# Patient Record
Sex: Male | Born: 1960 | Race: White | Hispanic: No | Marital: Married | Smoking: Current every day smoker
Health system: Southern US, Community
[De-identification: ages and names within clinical notes are randomized; demographics above are authoritative.]

## PROBLEM LIST (undated history)

## (undated) DIAGNOSIS — L570 Actinic keratosis: Secondary | ICD-10-CM

## (undated) DIAGNOSIS — K449 Diaphragmatic hernia without obstruction or gangrene: Secondary | ICD-10-CM

## (undated) DIAGNOSIS — K219 Gastro-esophageal reflux disease without esophagitis: Secondary | ICD-10-CM

## (undated) DIAGNOSIS — K648 Other hemorrhoids: Secondary | ICD-10-CM

## (undated) DIAGNOSIS — E785 Hyperlipidemia, unspecified: Secondary | ICD-10-CM

## (undated) DIAGNOSIS — K2970 Gastritis, unspecified, without bleeding: Secondary | ICD-10-CM

## (undated) DIAGNOSIS — T7840XA Allergy, unspecified, initial encounter: Secondary | ICD-10-CM

## (undated) DIAGNOSIS — F329 Major depressive disorder, single episode, unspecified: Secondary | ICD-10-CM

## (undated) DIAGNOSIS — N200 Calculus of kidney: Secondary | ICD-10-CM

## (undated) DIAGNOSIS — C44310 Basal cell carcinoma of skin of unspecified parts of face: Secondary | ICD-10-CM

## (undated) DIAGNOSIS — J449 Chronic obstructive pulmonary disease, unspecified: Secondary | ICD-10-CM

## (undated) DIAGNOSIS — F17200 Nicotine dependence, unspecified, uncomplicated: Secondary | ICD-10-CM

## (undated) DIAGNOSIS — M5020 Other cervical disc displacement, unspecified cervical region: Secondary | ICD-10-CM

## (undated) DIAGNOSIS — D126 Benign neoplasm of colon, unspecified: Secondary | ICD-10-CM

## (undated) DIAGNOSIS — J439 Emphysema, unspecified: Secondary | ICD-10-CM

## (undated) HISTORY — DX: Actinic keratosis: L57.0

## (undated) HISTORY — DX: Chronic obstructive pulmonary disease, unspecified: J44.9

## (undated) HISTORY — DX: Major depressive disorder, single episode, unspecified: F32.9

## (undated) HISTORY — DX: Basal cell carcinoma of skin of unspecified parts of face: C44.310

## (undated) HISTORY — PX: POLYPECTOMY: SHX149

## (undated) HISTORY — DX: Emphysema, unspecified: J43.9

## (undated) HISTORY — DX: Hyperlipidemia, unspecified: E78.5

## (undated) HISTORY — PX: MOHS SURGERY: SUR867

## (undated) HISTORY — DX: Nicotine dependence, unspecified, uncomplicated: F17.200

## (undated) HISTORY — DX: Calculus of kidney: N20.0

## (undated) HISTORY — DX: Gastritis, unspecified, without bleeding: K29.70

## (undated) HISTORY — DX: Benign neoplasm of colon, unspecified: D12.6

## (undated) HISTORY — DX: Gastro-esophageal reflux disease without esophagitis: K21.9

## (undated) HISTORY — PX: TONSILLECTOMY: SUR1361

## (undated) HISTORY — DX: Other cervical disc displacement, unspecified cervical region: M50.20

## (undated) HISTORY — DX: Other hemorrhoids: K64.8

## (undated) HISTORY — DX: Allergy, unspecified, initial encounter: T78.40XA

## (undated) HISTORY — DX: Diaphragmatic hernia without obstruction or gangrene: K44.9

---

## 2001-02-13 DIAGNOSIS — K2970 Gastritis, unspecified, without bleeding: Secondary | ICD-10-CM

## 2001-02-13 HISTORY — DX: Gastritis, unspecified, without bleeding: K29.70

## 2003-02-14 ENCOUNTER — Encounter (INDEPENDENT_AMBULATORY_CARE_PROVIDER_SITE_OTHER): Payer: Self-pay | Admitting: Gastroenterology

## 2003-02-14 HISTORY — PX: COLONOSCOPY: SHX174

## 2003-02-14 HISTORY — PX: ESOPHAGOGASTRODUODENOSCOPY: SHX1529

## 2005-05-26 ENCOUNTER — Ambulatory Visit: Payer: Self-pay | Admitting: Gastroenterology

## 2006-06-15 ENCOUNTER — Ambulatory Visit: Payer: Self-pay | Admitting: Gastroenterology

## 2006-08-30 ENCOUNTER — Ambulatory Visit: Payer: Self-pay | Admitting: Family Medicine

## 2007-05-01 ENCOUNTER — Ambulatory Visit: Payer: Self-pay | Admitting: Family Medicine

## 2007-05-01 DIAGNOSIS — F17200 Nicotine dependence, unspecified, uncomplicated: Secondary | ICD-10-CM

## 2007-05-01 DIAGNOSIS — H6120 Impacted cerumen, unspecified ear: Secondary | ICD-10-CM

## 2007-08-10 HISTORY — PX: COLONOSCOPY: SHX174

## 2007-09-29 ENCOUNTER — Ambulatory Visit: Payer: Self-pay | Admitting: Gastroenterology

## 2008-01-11 ENCOUNTER — Telehealth: Payer: Self-pay | Admitting: Gastroenterology

## 2008-02-07 DIAGNOSIS — D126 Benign neoplasm of colon, unspecified: Secondary | ICD-10-CM

## 2008-02-07 HISTORY — DX: Benign neoplasm of colon, unspecified: D12.6

## 2008-02-16 ENCOUNTER — Ambulatory Visit: Payer: Self-pay | Admitting: Gastroenterology

## 2008-02-29 ENCOUNTER — Telehealth: Payer: Self-pay | Admitting: Gastroenterology

## 2008-03-01 ENCOUNTER — Encounter: Payer: Self-pay | Admitting: Gastroenterology

## 2008-03-01 ENCOUNTER — Ambulatory Visit: Payer: Self-pay | Admitting: Gastroenterology

## 2008-03-01 LAB — HM COLONOSCOPY

## 2008-03-05 ENCOUNTER — Encounter: Payer: Self-pay | Admitting: Gastroenterology

## 2008-08-06 ENCOUNTER — Ambulatory Visit: Payer: Self-pay | Admitting: Family Medicine

## 2008-08-06 DIAGNOSIS — J069 Acute upper respiratory infection, unspecified: Secondary | ICD-10-CM | POA: Insufficient documentation

## 2008-08-11 ENCOUNTER — Telehealth: Payer: Self-pay | Admitting: Internal Medicine

## 2008-08-12 ENCOUNTER — Telehealth (INDEPENDENT_AMBULATORY_CARE_PROVIDER_SITE_OTHER): Payer: Self-pay | Admitting: Internal Medicine

## 2009-02-04 ENCOUNTER — Ambulatory Visit: Payer: Self-pay | Admitting: Family Medicine

## 2009-02-04 DIAGNOSIS — G4700 Insomnia, unspecified: Secondary | ICD-10-CM

## 2009-04-08 ENCOUNTER — Telehealth: Payer: Self-pay | Admitting: Gastroenterology

## 2009-05-01 ENCOUNTER — Telehealth: Payer: Self-pay | Admitting: Gastroenterology

## 2009-05-23 ENCOUNTER — Ambulatory Visit: Payer: Self-pay | Admitting: Gastroenterology

## 2009-05-23 DIAGNOSIS — Z860100 Personal history of colon polyps, unspecified: Secondary | ICD-10-CM | POA: Insufficient documentation

## 2009-05-23 DIAGNOSIS — K219 Gastro-esophageal reflux disease without esophagitis: Secondary | ICD-10-CM

## 2009-06-13 ENCOUNTER — Ambulatory Visit: Payer: Self-pay | Admitting: Family Medicine

## 2009-06-21 ENCOUNTER — Telehealth: Payer: Self-pay | Admitting: Family Medicine

## 2009-09-10 ENCOUNTER — Telehealth: Payer: Self-pay | Admitting: Gastroenterology

## 2009-10-08 ENCOUNTER — Ambulatory Visit: Payer: Self-pay | Admitting: Family Medicine

## 2009-10-08 DIAGNOSIS — F4321 Adjustment disorder with depressed mood: Secondary | ICD-10-CM | POA: Insufficient documentation

## 2009-10-08 LAB — CONVERTED CEMR LAB: Rapid Strep: NEGATIVE

## 2009-11-12 ENCOUNTER — Ambulatory Visit: Payer: Self-pay | Admitting: Family Medicine

## 2010-04-21 ENCOUNTER — Ambulatory Visit: Payer: Self-pay | Admitting: Family Medicine

## 2010-04-21 LAB — CONVERTED CEMR LAB
ALT: 19 units/L (ref 0–53)
AST: 20 units/L (ref 0–37)
Albumin: 3.8 g/dL (ref 3.5–5.2)
Basophils Relative: 0.6 % (ref 0.0–3.0)
CO2: 25 meq/L (ref 19–32)
Calcium: 9.3 mg/dL (ref 8.4–10.5)
Cholesterol: 176 mg/dL (ref 0–200)
Creatinine, Ser: 0.9 mg/dL (ref 0.4–1.5)
Eosinophils Relative: 2.7 % (ref 0.0–5.0)
GFR calc non Af Amer: 97 mL/min (ref >60)
Lymphocytes Relative: 27.4 % (ref 12.0–46.0)
Lymphs Abs: 2.4 10*3/uL (ref 0.7–4.0)
MCHC: 34.3 g/dL (ref 30.0–36.0)
Monocytes Absolute: 0.5 10*3/uL (ref 0.1–1.0)
Monocytes Relative: 6.2 % (ref 3.0–12.0)
Neutro Abs: 5.4 10*3/uL (ref 1.4–7.7)
Neutrophils Relative %: 63.1 % (ref 43.0–77.0)
PSA: 0.25 ng/mL (ref 0.10–4.00)
RBC: 4.6 M/uL (ref 4.22–5.81)
RDW: 13.4 % (ref 11.5–14.6)
TSH: 0.98 microintl units/mL (ref 0.35–5.50)
Total Bilirubin: 0.7 mg/dL (ref 0.3–1.2)
Total Protein: 6.5 g/dL (ref 6.0–8.3)

## 2010-04-28 ENCOUNTER — Ambulatory Visit: Payer: Self-pay | Admitting: Family Medicine

## 2010-06-25 ENCOUNTER — Telehealth: Payer: Self-pay | Admitting: Family Medicine

## 2010-07-06 ENCOUNTER — Telehealth: Payer: Self-pay | Admitting: Gastroenterology

## 2010-08-31 ENCOUNTER — Ambulatory Visit
Admission: RE | Admit: 2010-08-31 | Discharge: 2010-08-31 | Payer: Self-pay | Source: Home / Self Care | Attending: Gastroenterology | Admitting: Gastroenterology

## 2010-09-03 ENCOUNTER — Encounter: Payer: Self-pay | Admitting: Gastroenterology

## 2010-09-08 NOTE — Progress Notes (Signed)
Summary: Aciphex discount card   Phone Note Call from Patient Call back at Home Phone [phone]   Call For: Dr Russella Dar Summary of Call: Would like an aciphex discount card please.  Initial call taken by: Leanor Kail [facility],  September 10, 2009 1:49 PM  Follow-up for Phone Call        Pt would like Aciphex discount card. Card left up front for pt to pick up. Follow-up by: Christie Nottingham CMA [facility]),  September 10, 2009 1:59 PM
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
## 2010-09-08 NOTE — Progress Notes (Signed)
Summary: wants claritin called in  Phone Note Refill Request Message from:  wife  Refills Requested: Medication #1:  CLARITIN 10 MG TABS as needed Pt is asking that a script for this be sent to [facility].  He takes it every day and has a flex spending account that he would like to put it on.  Initial call taken by: Lowella Petties CMA, AAMA,  June 25, 2010 9:02 AM  Follow-up for Phone Call        no problem px written on EMR for call in  Follow-up by: Judith Part MD,  June 25, 2010 12:05 PM  Additional Follow-up for Phone Call Additional follow up Details #1::        Medication phoned to [facility] pharmacy as instructed. Patient notified as instructed by telephone v/m. Lewanda Rife LPN  June 25, 2010 12:43 PM     New/Updated Medications: CLARITIN 10 MG TABS (LORATADINE) 1 by mouth once daily as needed allergy symptoms Prescriptions: CLARITIN 10 MG TABS (LORATADINE) 1 by mouth once daily as needed allergy symptoms  #30 x 11   Entered and Authorized by:   Judith Part MD   Signed by:   Lewanda Rife LPN on 78/46/9629   Method used:   Telephoned to ...       [facility]  [address] (retail[facility]       [address]       [place], [place]  [address]       Ph: [phone] or [phone]       Fax: [phone]   RxID:   [id number]

## 2010-09-08 NOTE — Progress Notes (Signed)
Summary: Medication  Medications Added ACIPHEX 20 MG  TBEC (RABEPRAZOLE SODIUM) Take 1 tablet by mouth once a day       Phone Note Call from Patient Call back at [phone]   Caller: Patient Reason for Call: Talk to Nurse Summary of Call: Pts wife called about refill on a Aciphex, looks like in the chart it was denied becuase he needs an appt so she wants to discuss getting a amall refill untill he can come in for appt Initial call taken by: Raechel Chute,  July 06, 2010 1:54 PM  Follow-up for Phone Call        Rx was sent to pts pharmacy and pt scheduled a appt for 08/31/10 at 2:45pm.  Follow-up by: Christie Nottingham CMA [facility]),  July 06, 2010 2:24 PM    New/Updated Medications: ACIPHEX 20 MG  TBEC (RABEPRAZOLE SODIUM) Take 1 tablet by mouth once a day Prescriptions: ACIPHEX 20 MG  TBEC (RABEPRAZOLE SODIUM) Take 1 tablet by mouth once a day  #30 x 1   Entered by:   Christie Nottingham CMA (AAMA)   Authorized by:   Meryl Dare MD [facility]   Signed by:   Christie Nottingham CMA (AAMA) on 07/06/2010   Method used:   [facility]        [facility]  [address] (ret[facility]       [address]       [place], [place]  [address]       Ph: [phone] or [phone]       Fax: [phone]   RxID:   [id number]
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
## 2010-09-08 NOTE — Assessment & Plan Note (Signed)
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 Bili:  0.7 (04/21/2010 8:53:45 AM)Alk Phos:  96 (04/21/2010 8:53:45 AM)  does take fish oil -- two times a day    psa nl at spent 25 minutes face to face time with pt , over 50% of which was spent on counseling and coordination of care   Td 04  flu shot - will get at work   some wax in ears -- may aff his hearing   Allergies: 1)  Aspirin 2)  Codeine Sulfate (Codeine Sulfate)  Past History:  Past Surgical History: Last updated: 04/19/2007 EGD- esophagitis, gastritis, duodenitis without hemorrhage 02/14/03 Colonoscopy, polyps, ext. hemorrhoids 02/14/03  Family History: Last updated: 04/19/2007 Father: Alive, elevated BP, gout, GERD, colon polyps Mother: Alive- breast cancer Siblings: 2 Brothers, L & W, one Colon polyps, earyl glaucoma CV + PGF - MI deceased (14's), MGF - Mini  CVA's DM - none Prostate cancer - none Breast cancer + Mom Colon cancer - none Depression - none ETOH/Drug Abuse - none  Social History: Last updated: 10/08/2009 Marital Status: Married Children: 0 Occupation: Best boy Drug Use - no Patient currently smokes. about a pack per day  Alcohol Use - no  Risk Factors: Smoking Status: current (05/23/2009) Packs/Day: 1/2 (08/06/2008)  Past Medical History: Adenomatous colon polyp 02/2008 esophagitis, gastritis--02/13/01 Hiatal hernia GERD Hemorrhoids depression- situational  basal cell lesions face - Mohs proceedure  AKs    derm  Review of Systems General:  Denies fatigue, loss of appetite, and malaise. Eyes:  Denies blurring and eye irritation. CV:  Denies chest pain or discomfort and lightheadness. Resp:  Denies cough, shortness of breath, and wheezing. GI:  Denies abdominal pain, change in bowel habits, indigestion, and nausea. GU:  Denies dysuria, nocturia, urinary frequency, and urinary hesitancy. MS:  Denies joint pain, joint redness, and joint swelling. Derm:  Denies itching, lesion(s), poor wound healing, and rash. Neuro:  Denies numbness and tingling. Psych:  Denies anxiety and depression. Endo:  Denies excessive thirst and excessive urination. Heme:  Denies abnormal bruising and bleeding.  Physical Exam  General:  Well-developed,well-nourished,in no acute distress; alert,appropriate and cooperative throughout examination Head:  normocephalic, atraumatic, and no abnormalities observed.   Eyes:  vision grossly intact, pupils equal, pupils round, and pupils reactive to light.   Ears:  bilat dry cerumen impaction- is deep in ears  some decreased hearing  Nose:  no nasal discharge.   Mouth:  pharynx pink and moist.   Neck:  supple with full rom and no masses or thyromegally, no JVD or carotid bruit  Chest Wall:  No deformities, masses, tenderness or gynecomastia noted. Lungs:  Normal  respiratory effort, chest expands symmetrically. Lungs are clear to auscultation, no crackles or wheezes. Heart:  normal rate, regular rhythm, and no murmur.   Abdomen:  Bowel sounds positive,abdomen soft and non-tender without masses, organomegaly or hernias noted. no renal bruits  Rectal:  No external abnormalities noted. Normal sphincter tone. No rectal masses or tenderness. Prostate:  Prostate gland firm and smooth, no enlargement, nodularity, tenderness, mass, asymmetry or induration. Msk:  No deformity or scoliosis noted of thoracic or lumbar spine.   no acute joint changes  Pulses:  R and L carotid,radial,femoral,dorsalis pedis and posterior tibial pulses are full and equal bilaterally Extremities:  No clubbing, cyanosis, edema, or deformity noted with normal full range of motion of all joints.   Neurologic:  sensation intact to light touch, gait normal, and DTRs symmetrical and normal.   Skin:  Intact without suspicious lesions or rashes Cervical Nodes:  No lymphadenopathy noted Inguinal Nodes:  No significant adenopathy Psych:  normal affect, talkative and pleasant    Impression & Recommendations:  Problem # 1:  HEALTH MAINTENANCE EXAM (ICD-V70.0) Assessment Comment Only reviewed health habits including diet, exercise and skin cancer prevention reviewed health maintenance list and family history disc need for smoking cessation  disc labs in detail - continue fish oil and exercise for low HDL  will get flu shot at work  declines pneumovax but will think about it   Problem # 2:  SPECIAL SCREENING MALIGNANT NEOPLASM OF PROSTATE (ICD-V76.44) Assessment: Comment Only DRE today nl psa no symptoms  Problem # 3:  PERSONAL HX COLONIC POLYPS (ICD-V12.72) Assessment: Unchanged is up to date colonoscopy  Problem # 4:  DEPRESSION, SITUATIONAL (ICD-309.0) Assessment: Improved much improved with less stress   Problem # 5:  TOBACCO ABUSE (ICD-305.1) Assessment: Unchanged discussed  in detail risks of smoking, and possible outcomes including COPD, vascular dz, cancer and also respiratory infections/sinus problems  pt voiced understanding hopes to quit with nicotine replacement- has not set date yet  Problem # 6:  CERUMEN IMPACTION, BILATERAL (ICD-380.4) Assessment: New dry and mod amt  suspect this is affecting his hearing  will use debrox at home as directed twice weekly for 2 weeks and then f/u for ear flush  Complete Medication List: 1)  Aciphex 20 Mg Tbec (Rabeprazole sodium) .... Take 1 tablet by mouth once a day 2)  Claritin 10 Mg Tabs (Loratadine) .... As needed 3)  Fish Oil Oil (Fish oil) .... One daily 4)  Ester-c Tabs (Bioflavonoid products) .... One daily 5)  Acidophilus Caps (Lactobacillus) .... One daily 6)  Multivitamins Tabs (Multiple vitamin) .... One daily 7)  Citracal Plus Tabs (Multiple minerals-vitamins) .... Otc as directed. 8)  Prozac 10 Mg Caps (Fluoxetine hcl) .... 2 by mouth once daily  Patient Instructions: 1)  you can raise your HDL (good cholesterol) by increasing exercise and eating omega 3 fatty acid supplement like fish oil or flax seed oil over the counter 2)  you can lower LDL (bad cholesterol) by limiting saturated  fats in diet like red meat, fried foods, egg yolks, fatty breakfast meats, high fat dairy products and shellfish  3)  work on quitting smoking  4)  please consider pneumonia vaccine -- let us know if you want one (smoking puts you at high risk for pneumonia)  5)  make sure to get your flu shot at work  6)  get a product called debrox over the counter -- and use it as directed in ears twice a week -- then follow up here in 2 weeks to get ears flushed   Current Allergies (reviewed today): ASPIRIN CODEINE SULFATE (CODEINE SULFATE)

## 2010-09-10 NOTE — Assessment & Plan Note (Signed)
Summary: GERD yearly f/u/all   History of Present Illness Visit Type: Follow-up Visit Primary GI MD: Elie Goody MD [facility] Primary Provider: Roxy Manns, MD  Requesting Provider: n/a Chief Complaint: GERD: med refills Aciphex; patient having no problems while on medication History of Present Illness:   [name] returns for followup of GERD. He states his reflux symptoms remain under very good control as long as he avoids tomato-based products. He had a tomato-based soup last week and had nighttime reflux.   GI Review of Systems      Denies abdominal pain, acid reflux, belching, bloating, chest pain, dysphagia with liquids, dysphagia with solids, heartburn, loss of appetite, nausea, vomiting, vomiting blood, weight loss, and  weight gain.        Denies anal fissure, black tarry stools, change in bowel habit, constipation, diarrhea, diverticulosis, fecal incontinence, heme positive stool, hemorrhoids, irritable bowel syndrome, jaundice, light color stool, liver problems, rectal bleeding, and  rectal pain.   Current Medications (verified): 1)  Aciphex 20 Mg  Tbec (Rabeprazole Sodium) .... Take 1 Tablet By Mouth Once A Day 2)  Claritin 10 Mg Tabs (Loratadine) .Marland Kitchen.. 1 By Mouth Once Daily As Needed Allergy Symptoms 3)  Fish Oil   Oil (Fish Oil) .... One Daily 4)  Ester-C  Tabs (Bioflavonoid Products) .... One Daily 5)  Acidophilus  Caps (Lactobacillus) .... One Daily 6)  Multivitamins   Tabs (Multiple Vitamin) .... One Daily 7)  Citracal Plus  Tabs (Multiple Minerals-Vitamins) .... Otc As Directed. 8)  Prozac 10 Mg Caps (Fluoxetine Hcl) .... 2 By Mouth Once Daily  Allergies (verified): 1)  Aspirin 2)  Codeine Sulfate (Codeine Sulfate)  Past History:  Past Medical History: Adenomatous colon polyp 02/2008 esophagitis, gastritis 02/13/01 Hiatal hernia GERD Hemorrhoids depression- situational  basal cell lesions face - Mohs proceedure  AKs  derm  Past Surgical  History: Reviewed history from 04/19/2007 and no changes required. EGD- esophagitis, gastritis, duodenitis without hemorrhage 02/14/03 Colonoscopy, polyps, ext. hemorrhoids 02/14/03  Family History: Reviewed history from 04/19/2007 and no changes required. Father: Alive, elevated BP, gout, GERD, colon polyps Mother: Alive- breast cancer Siblings: 2 Brothers, L & W, one Colon polyps, earyl glaucoma CV + PGF - MI deceased (41's), MGF - Mini CVA's DM - none Prostate cancer - none Breast cancer + Mom Colon cancer - none Depression - none ETOH/Drug Abuse - none  Social History: Reviewed history from 10/08/2009 and no changes required. Marital Status: Married Children: 0 Occupation: Best boy Drug Use - no Patient currently smokes. about a pack per day  Alcohol Use - no  Review of Systems       The pertinent positives and negatives are noted as above and in the HPI. All other ROS were reviewed and were negative.   Vital Signs:  Patient profile:   50 year old male Height:      68 inches Weight:      151.38 pounds BMI:     23.10 Pulse rate:   64 / minute Pulse rhythm:   regular BP sitting:   104 / 68  (left arm) Cuff size:   regular  Vitals Entered By: June McMurray CMA Duncan Dull) (August 31, 2010 2:22 PM)  Physical Exam  General:  Well developed, well nourished, no acute distress. Head:  Normocephalic and atraumatic. Eyes:  PERRLA, no icterus. Mouth:  No deformity or lesions, dentition normal. Lungs:  Clear throughout to auscultation. Heart:  Regular rate and rhythm; no murmurs, rubs,  or bruits. Abdomen:  Soft, nontender and nondistended. No masses, hepatosplenomegaly or hernias noted. Normal bowel sounds. Psych:  Alert and cooperative. Normal mood and affect.  Impression & Recommendations:  Problem # 1:  GERD (ICD-530.81) Continue all standard antireflux measures and AcipHex 20mg  po qam. Consider changing to a generic proton pump inhibitor if  It is less expensive for him. He will check with his with his pharmacy benefit, and notify us.  Problem # 2:  PERSONAL HX COLONIC POLYPS (ICD-V12.72) Prior history of adenomatous polyps diagnosed in July 2009. Surveillance colonoscopy recommended July 2014.  Patient Instructions: 1)  Pick up your prescription from your pharmacy.  2)  Please schedule a follow-up appointment in 1 year. 3)  The medication list was reviewed and reconciled.  All changed / newly prescribed medications were explained.  A complete medication list was provided to the patient / caregiver.  Prescriptions: ACIPHEX 20 MG  TBEC (RABEPRAZOLE SODIUM) Take 1 tablet by mouth once a day  #34 x 11   Entered by:   Christie Nottingham CMA (AAMA)   Authorized by:   Meryl Dare MD [facility]   Signed by:   Christie Nottingham CMA (AAMA) on 08/31/2010   Method used:   [facility]        [facility] [facility]       [address]       [place], [place]  [address]       Ph: [phone] or [phone]       Fax: [phone]   RxID:   [id number]

## 2010-09-16 NOTE — Medication Information (Signed)
Summary: Approved/[employer]  Approved/[employer]   Imported By: Lester Gage 09/10/2010 10:49:18  _____________________________________________________________________  External Attachment:    Type:   Image     Comment:   External Document

## 2010-09-28 ENCOUNTER — Ambulatory Visit (INDEPENDENT_AMBULATORY_CARE_PROVIDER_SITE_OTHER): Payer: 59 | Admitting: Family Medicine

## 2010-09-28 ENCOUNTER — Encounter: Payer: Self-pay | Admitting: Family Medicine

## 2010-09-28 DIAGNOSIS — J019 Acute sinusitis, unspecified: Secondary | ICD-10-CM

## 2010-10-06 NOTE — Letter (Signed)
Summary: Out of Work  [facility]  [address] [place], [place] [address]   Phone: [phone]  Fax: [phone]    September 28, 2010   Employee:  NIKOLAY DEMETRIOU    To Whom It May Concern:   For Medical reasons, please excuse the above named employee from work for the following dates:  Start:   today  End:   09/30/10 (back to work on 10/01/10)  If you need additional information, please feel free to contact our office.         Sincerely,    Crawford Givens MD

## 2010-10-06 NOTE — Assessment & Plan Note (Signed)
Summary: COLD/CLE    [employer]   Vital Signs:  Patient profile:   50 year old male Height:      68 inches Weight:      150.50 pounds BMI:     22.97 O2 Sat:      98 % on Room air Temp:     97.9 degrees F oral Pulse rate:   84 / minute Pulse rhythm:   regular BP sitting:   104 / 72  (left arm) Cuff size:   regular  Vitals Entered By: Delilah Shan CMA (AAMA) (September 28, 2010 4:07 PM)  O2 Flow:  Room air CC: Cold   History of Present Illness: Sinus pressure and congestion.  Pain across forehead.  Getting worse. Smoking 1/2ppd, less the last few days.  Has felt hot.  Sneezing.  Occ sputum, esp in AM.  Yellow sputum, changed from baseline.  Sick contacts.   Allergies: 1)  Aspirin 2)  Codeine Sulfate (Codeine Sulfate)  Social History: Marital Status: Married Children: 0 Occupation: Best boy Drug Use - no Patient currently smokes. about a pack per day  Alcohol Use - no  Review of Systems       See HPI.  Otherwise negative.    Physical Exam  General:  GEN: nad, alert and oriented HEENT: mucous membranes moist, TM w/o erythema, nasal epithelium injected, OP with cobblestoning NECK: supple w/o LA CV: rrr. PULM: ctab, no inc wob ABD: soft, +bs EXT: no edema  max sinus tender to palpation bilaterally   Impression & Recommendations:  Problem # 1:  SINUSITIS - ACUTE-NOS (ICD-461.9) max sinusitis, bilateral.  Start amoxil.  I encouraged patient to stop smoking.  He'll consider. Supportive tx o/w.  follow up as needed.  His updated medication list for this problem includes:    Amoxicillin 875 Mg Tabs (Amoxicillin) .Marland Kitchen... 1 by mouth two times a day  Orders: Prescription Created Electronically [phone])  Complete Medication List: 1)  Aciphex 20 Mg Tbec (Rabeprazole sodium) .... Take 1 tablet by mouth once a day 2)  Claritin 10 Mg Tabs (Loratadine) .Marland Kitchen.. 1 by mouth once daily as needed allergy symptoms 3)  Fish Oil Oil (Fish oil) .... One daily 4)   Ester-c Tabs (Bioflavonoid products) .... One daily 5)  Acidophilus Caps (Lactobacillus) .... One daily 6)  Multivitamins Tabs (Multiple vitamin) .... One daily 7)  Citracal Plus Tabs (Multiple minerals-vitamins) .... Otc as directed. 8)  Prozac 10 Mg Caps (Fluoxetine hcl) .... 2 by mouth once daily 9)  Amoxicillin 875 Mg Tabs (Amoxicillin) .Marland Kitchen.. 1 by mouth two times a day  Patient Instructions: 1)  Get plenty of rest, drink lots of clear liquids, and use Tylenol for fever and comfort.  Start the antibiotics today.  Take care.  Prescriptions: AMOXICILLIN 875 MG TABS (AMOXICILLIN) 1 by mouth two times a day  #20 x 0   Entered and Authorized by:   Crawford Givens MD   Signed by:   Crawford Givens MD on 09/28/2010   Method used:   [facility]        [facility] [id number]       [address]       [place], [place]  [address]       Ph: [phone] or [phone]       Fax: [phone]   RxID:   [id number]    Orders Added: 1)  Est. Patient Level III [03474] 2)  Prescription Created Electronically [phone]  Current Allergies (reviewed today): ASPIRIN CODEINE SULFATE (CODEINE SULFATE)

## 2010-11-16 ENCOUNTER — Other Ambulatory Visit: Payer: Self-pay | Admitting: *Deleted

## 2010-11-16 ENCOUNTER — Other Ambulatory Visit: Payer: Self-pay | Admitting: Family Medicine

## 2010-11-16 DIAGNOSIS — F341 Dysthymic disorder: Secondary | ICD-10-CM

## 2010-11-17 MED ORDER — FLUOXETINE HCL 10 MG PO CAPS: 10.0000 mg | ORAL_CAPSULE | Freq: Every day | ORAL | Status: DC

## 2010-11-17 NOTE — Telephone Encounter (Signed)
Spoke with Michael [name] at [facility] and pharmacy requested Prozac 10mg  taking 2 capsules by mouth every day. Med was sent in #60 with 11 refills  11/16/10. This was the correct instructions for the pt.

## 2010-12-17 ENCOUNTER — Encounter: Payer: Self-pay | Admitting: Family Medicine

## 2010-12-18 ENCOUNTER — Encounter: Payer: Self-pay | Admitting: Family Medicine

## 2010-12-18 ENCOUNTER — Ambulatory Visit (INDEPENDENT_AMBULATORY_CARE_PROVIDER_SITE_OTHER): Payer: BC Managed Care – PPO | Admitting: Family Medicine

## 2010-12-18 ENCOUNTER — Encounter: Payer: Self-pay | Admitting: *Deleted

## 2010-12-18 VITALS — BP 122/74 | HR 80 | Temp 97.6°F | Ht 71.0 in | Wt 154.0 lb

## 2010-12-18 DIAGNOSIS — J019 Acute sinusitis, unspecified: Secondary | ICD-10-CM

## 2010-12-18 MED ORDER — AMOXICILLIN 875 MG PO TABS: 875.0000 mg | ORAL_TABLET | Freq: Two times a day (BID) | ORAL | Status: AC

## 2010-12-18 MED ORDER — GUAIFENESIN-CODEINE 100-10 MG/5ML PO SYRP: 5.0000 mL | ORAL_SOLUTION | Freq: Every evening | ORAL | Status: DC | PRN

## 2010-12-18 NOTE — Progress Notes (Signed)
  Subjective:    Patient ID: Michael [name], male    DOB: 05/31/1961, 50 y.o.   MRN: [id number]  HPI CC: sinus congestion  2d h/o sinus congestion, RN, temperature at home, fevers/chills.  Clear nasal sputum.  + cough, dry.  Subjective fever last night.  + pressure forehead.  + more weak.  Tried alka seltzer plus and tylenol cold.  No abd pain, n/v, ear pain, tooth pain.  No rashes, myalgias or body aches.  No sick contacts at home.  Smoking 1/2 ppd.  No h/o asthma or COPD.  Review of Systems Per HPI    Objective:   Physical Exam  Nursing note and vitals reviewed. Constitutional: He appears well-developed and well-nourished. No distress.       Acutely congested  HENT:  Head: Normocephalic and atraumatic.  Right Ear: Tympanic membrane, external ear and ear canal normal.  Left Ear: Tympanic membrane, external ear and ear canal normal.  Nose: Rhinorrhea present. No mucosal edema. Right sinus exhibits frontal sinus tenderness. Right sinus exhibits no maxillary sinus tenderness. Left sinus exhibits frontal sinus tenderness. Left sinus exhibits no maxillary sinus tenderness.  Mouth/Throat: Posterior oropharyngeal edema and posterior oropharyngeal erythema present. No oropharyngeal exudate or tonsillar abscesses.       Cerumen in canals bilaterally  Eyes: Conjunctivae and EOM are normal. Pupils are equal, round, and reactive to light. No scleral icterus.  Neck: Normal range of motion. Neck supple. No thyromegaly present.  Cardiovascular: Normal rate, regular rhythm, normal heart sounds and intact distal pulses.   No murmur heard. Pulmonary/Chest: Effort normal and breath sounds normal. No respiratory distress. He has no wheezes. He has no rales.  Lymphadenopathy:    He has no cervical adenopathy.  Skin: Skin is warm and dry. No rash noted.          Assessment & Plan:

## 2010-12-18 NOTE — Assessment & Plan Note (Addendum)
Acute sinusitis.  Smoker Possible bacterial component. Treat supportively as per instructions. Amoxicillin to cover sinusitis. cheratussin for cough. Encouraged smoking cessation.

## 2010-12-18 NOTE — Patient Instructions (Signed)
You have a sinus infection. Take medicine as prescribed: amoxicillin twice daily for 10 days.  cheratussin for cough at night. Push fluids and plenty of rest. Nasal saline irrigation or neti pot to help drain sinuses. May use simple mucinex with plenty of fluid to help mobilize mucous. Return if fever >101.5, trouble opening/closing mouth, difficulty swallowing, or worsening.

## 2010-12-22 NOTE — Assessment & Plan Note (Signed)
[facility]                         GASTROENTEROLOGY OFFICE NOTE   NAME:Michael [name], Michael [name]                      MRN:          [id number]  DATE:09/29/2007                            DOB:          19-Mar-1961    Mr. [name] is a nice 50 year old white male, former patient of Dr. Victorino Dike, with a history of GERD and colon polyps.  He has a family  history of colon polyps and underwent colonoscopy by Dr. Victorino Dike on  February 14, 2003.  Multiple diminutive polyps were removed, but not  submitted to pathology.  External hemorrhoids were also noted.  He  underwent upper endoscopy on the same day, showing esophagitis,  gastritis, duodenitis and a hiatal hernia.  His reflux symptoms are  under excellent control on Aciphex.  He has no dysphagia, odynophagia,  abdominal pain, weight-loss, change in bowel habits, melena or  hematochezia.  He ran out of Aciphex about two weeks ago and his reflux  symptoms have been very active since then.   CURRENT MEDICATIONS:  Tylenol p.r.n.   MEDICATION ALLERGIES:  CODEINE, leading to swelling.   PHYSICAL EXAM:  No acute distress.  Weight 157.4 pounds, blood pressure is 100/64, pulse 80 and regular.  HEENT EXAM:  Anicteric sclerae.  Oropharynx clear.  CHEST:  Clear to auscultation bilaterally.  CARDIAC:  Regular rate and rhythm without murmurs appreciated.  Abdomen  is soft and nontender with normoactive bowel sounds, no palpable  organomegaly, masses or hernias.   ASSESSMENT AND PLAN:  1. GERD.  Written literature on all standard antireflux measures given      to the patient.  Renew Aciphex 20 mg p.o. q.a.m. for one year.      Return office visit one year.  2. Personal history of colon polyps - type unknown.  Family history of      colon polyps.  Plan for surveillance colonoscopy in July 2009.     Venita Lick. Russella Dar, MD, [facility]  Electronically Signed    MTS/MedQ  DD: 09/29/2007  DT: 09/29/2007  Job #: [id number]
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
## 2010-12-25 NOTE — Assessment & Plan Note (Signed)
[facility]                           GASTROENTEROLOGY OFFICE NOTE   NAME:[name], Michael DECESARE                      MRN:          [id number]  DATE:06/15/2006                            DOB:          07-31-1961    The patient comes in the office on November 7. Denies any GI symptoms. Just  needs some refills on his Aciphex. Says he has been doing great. He has  known hiatal hernia and some reflux symptoms that are Helicobacter pylori  positive. Says he has been doing well. He works at the [employer] and seems happy.   His physical examination was unremarkable. His weight was 157, blood  pressure 114/60, pulse 80 and regular __________ all unremarkable.   IMPRESSION:  Gastroesophageal reflux disease. Controlled with protein-pump  inhibitor, Aciphex.   RECOMMENDATIONS:  Continue on Aciphex. I gave him samples of this as well.     Ulyess Mort, MD  Electronically Signed    SML/MedQ  DD: 06/15/2006  DT: 06/16/2006  Job #: [id number]

## 2011-09-03 ENCOUNTER — Other Ambulatory Visit: Payer: Self-pay | Admitting: Gastroenterology

## 2011-09-10 ENCOUNTER — Other Ambulatory Visit: Payer: Self-pay | Admitting: Gastroenterology

## 2011-09-10 MED ORDER — RABEPRAZOLE SODIUM 20 MG PO TBEC: 20.0000 mg | DELAYED_RELEASE_TABLET | Freq: Every day | ORAL | Status: DC

## 2011-09-10 NOTE — Telephone Encounter (Signed)
Patient states Aciphex is too expensive and needs a alternate medication but Aciphex does really work well to control his symptoms. Told patient that we do have a Aciphex discount card if they would like to pick it up. Pt agreed and will pick this up on Monday.

## 2011-09-29 ENCOUNTER — Ambulatory Visit (INDEPENDENT_AMBULATORY_CARE_PROVIDER_SITE_OTHER): Payer: BC Managed Care – PPO | Admitting: Gastroenterology

## 2011-09-29 ENCOUNTER — Encounter: Payer: Self-pay | Admitting: Gastroenterology

## 2011-09-29 VITALS — BP 92/60 | HR 68 | Ht 71.0 in | Wt 157.6 lb

## 2011-09-29 DIAGNOSIS — K219 Gastro-esophageal reflux disease without esophagitis: Secondary | ICD-10-CM

## 2011-09-29 DIAGNOSIS — R197 Diarrhea, unspecified: Secondary | ICD-10-CM

## 2011-09-29 DIAGNOSIS — R198 Other specified symptoms and signs involving the digestive system and abdomen: Secondary | ICD-10-CM

## 2011-09-29 MED ORDER — RABEPRAZOLE SODIUM 20 MG PO TBEC: 20.0000 mg | DELAYED_RELEASE_TABLET | Freq: Every day | ORAL | Status: DC

## 2011-09-29 NOTE — Progress Notes (Signed)
History of Present Illness: This is a 51 year old male with a history of GERD is well controlled on AcipHex. He relates a change in bowel habits with intermittent looser stools over the past 2 months. He relates no medication changes and no diet changes. Colonoscopy in July 2009 showed small adenomatous colon polyps and internal hemorrhoids. Denies weight loss, abdominal pain, constipation, change in stool caliber, melena, hematochezia, nausea, vomiting, dysphagia, reflux symptoms, chest pain.  Current Medications, Allergies, Past Medical History, Past Surgical History, Family History and Social History were reviewed in [employer] record.  Physical Exam: General: Well developed , well nourished, no acute distress Head: Normocephalic and atraumatic Eyes:  sclerae anicteric, EOMI Ears: Normal auditory acuity Mouth: No deformity or lesions Lungs: Clear throughout to auscultation Heart: Regular rate and rhythm; no murmurs, rubs or bruits Abdomen: Soft, non tender and non distended. No masses, hepatosplenomegaly or hernias noted. Normal Bowel sounds Musculoskeletal: Symmetrical with no gross deformities  Pulses:  Normal pulses noted Extremities: No clubbing, cyanosis, edema or deformities noted Neurological: Alert oriented x 4, grossly nonfocal Psychological:  Alert and cooperative. Normal mood and affect  Assessment and Recommendations:  1. GERD. Well controlled on AcipHex. Renew AcipHex 20 mg daily and continues to reflux measures.  2. Change in bowel habits with looser stool. He is advised to try avoiding milk products, high fat foods, fried and greasy foods, sodas and sweets. If he cannot clearly determine dietary cause is advised to return for further evaluation. Colonoscopy performed in 2009.  3. Personal history of adenomatous colon polyps. Surveillance colonoscopy recommended July 2014.

## 2011-09-29 NOTE — Patient Instructions (Addendum)
.  You have been given a separate informational sheet regarding your tobacco use, the importance of quitting and local resources to help you quit.  Your prescription for Aciphex has been sent to your pharmacy.  Please avoid Milk or milk products, high fat foods, sodas, and teas. If this does not help your diarrhea symptoms then call back to schedule a follow visit in 1-2 months. Otherwise we will see you in a year. cc: Roxy Manns, MD

## 2011-10-19 ENCOUNTER — Other Ambulatory Visit: Payer: Self-pay | Admitting: Gastroenterology

## 2011-11-04 ENCOUNTER — Other Ambulatory Visit: Payer: Self-pay | Admitting: Family Medicine

## 2011-11-04 NOTE — Telephone Encounter (Signed)
He needs f/u with me , please schedule  Will refill electronically

## 2011-11-04 NOTE — Telephone Encounter (Signed)
Refill error appeared in my in basket. I called [facility] and pharmacist said they did not receive rx for fluoxetine 10 mg. I called Fluoxetine 10 mg # 60 x 1 with instructions to take 2 capsules by mouth every day. I did not call pt to schedule appt.

## 2011-11-08 NOTE — Telephone Encounter (Signed)
Left message on cell phone voicemail for patient to call and schedule f/u appt.

## 2011-12-20 ENCOUNTER — Other Ambulatory Visit: Payer: Self-pay | Admitting: Family Medicine

## 2011-12-20 NOTE — Telephone Encounter (Signed)
Please schedule f/u with me , I think it has been a while  Will refill electronically

## 2011-12-20 NOTE — Telephone Encounter (Signed)
Left message on cell phone voicemail advising patient to call and scheduled f/u appt with Dr. Milinda Antis.  Advised that Rx was sent to the pharmacy.

## 2012-01-06 ENCOUNTER — Telehealth: Payer: Self-pay | Admitting: Family Medicine

## 2012-01-06 NOTE — Telephone Encounter (Signed)
That is fine with me but you will have to check with Dr Reece Agar

## 2012-01-06 NOTE — Telephone Encounter (Signed)
Patient needs Med Refills, but he prefers to have a male doctor and he liked the last doctor he saw here, which was Dr. Sharen Hones.  So he would like to change his PCP to Dr. Sharen Hones.  Please advise as to your wishes on this.

## 2012-01-06 NOTE — Telephone Encounter (Signed)
Fine by me. May place him with me.  Looks like needs office visit for med refill and/or physical

## 2012-01-07 NOTE — Telephone Encounter (Signed)
Message left advising patient to schedule appt for med refill/CPE. Changed PCP in system.

## 2012-01-10 ENCOUNTER — Other Ambulatory Visit: Payer: Self-pay | Admitting: Family Medicine

## 2012-01-10 NOTE — Telephone Encounter (Signed)
Electronic refill request

## 2012-01-12 ENCOUNTER — Encounter: Payer: BC Managed Care – PPO | Admitting: Family Medicine

## 2012-01-28 ENCOUNTER — Ambulatory Visit (INDEPENDENT_AMBULATORY_CARE_PROVIDER_SITE_OTHER): Payer: BC Managed Care – PPO | Admitting: Family Medicine

## 2012-01-28 ENCOUNTER — Encounter: Payer: Self-pay | Admitting: Family Medicine

## 2012-01-28 VITALS — BP 108/87 | HR 69 | Temp 97.7°F | Ht 70.0 in | Wt 153.5 lb

## 2012-01-28 DIAGNOSIS — S139XXA Sprain of joints and ligaments of unspecified parts of neck, initial encounter: Secondary | ICD-10-CM

## 2012-01-28 DIAGNOSIS — F17200 Nicotine dependence, unspecified, uncomplicated: Secondary | ICD-10-CM

## 2012-01-28 DIAGNOSIS — F4321 Adjustment disorder with depressed mood: Secondary | ICD-10-CM

## 2012-01-28 DIAGNOSIS — Z0000 Encounter for general adult medical examination without abnormal findings: Secondary | ICD-10-CM | POA: Insufficient documentation

## 2012-01-28 DIAGNOSIS — S161XXA Strain of muscle, fascia and tendon at neck level, initial encounter: Secondary | ICD-10-CM

## 2012-01-28 LAB — BASIC METABOLIC PANEL
CO2: 26 mEq/L (ref 19–32)
Calcium: 9.5 mg/dL (ref 8.4–10.5)
Glucose, Bld: 90 mg/dL (ref 70–99)
Sodium: 139 mEq/L (ref 135–145)

## 2012-01-28 LAB — LIPID PANEL: HDL: 41.4 mg/dL (ref >39.00)

## 2012-01-28 MED ORDER — FLUOXETINE HCL 20 MG PO CAPS: 20.0000 mg | ORAL_CAPSULE | Freq: Every day | ORAL | Status: DC

## 2012-01-28 MED ORDER — CYCLOBENZAPRINE HCL 5 MG PO TABS: 5.0000 mg | ORAL_TABLET | Freq: Two times a day (BID) | ORAL | Status: AC | PRN

## 2012-01-28 NOTE — Assessment & Plan Note (Signed)
Continue prozac per pt preference.

## 2012-01-28 NOTE — Assessment & Plan Note (Signed)
Preventative protocols reviewed and updated unless pt declined. Declines prostate screening this year. Discussed healthy diet and lifestyle. Encouraged smoking cessation.

## 2012-01-28 NOTE — Progress Notes (Addendum)
Subjective:    Patient ID: Michael [name], male    DOB: 12/26/1960, 51 y.o.   MRN: [id number]  HPI CC: med refill  Here for med refill (prozac) but declines physical - actually after discussing, would like CPE.  aciphex filled by GI.  Depression - on prozac 20mg  daily for several years, prior was started 2/2 old job that was stressful.  Would like to continue med for now, not interested in backing off.  Pulled left neck muscle 3 d ago, slowly resolving.  Has been using heating pad and tylenol which has helped.  Smoking - 1 ppd.  Has [place] quitline info.    Preventative: Colonoscopy in July 2009 showed small adenomatous colon polyps and internal hemorrhoids Prostate - last screen thinks 04/2010.  Discussed screening - would like to defer this year. Will be due for tetanus next year. Takes flu shot yearly.  Caffeine: 1 cup coffee/dya, Dr Reino Kent throughout the day Lives with wife Occupation: HVAC Activity: work, Armed forces training and education officer, mows yard Diet: good water, fruits/vegetables daily  Medications and allergies reviewed and updated in chart.  Past histories reviewed and updated if relevant as below. Patient Active Problem List  Diagnosis  . ANXIETY DEPRESSION  . TOBACCO ABUSE  . DEPRESSION, SITUATIONAL  . Impacted Cerumen  . GERD  . INSOMNIA  . PERSONAL HX COLONIC POLYPS  . SINUSITIS - ACUTE-NOS   Past Medical History  Diagnosis Date  . Adenomatous colon polyp 02/2008  . Esophagitis 02/13/01  . Gastritis 02/13/01  . Hiatal hernia   . GERD (gastroesophageal reflux disease)   . Hemorrhoids   . Reactive depression (situational)   . Basal cell carcinoma of face     Mohs procedure  . AK (actinic keratosis)   . Hemorrhoids    Past Surgical History  Procedure Date  . Mohs surgery   . Esophagogastroduodenoscopy 02/14/03    esophagitis, gastritis, duodenitis without hemorrhage  . Colonoscopy 7/8/.04    polyps, ext hemorrhoids  . Colonosocpy 2009    polyps, rec rpt 5 yrs   History    Substance Use Topics  . Smoking status: Current Everyday Smoker -- 1.0 packs/day    Types: Cigarettes  . Smokeless tobacco: Never Used  . Alcohol Use: No   Family History  Problem Relation Age of Onset  . Hypertension Father   . Gout Father   . GER disease Father   . Colon polyps Father   . Breast cancer Mother   . Colon polyps Brother   . Glaucoma Brother     early onset  . Heart attack Paternal Grandfather   . Transient ischemic attack Maternal Grandfather   . Diabetes Neg Hx    Allergies  Allergen Reactions  . Aspirin     REACTION: "did not work"  . Codeine Sulfate     REACTION: "did not work"     Review of Systems  Constitutional: Negative for fever, chills, activity change, appetite change, fatigue and unexpected weight change.  HENT: Negative for hearing loss and neck pain.   Eyes: Negative for visual disturbance.  Respiratory: Negative for cough, chest tightness, shortness of breath and wheezing.   Cardiovascular: Negative for chest pain, palpitations and leg swelling.  Gastrointestinal: Negative for nausea, vomiting, abdominal pain, diarrhea, constipation, blood in stool and abdominal distention.  Genitourinary: Negative for hematuria and difficulty urinating.  Musculoskeletal: Negative for myalgias and arthralgias.  Skin: Negative for rash.  Neurological: Negative for dizziness, seizures, syncope and headaches.  Hematological: Does not bruise/bleed  easily.  Psychiatric/Behavioral: Negative for dysphoric mood. The patient is not nervous/anxious.        Objective:   Physical Exam  Nursing note and vitals reviewed. Constitutional: He appears well-developed and well-nourished. No distress.  HENT:  Head: Normocephalic and atraumatic.  Mouth/Throat: Oropharynx is clear and moist. No oropharyngeal exudate.  Eyes: Conjunctivae and EOM are normal. Pupils are equal, round, and reactive to light. No scleral icterus.  Neck: Normal range of motion. Neck supple.        L trap tightness and tenderness to palpation  Cardiovascular: Normal rate, regular rhythm, normal heart sounds and intact distal pulses.   No murmur heard. Pulmonary/Chest: Effort normal and breath sounds normal. No respiratory distress. He has no wheezes. He has no rales.  Abdominal: Soft. Bowel sounds are normal. He exhibits no distension and no mass. There is no tenderness. There is no rebound and no guarding.  Genitourinary:       Deferred per pt preference  Musculoskeletal: He exhibits no edema.  Lymphadenopathy:    He has no cervical adenopathy.  Skin: Skin is warm and dry. No rash noted.  Psychiatric: He has a normal mood and affect.       Assessment & Plan:

## 2012-01-28 NOTE — Assessment & Plan Note (Signed)
Continue to encourage cessation. Contemplative. 

## 2012-01-28 NOTE — Assessment & Plan Note (Signed)
Slowly resolving on own. May try flexeril and discussed stretching exercises. Continue home regimen of heating pad and tylenol prn.

## 2012-01-28 NOTE — Patient Instructions (Signed)
Blood work today. Let us know if want help to quit smoking. good to see you today, call us with questions

## 2012-09-01 ENCOUNTER — Encounter: Payer: Self-pay | Admitting: Family Medicine

## 2012-09-01 ENCOUNTER — Ambulatory Visit (INDEPENDENT_AMBULATORY_CARE_PROVIDER_SITE_OTHER): Payer: BC Managed Care – PPO | Admitting: Family Medicine

## 2012-09-01 ENCOUNTER — Encounter: Payer: Self-pay | Admitting: *Deleted

## 2012-09-01 VITALS — BP 110/80 | HR 76 | Temp 98.2°F | Wt 152.5 lb

## 2012-09-01 DIAGNOSIS — J019 Acute sinusitis, unspecified: Secondary | ICD-10-CM | POA: Insufficient documentation

## 2012-09-01 MED ORDER — AMOXICILLIN-POT CLAVULANATE 875-125 MG PO TABS: 1.0000 | ORAL_TABLET | Freq: Two times a day (BID) | ORAL | Status: AC

## 2012-09-01 MED ORDER — HYDROCOD POLST-CHLORPHEN POLST 10-8 MG/5ML PO LQCR: 5.0000 mL | Freq: Every evening | ORAL | Status: DC | PRN

## 2012-09-01 NOTE — Progress Notes (Signed)
  Subjective:    Patient ID: Michael [name], male    DOB: [birth year], 52 y.o.   MRN: [id number]  HPI CC: cough,   Fighting cold for 1 week.  At night time worse congestion/cough.  + feeling hot.  Coughing keeping him up.  Productive cough of white phlegm.  + PNdrainage.  Rhinorrhea and cozyra.  Headache described as pressure pain.  + facial pain as well worse in am.  Taking alka seltzer cold.  No abd pain, nausea, ear or tooth pain.  Wife sick recently. Smoking 1 ppd - precontemplative. No h/o asthma/COPD.  Past Medical History  Diagnosis Date  . Adenomatous colon polyp 02/2008  . Esophagitis 02/13/01  . Gastritis 02/13/01  . Hiatal hernia   . GERD (gastroesophageal reflux disease)   . Hemorrhoids   . Reactive depression (situational)   . Basal cell carcinoma of face     Mohs procedure  . AK (actinic keratosis)   . Hemorrhoids   . Smoker      Review of Systems Per HPI    Objective:   Physical Exam  Nursing note and vitals reviewed. Constitutional: He appears well-developed and well-nourished. No distress.       Evidently congested. Tired, nontoxic  HENT:  Head: Normocephalic and atraumatic.  Right Ear: Hearing, tympanic membrane, external ear and ear canal normal.  Left Ear: Hearing, tympanic membrane, external ear and ear canal normal.  Nose: Mucosal edema present. No rhinorrhea. Right sinus exhibits no maxillary sinus tenderness and no frontal sinus tenderness. Left sinus exhibits no maxillary sinus tenderness and no frontal sinus tenderness.  Mouth/Throat: Uvula is midline and mucous membranes are normal. Posterior oropharyngeal erythema (mild) present. No oropharyngeal exudate, posterior oropharyngeal edema or tonsillar abscesses.  Eyes: Conjunctivae normal and EOM are normal. Pupils are equal, round, and reactive to light. No scleral icterus.  Neck: Normal range of motion. Neck supple.  Cardiovascular: Normal rate, regular rhythm, normal heart sounds and intact distal  pulses.   No murmur heard. Pulmonary/Chest: Effort normal and breath sounds normal. No respiratory distress. He has no wheezes. He has no rales.       Dry cough present  Lymphadenopathy:    He has no cervical adenopathy.  Skin: Skin is warm and dry. No rash noted.       Assessment & Plan:

## 2012-09-01 NOTE — Assessment & Plan Note (Signed)
Given duration of 7 days, anticipate viral sinusitis - discussed this. Supportive care as per instructions. If worsening or not improving as expected, fill augmentin. Pt agrees with plan. tussionex for cough at night.

## 2012-09-01 NOTE — Patient Instructions (Signed)
You have a sinus infection. Take medicine as prescribed: tussionex for cough at night. Hold onto augmentin script and fill if worsening cough, fever >101 or prolonged symptoms past 10 days. Push fluids and plenty of rest. Nasal saline irrigation or neti pot to help drain sinuses. May use simple mucinex with plenty of fluid to help mobilize mucous. Let us know if fever >101.5, trouble opening/closing mouth, difficulty swallowing, or worsening - you may need to be seen again.

## 2012-10-21 ENCOUNTER — Other Ambulatory Visit: Payer: Self-pay | Admitting: Gastroenterology

## 2012-10-23 ENCOUNTER — Telehealth: Payer: Self-pay | Admitting: Gastroenterology

## 2012-10-23 MED ORDER — RABEPRAZOLE SODIUM 20 MG PO TBEC: 20.0000 mg | DELAYED_RELEASE_TABLET | Freq: Every day | ORAL | Status: DC

## 2012-10-23 NOTE — Telephone Encounter (Signed)
Told patient we will send one refill to his pharmacy until his scheduled office visit.

## 2012-10-30 ENCOUNTER — Encounter: Payer: Self-pay | Admitting: Gastroenterology

## 2012-10-30 ENCOUNTER — Ambulatory Visit (INDEPENDENT_AMBULATORY_CARE_PROVIDER_SITE_OTHER): Payer: BC Managed Care – PPO | Admitting: Gastroenterology

## 2012-10-30 VITALS — BP 110/70 | HR 88 | Ht 67.75 in | Wt 157.4 lb

## 2012-10-30 DIAGNOSIS — Z860100 Personal history of colon polyps, unspecified: Secondary | ICD-10-CM

## 2012-10-30 DIAGNOSIS — K219 Gastro-esophageal reflux disease without esophagitis: Secondary | ICD-10-CM

## 2012-10-30 MED ORDER — OMEPRAZOLE 40 MG PO CPDR: 40.0000 mg | DELAYED_RELEASE_CAPSULE | Freq: Every day | ORAL | Status: DC

## 2012-10-30 NOTE — Patient Instructions (Addendum)
You have been given a separate informational sheet regarding your tobacco use, the importance of quitting and local resources to help you quit.  We have sent the following medications to your pharmacy for you to pick up at your convenience: Omeprazole 40 mg daily (take in place of Aciphex).  You will be due for a recall colonoscopy in 02/2013. We will send you a reminder in the mail when it gets closer to that time.  Thank you for choosing Dr Russella Dar and [facility]!!!

## 2012-10-30 NOTE — Progress Notes (Signed)
History of Present Illness: This is a 52 year old male chronic GERD. Symptoms are well controlled on rabeprazole. Denies weight loss, abdominal pain, constipation, diarrhea, change in stool caliber, melena, hematochezia, nausea, vomiting, dysphagia, reflux symptoms, chest pain.  Current Medications, Allergies, Past Medical History, Past Surgical History, Family History and Social History were reviewed in [employer] record.  Physical Exam: General: Well developed , well nourished, no acute distress Head: Normocephalic and atraumatic Eyes:  sclerae anicteric, EOMI Ears: Normal auditory acuity Mouth: No deformity or lesions Lungs: Clear throughout to auscultation Heart: Regular rate and rhythm; no murmurs, rubs or bruits Abdomen: Soft, non tender and non distended. No masses, hepatosplenomegaly or hernias noted. Normal Bowel sounds Rectal: not done  Musculoskeletal: Symmetrical with no gross deformities  Pulses:  Normal pulses noted Extremities: No clubbing, cyanosis, edema or deformities noted Neurological: Alert oriented x 4, grossly nonfocal Psychological:  Alert and cooperative. Normal mood and affect  Assessment and Recommendations:  1. GERD. Change to omeprazole 40 mg daily if this is less expensive than rabeprazole 20 mg daily. Continue standard antireflux measures.  2. Personal history of adenomatous colon polyps. Surveillance colonoscopy in 02/2013.

## 2013-01-04 ENCOUNTER — Ambulatory Visit (INDEPENDENT_AMBULATORY_CARE_PROVIDER_SITE_OTHER): Payer: BC Managed Care – PPO | Admitting: Family Medicine

## 2013-01-04 ENCOUNTER — Encounter: Payer: Self-pay | Admitting: Family Medicine

## 2013-01-04 ENCOUNTER — Encounter: Payer: Self-pay | Admitting: *Deleted

## 2013-01-04 VITALS — BP 110/70 | HR 72 | Temp 97.7°F | Wt 153.5 lb

## 2013-01-04 DIAGNOSIS — F4321 Adjustment disorder with depressed mood: Secondary | ICD-10-CM

## 2013-01-04 MED ORDER — FLUOXETINE HCL 40 MG PO CAPS: 40.0000 mg | ORAL_CAPSULE | Freq: Every day | ORAL | Status: DC

## 2013-01-04 NOTE — Assessment & Plan Note (Signed)
Deteriorated mainly depressed mood, concentration and energy level. Possibly attributable to depression - increase prozac to 40mg  daily for next month and then reassess. Discussed importance of rest and personal time to relieve stress.

## 2013-01-04 NOTE — Patient Instructions (Signed)
Let's increase prozac to 40mg  daily - you may take 2 pills daily until you run out.Peri Jefferson to see you today, call us with questions. Return to see me in 1 month if no noted improvement.

## 2013-01-04 NOTE — Progress Notes (Signed)
  Subjective:    Patient ID: Michael [name], male    DOB: 1961-07-11, 52 y.o.   MRN: [id number]  HPI CC: mood issues  H/o situational depression - started on prozac after mother's death several years back.  Takes prozac 20mg  QAM.  For last 3 months, noticing decreased energy level especially in afternoons, feeling more depressed, concentration decreased and difficulty with memory.  Sleep unchanged. Good appetite. No anhedonia - enjoys hunting and fishing.   No SI/HI. Denies excessive anxiety. Libido ok.  40hr work week, as well as side jobs on weekends.  Feels like needs a break.  Past Medical History  Diagnosis Date  . Adenomatous colon polyp 02/2008  . Esophagitis 02/13/01  . Gastritis 02/13/01  . Hiatal hernia   . GERD (gastroesophageal reflux disease)   . Hemorrhoids   . Reactive depression (situational)   . Basal cell carcinoma of face     Mohs procedure  . AK (actinic keratosis)   . Hemorrhoids   . Smoker      Review of Systems Per HPI    Objective:   Physical Exam  Nursing note and vitals reviewed. Constitutional: He appears well-developed and well-nourished. No distress.  Psychiatric: He has a normal mood and affect.       Assessment & Plan:

## 2013-01-15 ENCOUNTER — Telehealth: Payer: Self-pay

## 2013-01-15 DIAGNOSIS — R5383 Other fatigue: Secondary | ICD-10-CM

## 2013-01-15 NOTE — Telephone Encounter (Signed)
pts wife said pt seen 01/04/13 and prozac was increased to 40 mg; Pt took for approx 1 week and then read side effects of Prozac and began taking Prozac 20 mg today. Pt read prozac could cause pt to be tired. Pt said he is not depressed he is tired.Also Aciphex can lower B 12 and that can make pt tired. Pt wants to have lab test for being tired.Please advise.

## 2013-01-16 ENCOUNTER — Telehealth: Payer: Self-pay | Admitting: *Deleted

## 2013-01-16 NOTE — Telephone Encounter (Addendum)
We discussed this at his office visit - along with fatigue he endorsed depressed mood and decreased ability to concentrate.  All these could come from depression so that's why I wanted to increase prozac. If desired, he may come in for blood work to check on reversible causes of fatigue - which would have been next step if no noted improvement on prozac 40mg  (I would like him to take 40mg  for 1 mo to see if any improvement noted)

## 2013-01-16 NOTE — Telephone Encounter (Signed)
Spoke with patient's wife. Scheduled lab appt. Advised that at previous appt pt endorsed s/s of depression which is why Dr. Reece Agar increased prozac and wanted him to try the increase x 1 month. She advised that she understood, but patient refused to increase meds because he didn't "feel" depressed. Advised that fatigue and concentration issues can be signs of depression without feeling sad. Advised that if labs came back normal that Dr. Reece Agar would still want patient to try the increase of meds. She verbalized understanding and would pass message to patient.

## 2013-01-20 ENCOUNTER — Other Ambulatory Visit: Payer: Self-pay | Admitting: Family Medicine

## 2013-01-26 ENCOUNTER — Other Ambulatory Visit: Payer: BC Managed Care – PPO

## 2013-02-01 NOTE — Telephone Encounter (Signed)
Late entry- (Phone note disappeared from que and I did not document conversation) Patient's wife was notified and said she would notify patient.

## 2013-02-02 ENCOUNTER — Other Ambulatory Visit (INDEPENDENT_AMBULATORY_CARE_PROVIDER_SITE_OTHER): Payer: BC Managed Care – PPO

## 2013-02-02 DIAGNOSIS — R5383 Other fatigue: Secondary | ICD-10-CM

## 2013-02-02 LAB — CBC WITH DIFFERENTIAL/PLATELET
Basophils Relative: 0.4 % (ref 0.0–3.0)
Eosinophils Absolute: 0.2 10*3/uL (ref 0.0–0.7)
Eosinophils Relative: 2.2 % (ref 0.0–5.0)
HCT: 44 % (ref 39.0–52.0)
Lymphs Abs: 2.3 10*3/uL (ref 0.7–4.0)
MCHC: 34.1 g/dL (ref 30.0–36.0)
MCV: 94.6 fl (ref 78.0–100.0)
Monocytes Absolute: 0.5 10*3/uL (ref 0.1–1.0)
Neutrophils Relative %: 72 % (ref 43.0–77.0)
Platelets: 265 10*3/uL (ref 150.0–400.0)
RBC: 4.65 Mil/uL (ref 4.22–5.81)
WBC: 11.2 10*3/uL — ABNORMAL HIGH (ref 4.5–10.5)

## 2013-02-02 LAB — BASIC METABOLIC PANEL
BUN: 10 mg/dL (ref 6–23)
CO2: 25 mEq/L (ref 19–32)
Chloride: 105 mEq/L (ref 96–112)
Creatinine, Ser: 0.8 mg/dL (ref 0.4–1.5)
Potassium: 3.8 mEq/L (ref 3.5–5.1)

## 2013-02-28 ENCOUNTER — Encounter: Payer: Self-pay | Admitting: Gastroenterology

## 2013-03-10 ENCOUNTER — Other Ambulatory Visit: Payer: Self-pay | Admitting: Gastroenterology

## 2013-03-23 ENCOUNTER — Telehealth: Payer: Self-pay | Admitting: Gastroenterology

## 2013-03-23 MED ORDER — RABEPRAZOLE SODIUM 20 MG PO TBEC: 20.0000 mg | DELAYED_RELEASE_TABLET | Freq: Every day | ORAL | Status: DC

## 2013-03-23 NOTE — Telephone Encounter (Signed)
Patient's wife reports that Aciphex worked better and omeprazole does not at all.  i have sent rx for aciphex based on last office note 04/2013

## 2013-04-27 ENCOUNTER — Encounter: Payer: Self-pay | Admitting: Gastroenterology

## 2013-07-16 ENCOUNTER — Encounter: Payer: BC Managed Care – PPO | Admitting: Gastroenterology

## 2013-08-09 HISTORY — PX: COLONOSCOPY: SHX174

## 2013-08-14 ENCOUNTER — Ambulatory Visit (AMBULATORY_SURGERY_CENTER): Payer: Self-pay

## 2013-08-14 VITALS — Ht 70.0 in | Wt 160.8 lb

## 2013-08-14 DIAGNOSIS — Z86010 Personal history of colonic polyps: Secondary | ICD-10-CM

## 2013-08-14 DIAGNOSIS — Z8371 Family history of colonic polyps: Secondary | ICD-10-CM

## 2013-08-14 MED ORDER — MOVIPREP 100 G PO SOLR: ORAL | Status: DC

## 2013-08-16 ENCOUNTER — Encounter: Payer: Self-pay | Admitting: Gastroenterology

## 2013-08-20 ENCOUNTER — Encounter: Payer: Self-pay | Admitting: Internal Medicine

## 2013-08-20 ENCOUNTER — Encounter: Payer: Self-pay | Admitting: *Deleted

## 2013-08-20 ENCOUNTER — Ambulatory Visit (INDEPENDENT_AMBULATORY_CARE_PROVIDER_SITE_OTHER): Payer: BC Managed Care – PPO | Admitting: Internal Medicine

## 2013-08-20 VITALS — BP 120/70 | HR 82 | Temp 98.0°F | Wt 155.0 lb

## 2013-08-20 DIAGNOSIS — J111 Influenza due to unidentified influenza virus with other respiratory manifestations: Secondary | ICD-10-CM

## 2013-08-20 MED ORDER — AMOXICILLIN 500 MG PO TABS: 1000.0000 mg | ORAL_TABLET | Freq: Two times a day (BID) | ORAL | Status: DC

## 2013-08-20 MED ORDER — HYDROCODONE-HOMATROPINE 5-1.5 MG/5ML PO SYRP: 5.0000 mL | ORAL_SOLUTION | Freq: Every evening | ORAL | Status: DC | PRN

## 2013-08-20 NOTE — Progress Notes (Signed)
Subjective:    Patient ID: Michael [name], male    DOB: 10/25/1960, 53 y.o.   MRN: [id number]  HPI Has been sick for 2 days Lots of head congestion and drainage Cough Feels hot and cold  Intermittent fever Some sweats No SOB Having aches and soreness  Clear nasal drainage Some post nasal drip Some sore throat Right ear pain for a while--better now  Had left over hycodan syrup --but out of date Used tylenol--helped only a little  Current Outpatient Prescriptions on File Prior to Visit  Medication Sig Dispense Refill  . Bioflavonoid Products (ESTER C PO) Take by mouth daily.       . cyanocobalamin 100 MCG tablet Take 100 mcg by mouth every other day.      . fish oil-omega-3 fatty acids 1000 MG capsule Take 2 g by mouth daily.        Marland Kitchen FLUoxetine (PROZAC) 20 MG capsule Take 2 capsules (40 mg total) by mouth daily.  90 capsule  3  . loratadine (CLARITIN) 10 MG tablet Take 10 mg by mouth daily as needed.        . Multiple Vitamin (MULTIVITAMIN) tablet Take 1 tablet by mouth daily.        . RABEprazole (ACIPHEX) 20 MG tablet Take 1 tablet (20 mg total) by mouth daily.  30 tablet  11   No current facility-administered medications on file prior to visit.    Allergies  Allergen Reactions  . Aspirin     REACTION: "did not work"  . Codeine Sulfate     REACTION: "did not work"    Past Medical History  Diagnosis Date  . Adenomatous colon polyp 02/2008  . Esophagitis 02/13/01  . Gastritis 02/13/01  . Hiatal hernia   . GERD (gastroesophageal reflux disease)   . Hemorrhoids   . Reactive depression (situational)   . Basal cell carcinoma of face     Mohs procedure  . AK (actinic keratosis)   . Hemorrhoids   . Smoker     Past Surgical History  Procedure Laterality Date  . Mohs surgery    . Esophagogastroduodenoscopy  02/14/03    esophagitis, gastritis, duodenitis without hemorrhage  . Colonoscopy  7/8/.04    polyps, ext hemorrhoids  . Colonosocpy  2009    polyps, rec rpt  5 yrs  . Tonsillectomy      Family History  Problem Relation Age of Onset  . Hypertension Father   . Gout Father   . GER disease Father   . Colon polyps Father   . Breast cancer Mother   . Colon polyps Brother   . Glaucoma Brother     early onset  . Heart attack Paternal Grandfather   . Transient ischemic attack Maternal Grandfather   . Diabetes Neg Hx     History   Social History  . Marital Status: Married    Spouse Name: N/A    Number of Children: 0  . Years of Education: N/A   Occupational History  . 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 Smoking status: Current Every Day Smoker -- 1.00 packs/day    Types: Cigarettes  . Smokeless tobacco: Never Used  . Alcohol Use: No  . Drug Use: No  . Sexual Activity: Not on file   Other Topics Concern  . Not on file   Social History Narrative   Caffeine: 1 cup coffee/yda, Dr Malachi Bonds throughout the day   Lives with wife  Occupation: HVAC   Activity: work, Retail buyer, mows yard   Diet: good water, fruits/vegetables daily   Review of Systems No rash No vomiting Slight loose stools yesterday--better today Appetite is off Did take flu shot     Objective:   Physical Exam  Constitutional:  Mildly ill appearing  HENT:  Mild frontal>maxillary tenderness TMs normal Mild nasal inflammation Mild pharyngeal injection  Neck: Normal range of motion. Neck supple. No thyromegaly present.  Pulmonary/Chest: Effort normal and breath sounds normal. No respiratory distress. He has no wheezes. He has no rales.  Abdominal: Soft. There is no tenderness.  Musculoskeletal: He exhibits no edema and no tenderness.  Lymphadenopathy:    He has no cervical adenopathy.          Assessment & Plan:

## 2013-08-20 NOTE — Progress Notes (Signed)
Pre-visit discussion using our clinic review tool. No additional management support is needed unless otherwise documented below in the visit note.  

## 2013-08-20 NOTE — Patient Instructions (Signed)

## 2013-08-20 NOTE — Assessment & Plan Note (Signed)
Fairly typical course Discussed viral etiology Has some sinus symptoms  Discussed supportive care Rx for amoxil if he worsens Hycodan syrup

## 2013-08-28 ENCOUNTER — Encounter: Payer: BC Managed Care – PPO | Admitting: Gastroenterology

## 2013-09-08 ENCOUNTER — Telehealth: Payer: Self-pay | Admitting: Family Medicine

## 2013-09-08 NOTE — Telephone Encounter (Signed)
Relevant patient education assigned to patient using Emmi. ° °

## 2013-09-21 ENCOUNTER — Encounter: Payer: BC Managed Care – PPO | Admitting: Gastroenterology

## 2013-09-29 ENCOUNTER — Other Ambulatory Visit: Payer: Self-pay | Admitting: Family Medicine

## 2013-11-16 ENCOUNTER — Encounter: Payer: Self-pay | Admitting: Gastroenterology

## 2013-11-16 ENCOUNTER — Ambulatory Visit (AMBULATORY_SURGERY_CENTER): Payer: BC Managed Care – PPO | Admitting: Gastroenterology

## 2013-11-16 VITALS — BP 114/69 | HR 64 | Temp 96.3°F | Resp 25 | Ht 70.0 in | Wt 160.0 lb

## 2013-11-16 DIAGNOSIS — D126 Benign neoplasm of colon, unspecified: Secondary | ICD-10-CM

## 2013-11-16 DIAGNOSIS — Z860100 Personal history of colon polyps, unspecified: Secondary | ICD-10-CM

## 2013-11-16 MED ORDER — SODIUM CHLORIDE 0.9 % IV SOLN: 500.0000 mL | INTRAVENOUS | Status: DC

## 2013-11-16 NOTE — Progress Notes (Signed)
Procedure ends, to recovery, report given and VSS. 

## 2013-11-16 NOTE — Op Note (Signed)
[facility]  [address]. [place], [id number]   COLONOSCOPY PROCEDURE REPORT PATIENT: Michael [name], Michael [name]  MR#: [id number] BIRTHDATE: Jun 19, 1961 , 84  yrs. old GENDER: Male ENDOSCOPIST: Ladene Artist, MD, [facility] PROCEDURE DATE:  11/16/2013 PROCEDURE:   Colonoscopy with biopsy and snare polypectomy First Screening Colonoscopy - Avg.  risk and is 50 yrs.  old or older - No.  Prior Negative Screening - Now for repeat screening. N/A  History of Adenoma - Now for follow-up colonoscopy & has been > or = to 3 yrs.  Yes hx of adenoma.  Has been 3 or more years since last colonoscopy.  Polyps Removed Today? Yes. ASA CLASS:   Class II INDICATIONS:Patient's personal history of adenomatous colon polyps.  MEDICATIONS: MAC sedation, administered by CRNA and propofol (Diprivan) 200mg  IV DESCRIPTION OF PROCEDURE:   After the risks benefits and alternatives of the procedure were thoroughly explained, informed consent was obtained.  A digital rectal exam revealed no abnormalities of the rectum.   The LB [id number]  endoscope was introduced through the anus and advanced to the cecum, which was identified by both the appendix and ileocecal valve. No adverse events experienced.   The quality of the prep was excellent, using MoviPrep  The instrument was then slowly withdrawn as the colon was fully examined.  COLON FINDINGS: Three sessile polyps measuring 5-6 mm in size were found in the ascending colon, transverse colon, and descending colon.  A polypectomy was performed with a cold snare.  The resection was complete and the polyp tissue was completely retrieved except for the ascending colon polyp.   Two sessile polyps measuring 4 mm in size were found in the descending colon and sigmoid colon.  A polypectomy was performed with cold forceps. The resection was complete and the polyp tissue was completely retrieved.   The colon was otherwise normal.  There was no diverticulosis,  inflammation, polyps or cancers unless previously stated.  Retroflexed views revealed small internal hemorrhoids. The time to cecum=1 minutes 36 seconds.  Withdrawal time=13 minutes 17 seconds.  The scope was withdrawn and the procedure completed. COMPLICATIONS: There were no complications.  ENDOSCOPIC IMPRESSION: 1.   Three sessile polyps measuring 5-6 mm in the ascending, transverse, and descending colon; polypectomy performed with a cold snare 2.   Two sessile polyps measuring 4 mm in the descending and sigmoid colon; polypectomy performed with cold forceps 3.   Small internal hemorrhoids  RECOMMENDATIONS: 1.  Await pathology results 2.  Repeat Colonoscopy in 5 years  eSigned:  Ladene Artist, MD, [facility] 11/16/2013 2:34 PM

## 2013-11-16 NOTE — Patient Instructions (Signed)
Discharge instructions given with verbal understanding. Handouts on polyps and hemorrhoids. Resume previous medications. YOU HAD AN ENDOSCOPIC PROCEDURE TODAY AT THE [facility]: Refer to the procedure report that was given to you for any specific questions about what was found during the examination.  If the procedure report does not answer your questions, please call your gastroenterologist to clarify.  If you requested that your care partner not be given the details of your procedure findings, then the procedure report has been included in a sealed envelope for you to review at your convenience later.  YOU SHOULD EXPECT: Some feelings of bloating in the abdomen. Passage of more gas than usual.  Walking can help get rid of the air that was put into your GI tract during the procedure and reduce the bloating. If you had a lower endoscopy (such as a colonoscopy or flexible sigmoidoscopy) you may notice spotting of blood in your stool or on the toilet paper. If you underwent a bowel prep for your procedure, then you may not have a normal bowel movement for a few days.  DIET: Your first meal following the procedure should be a light meal and then it is ok to progress to your normal diet.  A half-sandwich or bowl of soup is an example of a good first meal.  Heavy or fried foods are harder to digest and may make you feel nauseous or bloated.  Likewise meals heavy in dairy and vegetables can cause extra gas to form and this can also increase the bloating.  Drink plenty of fluids but you should avoid alcoholic beverages for 24 hours.  ACTIVITY: Your care partner should take you home directly after the procedure.  You should plan to take it easy, moving slowly for the rest of the day.  You can resume normal activity the day after the procedure however you should NOT DRIVE or use heavy machinery for 24 hours (because of the sedation medicines used during the test).    SYMPTOMS TO REPORT  IMMEDIATELY: A gastroenterologist can be reached at any hour.  During normal business hours, 8:30 AM to 5:00 PM Monday through Friday, call [phone].  After hours and on weekends, please call the GI answering service at [phone] who will take a message and have the physician on call contact you.   Following lower endoscopy (colonoscopy or flexible sigmoidoscopy):  Excessive amounts of blood in the stool  Significant tenderness or worsening of abdominal pains  Swelling of the abdomen that is new, acute  Fever of 100F or higher  FOLLOW UP: If any biopsies were taken you will be contacted by phone or by letter within the next 1-3 weeks.  Call your gastroenterologist if you have not heard about the biopsies in 3 weeks.  Our staff will call the home number listed on your records the next business day following your procedure to check on you and address any questions or concerns that you may have at that time regarding the information given to you following your procedure. This is a courtesy call and so if there is no answer at the home number and we have not heard from you through the emergency physician on call, we will assume that you have returned to your regular daily activities without incident.  SIGNATURES/CONFIDENTIALITY: You and/or your care partner have signed paperwork which will be entered into your electronic medical record.  These signatures attest to the fact that that the information above on your After Visit Summary   has been reviewed and is understood.  Full responsibility of the confidentiality of this discharge information lies with you and/or your care-partner. 

## 2013-11-16 NOTE — Progress Notes (Signed)
Called to room to assist during endoscopic procedure.  Patient ID and intended procedure confirmed with present staff. Received instructions for my participation in the procedure from the performing physician.  

## 2013-11-19 ENCOUNTER — Telehealth: Payer: Self-pay | Admitting: *Deleted

## 2013-11-19 NOTE — Telephone Encounter (Signed)
  Follow up Call-  Call back number 11/16/2013  Post procedure Call Back phone  # [phone]  Permission to leave phone message Yes     Patient questions:  Do you have a fever, pain , or abdominal swelling? no Pain Score  0 *  Have you tolerated food without any problems? yes  Have you been able to return to your normal activities? yes  Do you have any questions about your discharge instructions: Diet   no Medications  no Follow up visit  no  Do you have questions or concerns about your Care? no  Actions: * If pain score is 4 or above: No action needed, pain <4.

## 2013-11-22 ENCOUNTER — Encounter: Payer: Self-pay | Admitting: Gastroenterology

## 2013-11-29 ENCOUNTER — Encounter: Payer: Self-pay | Admitting: Family Medicine

## 2014-03-06 ENCOUNTER — Ambulatory Visit (INDEPENDENT_AMBULATORY_CARE_PROVIDER_SITE_OTHER)
Admission: RE | Admit: 2014-03-06 | Discharge: 2014-03-06 | Disposition: A | Discharge status: Home / Self Care | Payer: BC Managed Care – PPO | Source: Ambulatory Visit | Attending: Family Medicine | Admitting: Family Medicine

## 2014-03-06 ENCOUNTER — Encounter: Payer: Self-pay | Admitting: Family Medicine

## 2014-03-06 ENCOUNTER — Ambulatory Visit (INDEPENDENT_AMBULATORY_CARE_PROVIDER_SITE_OTHER): Payer: BC Managed Care – PPO | Admitting: Family Medicine

## 2014-03-06 VITALS — BP 110/68 | HR 66 | Temp 98.5°F | Wt 153.8 lb

## 2014-03-06 DIAGNOSIS — R300 Dysuria: Secondary | ICD-10-CM

## 2014-03-06 DIAGNOSIS — R109 Unspecified abdominal pain: Secondary | ICD-10-CM

## 2014-03-06 DIAGNOSIS — Z87442 Personal history of urinary calculi: Secondary | ICD-10-CM | POA: Insufficient documentation

## 2014-03-06 DIAGNOSIS — N200 Calculus of kidney: Secondary | ICD-10-CM | POA: Insufficient documentation

## 2014-03-06 LAB — POCT URINALYSIS DIPSTICK
Bilirubin, UA: NEGATIVE
GLUCOSE UA: NEGATIVE
Ketones, UA: NEGATIVE
Nitrite, UA: NEGATIVE
SPEC GRAV UA: 1.015
Urobilinogen, UA: 0.2
pH, UA: 6.5

## 2014-03-06 MED ORDER — OXYCODONE HCL 5 MG PO TABS: 5.0000 mg | ORAL_TABLET | Freq: Four times a day (QID) | ORAL | Status: DC | PRN

## 2014-03-06 MED ORDER — TAMSULOSIN HCL 0.4 MG PO CAPS: 0.4000 mg | ORAL_CAPSULE | Freq: Every day | ORAL | Status: DC

## 2014-03-06 NOTE — Patient Instructions (Signed)
Drink plenty of water, cut back on the Dr. Malachi Bonds, take the oxycodone and flomax.   Out of work in the meantime.  If you haven't passed it by next week, then notify me. Take care.

## 2014-03-06 NOTE — Assessment & Plan Note (Signed)
Likely 89mm stone.  KUB reviewed with patient. Should be able to pass stone.  Cut back on soda, inc water, oxycodone and flomax in the meantime. If not passed in a few days he'll notify me.  Nontoxic.  No apparent need for abx at this point.  Call back prn. Routine cautions given on meds.

## 2014-03-06 NOTE — Progress Notes (Signed)
Pre visit review using our clinic review tool, if applicable. No additional management support is needed unless otherwise documented below in the visit note.  Last 3-4 days.  Burning with urination.  More pain today, some back pain.  R sided back pain.  No L sided sx.  Pain is variable, coming and going.  The pain hasn't moved much, still located in his back.  No fevers. No vomiting. No gross blood in urine.  No trauma to explain the back pain. He drinks a lot of soda.    No h/o renal stones before today.    PMH and SH reviewed  ROS: See HPI, otherwise noncontributory.  Meds, vitals, and allergies reviewed.   nad ncat Neck supple, no LA rrr ctab abd soft, not ttp R flank ttp, no cva pain, back w/o midline pain. No L sided back pain Ext w/o edema

## 2014-03-27 ENCOUNTER — Other Ambulatory Visit: Payer: Self-pay | Admitting: Gastroenterology

## 2014-04-03 ENCOUNTER — Telehealth: Payer: Self-pay | Admitting: Gastroenterology

## 2014-04-03 NOTE — Telephone Encounter (Signed)
Received confirmation from Abigail Butts from [employer] that insurance has improved rabeprazole sodium and we can call the pharmacy and the patient to notify them. Confirmed with pharmacy that prescription went through and call patient notified him he can pick it up at pharmacy now.

## 2014-04-03 NOTE — Telephone Encounter (Signed)
Faxed PA form on 03/27/14 and will call to get status update.

## 2014-04-03 NOTE — Telephone Encounter (Signed)
Called and [facility] state they never received the PA form on 03/27/14. Did PA over the phone and it was approved. Case ID # [id number] and [id number]. Insurance company cannot get the prescription to run through the pharmacy as a test run. They state they need a supervisor to work on the prescription and they will call me back in a few minutes. Notified patient's wife of status and that I will call her as soon as I get an answer.

## 2014-06-26 ENCOUNTER — Other Ambulatory Visit: Payer: Self-pay | Admitting: Gastroenterology

## 2014-07-02 ENCOUNTER — Telehealth: Payer: Self-pay | Admitting: Gastroenterology

## 2014-07-02 MED ORDER — RABEPRAZOLE SODIUM 20 MG PO TBEC: DELAYED_RELEASE_TABLET | ORAL | Status: DC

## 2014-07-02 NOTE — Telephone Encounter (Signed)
Rx sent. Patient should keep 08/05/14 appt for further refills.

## 2014-07-25 ENCOUNTER — Ambulatory Visit (INDEPENDENT_AMBULATORY_CARE_PROVIDER_SITE_OTHER): Payer: BC Managed Care – PPO | Admitting: Family Medicine

## 2014-07-25 ENCOUNTER — Encounter: Payer: Self-pay | Admitting: Family Medicine

## 2014-07-25 VITALS — BP 118/72 | HR 72 | Temp 98.0°F | Wt 156.5 lb

## 2014-07-25 DIAGNOSIS — K2100 Gastro-esophageal reflux disease with esophagitis, without bleeding: Secondary | ICD-10-CM

## 2014-07-25 DIAGNOSIS — Z720 Tobacco use: Secondary | ICD-10-CM

## 2014-07-25 DIAGNOSIS — F17200 Nicotine dependence, unspecified, uncomplicated: Secondary | ICD-10-CM

## 2014-07-25 DIAGNOSIS — Z23 Encounter for immunization: Secondary | ICD-10-CM

## 2014-07-25 DIAGNOSIS — F4321 Adjustment disorder with depressed mood: Secondary | ICD-10-CM

## 2014-07-25 MED ORDER — FLUOXETINE HCL 20 MG PO CAPS: 40.0000 mg | ORAL_CAPSULE | Freq: Every day | ORAL | Status: DC

## 2014-07-25 MED ORDER — RABEPRAZOLE SODIUM 20 MG PO TBEC: DELAYED_RELEASE_TABLET | ORAL | Status: DC

## 2014-07-25 NOTE — Progress Notes (Signed)
Pre visit review using our clinic review tool, if applicable. No additional management support is needed unless otherwise documented below in the visit note. 

## 2014-07-25 NOTE — Assessment & Plan Note (Signed)
Pt desires to continue prozac at 40mg  daily.

## 2014-07-25 NOTE — Addendum Note (Signed)
Addended by: Josetta Huddle on: 07/25/2014 05:54 PM   Modules accepted: Orders

## 2014-07-25 NOTE — Assessment & Plan Note (Signed)
Refilled aciphex. Encouraged continued use of medication

## 2014-07-25 NOTE — Progress Notes (Signed)
   BP 118/72 mmHg  Pulse 72  Temp(Src) 98 F (36.7 C) (Oral)  Wt 156 lb 8 oz (70.988 kg)   CC: med refill visit  Subjective:    Patient ID: Michael [name], male    DOB: 12-19-1960, 53 y.o.   MRN: [id number]  HPI: TRONG GOSLING is a 53 y.o. male presenting on 07/25/2014 for Medication Refill   GERD - requests refill of aciphex.  Works well for him. H/o gastritis, esophagitis, duodenitis, and HH. Denies dysphagia, early satiety, nausea/vomiting.   Depression - long term on prozac 40mg  daily. Feels stable on current regimen, declines taper down. Denies depression, sadness, anhedonia. Enjoys deer hunting.   Smoking - still 1 ppd. precontemplative.  Relevant past medical, surgical, family and social history reviewed and updated as indicated. Interim medical history since our last visit reviewed. Allergies and medications reviewed and updated. Current Outpatient Prescriptions on File Prior to Visit  Medication Sig  . Bioflavonoid Products (ESTER C PO) Take by mouth daily.   . cyanocobalamin 100 MCG tablet Take 100 mcg by mouth every other day.  . fish oil-omega-3 fatty acids 1000 MG capsule Take 2 g by mouth daily.    Marland Kitchen loratadine (CLARITIN) 10 MG tablet Take 10 mg by mouth daily as needed.    . Multiple Vitamin (MULTIVITAMIN) tablet Take 1 tablet by mouth daily.     No current facility-administered medications on file prior to visit.    Review of Systems Per HPI unless specifically indicated above     Objective:    BP 118/72 mmHg  Pulse 72  Temp(Src) 98 F (36.7 C) (Oral)  Wt 156 lb 8 oz (70.988 kg)  Wt Readings from Last 3 Encounters:  07/25/14 156 lb 8 oz (70.988 kg)  03/06/14 153 lb 12 oz (69.741 kg)  11/16/13 160 lb (72.576 kg)    Physical Exam  Constitutional: He appears well-developed and well-nourished. No distress.  HENT:  Mouth/Throat: Oropharynx is clear and moist. No oropharyngeal exudate.  Eyes: Conjunctivae and EOM are normal. Pupils are equal, round,  and reactive to light.  Neck: Normal range of motion. Neck supple. No thyromegaly present.  Cardiovascular: Normal rate, regular rhythm, normal heart sounds and intact distal pulses.   No murmur heard. Pulmonary/Chest: Effort normal and breath sounds normal. No respiratory distress. He has no wheezes. He has no rales.  Musculoskeletal: He exhibits no edema.  Lymphadenopathy:    He has no cervical adenopathy.  Skin: Skin is warm and dry. No rash noted.  Psychiatric: He has a normal mood and affect.  Nursing note and vitals reviewed.      Assessment & Plan:   Problem List Items Addressed This Visit    TOBACCO ABUSE    Continue to strongly encourage cessation    GERD    Refilled aciphex. Encouraged continued use of medication    Relevant Medications      RABEprazole (ACIPHEX) 20 MG tablet   DEPRESSION, SITUATIONAL - Primary    Pt desires to continue prozac at 40mg  daily.        Follow up plan: Return in about 1 year (around 07/26/2015), or as needed, for annual exam, prior fasting for blood work.

## 2014-07-25 NOTE — Patient Instructions (Addendum)
Tdap today. meds refilled today. Keep thinking about quitting smoking - best thing you can do for your health! Return as needed or in 1 year for physical

## 2014-07-25 NOTE — Assessment & Plan Note (Addendum)
Continue to strongly encourage cessation

## 2014-08-05 ENCOUNTER — Ambulatory Visit: Payer: BC Managed Care – PPO | Admitting: Gastroenterology

## 2014-08-14 ENCOUNTER — Telehealth: Payer: Self-pay | Admitting: Family Medicine

## 2014-08-14 NOTE — Telephone Encounter (Signed)
Noted  

## 2014-08-14 NOTE — Telephone Encounter (Signed)
same note sent previously on 08/14/14.

## 2014-08-14 NOTE — Telephone Encounter (Signed)
Previous note sent earlier see 08/14/2014 phone note.

## 2014-08-14 NOTE — Telephone Encounter (Signed)
Patient Name: Michael [name]  DOB: 11-06-1960    Nurse Assessment  Nurse: Julien Girt, RN, Almyra Free Date/Time Eilene Ghazi Time): 08/14/2014 9:54:13 AM  Confirm and document reason for call. If symptomatic, describe symptoms. ---caller states her husband may have the flu or a cold - what can she give him OTC until his appt tomorrow. He has a runny nose, is sneezing and has sinus pressure. No fever, but he is aching. She has given advil. She has given him claritin, has mucinex  Has the patient traveled out of the country within the last 30 days? ---Not Applicable  Does the patient require triage? ---Yes  Related visit to physician within the last 2 weeks? ---No  Does the PT have any chronic conditions? (i.e. diabetes, asthma, etc.) ---Yes  List chronic conditions. ---probiotics, paxil( depression) aciflex (gerd)     Guidelines    Guideline Title Affirmed Question Affirmed Notes  Sinus Pain or Congestion [1] Sinus congestion as part of a cold AND [2] present < 10 days (all triage questions negative)    Final Disposition User   [place], RN, Almyra Free

## 2014-08-14 NOTE — Telephone Encounter (Signed)
Patient Name: [name]  DOB: 01/01/1961    Nurse Assessment  Nurse: Julien Girt, RN, Almyra Free Date/Time Eilene Ghazi Time): 08/14/2014 9:54:13 AM  Confirm and document reason for call. If symptomatic, describe symptoms. ---caller states her husband may have the flu or a cold - what can she give him OTC until his appt tomorrow. He has a runny nose, is sneezing and has sinus pressure. No fever, but he is aching. She has given advil. She has given him claritin, has mucinex  Has the patient traveled out of the country within the last 30 days? ---Not Applicable  Does the patient require triage? ---Yes  Related visit to physician within the last 2 weeks? ---No  Does the PT have any chronic conditions? (i.e. diabetes, asthma, etc.) ---Yes  List chronic conditions. ---probiotics, paxil( depression) aciflex (gerd)     Guidelines    Guideline Title Affirmed Question Affirmed Notes  Sinus Pain or Congestion [1] Sinus congestion as part of a cold AND [2] present < 10 days (all triage questions negative)    Final Disposition User   [place], RN, Almyra Free

## 2014-08-14 NOTE — Telephone Encounter (Signed)
[facility] Call         Call Documentation      Penni Homans at 08/14/2014 10:44 AM     Status: Signed       Expand All Collapse All   Patient Name: Michael [name]  DOB: [birth year]    Nurse Assessment  Nurse: Julien Girt, RN, Almyra Free Date/Time (Eastern Time): 08/14/2014 9:54:13 AM  Confirm and document reason for call. If symptomatic, describe symptoms. ---caller states her husband may have the flu or a cold - what can she give him OTC until his appt tomorrow. He has a runny nose, is sneezing and has sinus pressure. No fever, but he is aching. She has given advil. She has given him claritin, has mucinex  Has the patient traveled out of the country within the last 30 days? ---Not Applicable  Does the patient require triage? ---Yes  Related visit to physician within the last 2 weeks? ---No  Does the PT have any chronic conditions? (i.e. diabetes, asthma, etc.) ---Yes  List chronic conditions. ---probiotics, paxil( depression) aciflex (gerd)     Guidelines    Guideline Title Affirmed Question Affirmed Notes  Sinus Pain or Congestion [1] Sinus congestion as part of a cold AND [2] present < 10 days (all triage questions negative)    Final Disposition User   [place], RN, Almyra Free                 Encounter MyChart Messages     No messages in this encounter     Routing History     Priority Sent On From To Message Type     08/14/2014 10:48 AM Penni Homans [facility] On Call Pool Patient Calls      Created by     Penni Homans on 08/14/2014 10:39 AM     Contacts       Type Contact Phone    08/14/2014 10:41 AM Phone (Incoming) [name] (Spouse) [phone]

## 2014-08-14 NOTE — Telephone Encounter (Signed)
Patient Name: KERRIE LATOUR  DOB: [birth year]    Nurse Assessment  Nurse: Julien Girt, RN, Almyra Free Date/Time Eilene Ghazi Time): 08/14/2014 9:54:13 AM  Confirm and document reason for call. If symptomatic, describe symptoms. ---caller states her husband may have the flu or a cold - what can she give him OTC until his appt tomorrow. He has a runny nose, is sneezing and has sinus pressure. No fever, but he is aching. She has given advil. She has given him claritin, has mucinex  Has the patient traveled out of the country within the last 30 days? ---Not Applicable  Does the patient require triage? ---Yes  Related visit to physician within the last 2 weeks? ---No  Does the PT have any chronic conditions? (i.e. diabetes, asthma, etc.) ---Yes  List chronic conditions. ---probiotics, paxil( depression) aciflex (gerd)     Guidelines    Guideline Title Affirmed Question Affirmed Notes  Sinus Pain or Congestion [1] Sinus congestion as part of a cold AND [2] present < 10 days (all triage questions negative)    Final Disposition User   [place], RN, Almyra Free

## 2014-08-14 NOTE — Telephone Encounter (Signed)
Pt has appt with Dr Darnell Level on 08/15/14 at 11:15.

## 2014-08-14 NOTE — Telephone Encounter (Signed)
This note was sent to Dr Darnell Level on pts MRN [id number].

## 2014-08-15 ENCOUNTER — Encounter: Payer: Self-pay | Admitting: Family Medicine

## 2014-08-15 ENCOUNTER — Ambulatory Visit (INDEPENDENT_AMBULATORY_CARE_PROVIDER_SITE_OTHER): Payer: BC Managed Care – PPO | Admitting: Family Medicine

## 2014-08-15 VITALS — BP 110/60 | HR 82 | Temp 97.6°F | Wt 155.0 lb

## 2014-08-15 DIAGNOSIS — R69 Illness, unspecified: Principal | ICD-10-CM

## 2014-08-15 DIAGNOSIS — J111 Influenza due to unidentified influenza virus with other respiratory manifestations: Secondary | ICD-10-CM

## 2014-08-15 LAB — POCT INFLUENZA A/B
INFLUENZA A, POC: NEGATIVE
Influenza B, POC: NEGATIVE

## 2014-08-15 MED ORDER — HYDROCOD POLST-CHLORPHEN POLST 10-8 MG/5ML PO LQCR: 5.0000 mL | Freq: Every evening | ORAL | Status: DC | PRN

## 2014-08-15 NOTE — Progress Notes (Signed)
Pre visit review using our clinic review tool, if applicable. No additional management support is needed unless otherwise documented below in the visit note. 

## 2014-08-15 NOTE — Progress Notes (Signed)
BP 110/60 mmHg  Pulse 82  Temp(Src) 97.6 F (36.4 C) (Oral)  Wt 155 lb (70.308 kg)  SpO2 97%   CC: head congestion  Subjective:    Patient ID: Michael [name], male    DOB: 1961-03-18, 54 y.o.   MRN: [id number]  HPI: Michael [name] is a 54 y.o. male presenting on 08/15/2014 for Sinusitis   3d h/o sudden onset sinus congestion, fevers/chills, productive cough with mucous. + sneezing, sinus pressure, tooth pain, ST, dizziness, body aches. + fatigue.  No ear pain. abd pain or chest pain or dyspnea.  No sick contacts at home. 1 ppd smoker. So far has tried advil and nasal saline irrigation. No h/o asthma, COPD  Relevant past medical, surgical, family and social history reviewed and updated as indicated. Interim medical history since our last visit reviewed. Allergies and medications reviewed and updated. Current Outpatient Prescriptions on File Prior to Visit  Medication Sig  . Bioflavonoid Products (ESTER C PO) Take by mouth daily.   . cyanocobalamin 100 MCG tablet Take 100 mcg by mouth every other day.  . fish oil-omega-3 fatty acids 1000 MG capsule Take 2 g by mouth daily.    Marland Kitchen FLUoxetine (PROZAC) 20 MG capsule Take 2 capsules (40 mg total) by mouth daily.  Marland Kitchen loratadine (CLARITIN) 10 MG tablet Take 10 mg by mouth daily as needed.    . Multiple Vitamin (MULTIVITAMIN) tablet Take 1 tablet by mouth daily.    . RABEprazole (ACIPHEX) 20 MG tablet TAKE 1 TABLET (20 MG TOTAL) BY MOUTH DAILY   No current facility-administered medications on file prior to visit.    Review of Systems Per HPI unless specifically indicated above     Objective:    BP 110/60 mmHg  Pulse 82  Temp(Src) 97.6 F (36.4 C) (Oral)  Wt 155 lb (70.308 kg)  SpO2 97%  Wt Readings from Last 3 Encounters:  08/15/14 155 lb (70.308 kg)  07/25/14 156 lb 8 oz (70.988 kg)  03/06/14 153 lb 12 oz (69.741 kg)    Physical Exam  Constitutional: He appears well-developed and well-nourished. No distress.    Evidently congested, tired appearing  HENT:  Head: Normocephalic and atraumatic.  Right Ear: Hearing, tympanic membrane, external ear and ear canal normal.  Left Ear: Hearing, tympanic membrane, external ear and ear canal normal.  Nose: Mucosal edema (and erythema L>R) present. No rhinorrhea. Right sinus exhibits maxillary sinus tenderness. Right sinus exhibits no frontal sinus tenderness. Left sinus exhibits maxillary sinus tenderness. Left sinus exhibits no frontal sinus tenderness.  Mouth/Throat: Uvula is midline and mucous membranes are normal. Posterior oropharyngeal edema and posterior oropharyngeal erythema present. No oropharyngeal exudate or tonsillar abscesses.  Eyes: Conjunctivae and EOM are normal. Pupils are equal, round, and reactive to light. No scleral icterus.  Neck: Normal range of motion. Neck supple.  Cardiovascular: Normal rate, regular rhythm, normal heart sounds and intact distal pulses.   No murmur heard. Pulmonary/Chest: Effort normal and breath sounds normal. No respiratory distress. He has no wheezes. He has no rales.  Lymphadenopathy:    He has no cervical adenopathy.  Skin: Skin is warm and dry. No rash noted.  Nursing note and vitals reviewed.       Assessment & Plan:   Problem List Items Addressed This Visit    Influenza-like illness - Primary    Flu swab today negative.  Anticipate flu like illness vs viral sinusitis. Discussed this as well as anticipated course of resolution.  Update  [place] if fever persistent past next 2d or worsening productive cough.  Red flags to seek care discussed.  Supportive care as per instructions.        Follow up plan: Return if symptoms worsen or fail to improve.

## 2014-08-15 NOTE — Addendum Note (Signed)
Addended by: Royann Shivers A on: 08/15/2014 12:23 PM   Modules accepted: Orders

## 2014-08-15 NOTE — Patient Instructions (Signed)
Flu swab negative today. You have flu-like illness and viral sinusitis. This should get better with time. Push fluids and rest. Out of work for next 2 days. May use plain mucinex or immediate release guaifenesin with plenty of fluid to help mobilize mucous. May use cough syrup at night time. Let us know if fever > 101 persistent past next 2 days, or persistent symptoms towards end of next week. Continue working towards quitting smoking.

## 2014-08-15 NOTE — Assessment & Plan Note (Signed)
Flu swab today negative.  Anticipate flu like illness vs viral sinusitis. Discussed this as well as anticipated course of resolution.  Update [place] if fever persistent past next 2d or worsening productive cough.  Red flags to seek care discussed.  Supportive care as per instructions.

## 2014-08-16 ENCOUNTER — Telehealth: Payer: Self-pay | Admitting: Family Medicine

## 2014-08-16 MED ORDER — AMOXICILLIN-POT CLAVULANATE 875-125 MG PO TABS: 1.0000 | ORAL_TABLET | Freq: Two times a day (BID) | ORAL | Status: AC

## 2014-08-16 NOTE — Telephone Encounter (Signed)
Spoke with patient. He said he just woke up in the middle of the night sweating and assumed he had a temp. He never actually took it. Advised to use ibuprofen and only use abx if worsening over the weekend. He verbalized understanding.

## 2014-08-16 NOTE — Telephone Encounter (Signed)
Pt spouse called to inform you pt now has a fever last night and this morning, and blowing yellow out of his nose. Please advise.  [facility]

## 2014-08-16 NOTE — Telephone Encounter (Signed)
Noted. How high was fever?  Still anticipate viral sinusitis. Would treat as discussed at OV - add ibuprofen 600mg  bid with food if tolerated. rec give more time. If worsening over the weekend however, may fill abx sent to pharmacy (augmentin).

## 2015-05-09 ENCOUNTER — Telehealth: Payer: Self-pay | Admitting: *Deleted

## 2015-05-09 NOTE — Telephone Encounter (Signed)
PA required for Aciphex. Approved and pharmacy notified.

## 2015-07-22 ENCOUNTER — Other Ambulatory Visit: Payer: Self-pay | Admitting: Family Medicine

## 2015-10-19 ENCOUNTER — Other Ambulatory Visit: Payer: Self-pay | Admitting: Family Medicine

## 2015-11-07 ENCOUNTER — Other Ambulatory Visit: Payer: Self-pay | Admitting: *Deleted

## 2015-11-07 MED ORDER — RABEPRAZOLE SODIUM 20 MG PO TBEC: DELAYED_RELEASE_TABLET | ORAL | Status: DC

## 2015-11-07 NOTE — Telephone Encounter (Signed)
Follow up scheduled 04/28.  Approved #30.  Wife aware.

## 2015-12-05 ENCOUNTER — Ambulatory Visit (INDEPENDENT_AMBULATORY_CARE_PROVIDER_SITE_OTHER): Payer: BC Managed Care – PPO | Admitting: Family Medicine

## 2015-12-05 ENCOUNTER — Encounter: Payer: Self-pay | Admitting: Family Medicine

## 2015-12-05 VITALS — BP 112/76 | HR 72 | Temp 97.6°F | Wt 155.8 lb

## 2015-12-05 DIAGNOSIS — F17200 Nicotine dependence, unspecified, uncomplicated: Secondary | ICD-10-CM | POA: Diagnosis not present

## 2015-12-05 DIAGNOSIS — K2100 Gastro-esophageal reflux disease with esophagitis, without bleeding: Secondary | ICD-10-CM

## 2015-12-05 DIAGNOSIS — F4321 Adjustment disorder with depressed mood: Secondary | ICD-10-CM | POA: Diagnosis not present

## 2015-12-05 DIAGNOSIS — H6123 Impacted cerumen, bilateral: Secondary | ICD-10-CM | POA: Diagnosis not present

## 2015-12-05 DIAGNOSIS — H6120 Impacted cerumen, unspecified ear: Secondary | ICD-10-CM | POA: Insufficient documentation

## 2015-12-05 MED ORDER — FLUOXETINE HCL 20 MG PO CAPS: 20.0000 mg | ORAL_CAPSULE | Freq: Every day | ORAL | Status: DC

## 2015-12-05 MED ORDER — DEXLANSOPRAZOLE 60 MG PO CPDR: 60.0000 mg | DELAYED_RELEASE_CAPSULE | Freq: Every day | ORAL | Status: DC

## 2015-12-05 NOTE — Patient Instructions (Addendum)
Try dexilant instead of aciphex. Price out, let me know effect after 1 month. Ok to continue prozac 20mg  daily. Return at your convenience for fasting labs and afterwards for physical. Good to see you today, call us with questions. Ear irrigation today.  Think about quitting smoking.

## 2015-12-05 NOTE — Progress Notes (Signed)
Pre visit review using our clinic review tool, if applicable. No additional management support is needed unless otherwise documented below in the visit note. 

## 2015-12-05 NOTE — Assessment & Plan Note (Signed)
Noticing breakthrough sxs on aciphex, without red flags. Will trial dexilant 60mg  once daily. #30 RF1 sent in.

## 2015-12-05 NOTE — Assessment & Plan Note (Signed)
Improving. Self tapered down to prozac 20mg  daily.

## 2015-12-05 NOTE — Assessment & Plan Note (Signed)
Bilateral cerumen impaction s/p irrigation today.

## 2015-12-05 NOTE — Progress Notes (Signed)
BP 112/76 mmHg  Pulse 72  Temp(Src) 97.6 F (36.4 C) (Oral)  Wt 155 lb 12 oz (70.648 kg)   CC: med refill visit  Subjective:    Patient ID: [name], male    DOB: September 14, 1960, 55 y.o.   MRN: [id number]  HPI: DANNELL LETSCH is a 55 y.o. male presenting on 12/05/2015 for Follow-up and Medication Refill   GERD -on aciphex 20mg  daily for last 10 yrs. EGD 2004 - gastritis, duodenitis, esophagitis. Noticing breakthrough symptoms. Denies early satiety, weight loss, nausea/vomiting, or dysphagia.  Mood - doing well on prozac 20mg  once daily.   Recurrent cerumen impaction - may be affecting hearing, no pain or irritation. H/o irrigation in the past.   Preventative: COLONOSCOPY Date: 2015 5 polyps, rec rpt 5 yrs Fuller Plan) Prostate cancer screening - discussed - would like screening.  Will return for CPE.  Caffeine: 1 cup coffee/yda, Dr Malachi Bonds throughout the day Lives with wife Occupation: HVAC Activity: work, Retail buyer, mows yard Diet: good water, fruits/vegetables daily  Relevant past medical, surgical, family and social history reviewed and updated as indicated. Interim medical history since our last visit reviewed. Allergies and medications reviewed and updated. Current Outpatient Prescriptions on File Prior to Visit  Medication Sig  . Bioflavonoid Products (ESTER C PO) Take by mouth daily.   . cyanocobalamin 100 MCG tablet Take 100 mcg by mouth daily.   . fish oil-omega-3 fatty acids 1000 MG capsule Take 2 g by mouth daily.    Marland Kitchen loratadine (CLARITIN) 10 MG tablet Take 10 mg by mouth daily as needed.    . Multiple Vitamin (MULTIVITAMIN) tablet Take 1 tablet by mouth daily.     No current facility-administered medications on file prior to visit.    Review of Systems Per HPI unless specifically indicated in ROS section     Objective:    BP 112/76 mmHg  Pulse 72  Temp(Src) 97.6 F (36.4 C) (Oral)  Wt 155 lb 12 oz (70.648 kg)  Wt Readings from Last 3 Encounters:    12/05/15 155 lb 12 oz (70.648 kg)  08/15/14 155 lb (70.308 kg)  07/25/14 156 lb 8 oz (70.988 kg)    Physical Exam  Constitutional: He appears well-developed and well-nourished. No distress.  HENT:  Right Ear: Hearing normal.  Left Ear: Hearing normal.  Mouth/Throat: Oropharynx is clear and moist. No oropharyngeal exudate.  Cerumen impaction bilateral TMs  Irrigation performed bilaterally today.  Cardiovascular: Normal rate, regular rhythm, normal heart sounds and intact distal pulses.   No murmur heard. Pulmonary/Chest: Effort normal and breath sounds normal. No respiratory distress. He has no wheezes. He has no rales.  Abdominal: Soft. Normal appearance and bowel sounds are normal. He exhibits no distension and no mass. There is no hepatosplenomegaly. There is no tenderness. There is no rigidity, no rebound, no guarding, no CVA tenderness and negative Murphy's sign.  Musculoskeletal: He exhibits no edema.  Skin: Skin is warm. No rash noted.  Psychiatric: He has a normal mood and affect.  Nursing note and vitals reviewed.     Assessment & Plan:   Problem List Items Addressed This Visit    TOBACCO ABUSE    Continue to encourage cessation.  Will discuss lung cancer screening next visit.      DEPRESSION, SITUATIONAL    Improving. Self tapered down to prozac 20mg  daily.       GERD - Primary    Noticing breakthrough sxs on aciphex, without red flags. Will trial  dexilant 60mg  once daily. #30 RF1 sent in.       Relevant Medications   dexlansoprazole (DEXILANT) 60 MG capsule   Cerumen impaction    Bilateral cerumen impaction s/p irrigation today.          Follow up plan: Return as needed, for annual exam, prior fasting for blood work.  Ria Bush, MD

## 2015-12-05 NOTE — Assessment & Plan Note (Signed)
Continue to encourage cessation.  Will discuss lung cancer screening next visit.

## 2016-01-17 ENCOUNTER — Other Ambulatory Visit: Payer: Self-pay | Admitting: Family Medicine

## 2016-02-18 ENCOUNTER — Other Ambulatory Visit: Payer: Self-pay | Admitting: Family Medicine

## 2016-02-19 NOTE — Telephone Encounter (Signed)
Michael [name] left v/m that fluoxetine rx was denied; spoke with [facility] and rx on hold and will get ready for pick up. Pt notified and voiced understanding and will ck with pharmacy.

## 2016-02-27 ENCOUNTER — Encounter: Payer: Self-pay | Admitting: Family Medicine

## 2016-02-27 ENCOUNTER — Ambulatory Visit (INDEPENDENT_AMBULATORY_CARE_PROVIDER_SITE_OTHER): Payer: BC Managed Care – PPO | Admitting: Family Medicine

## 2016-02-27 VITALS — BP 122/76 | HR 64 | Temp 97.6°F | Ht 70.0 in | Wt 154.5 lb

## 2016-02-27 DIAGNOSIS — K2100 Gastro-esophageal reflux disease with esophagitis, without bleeding: Secondary | ICD-10-CM

## 2016-02-27 DIAGNOSIS — Z13220 Encounter for screening for lipoid disorders: Secondary | ICD-10-CM | POA: Diagnosis not present

## 2016-02-27 DIAGNOSIS — Z0000 Encounter for general adult medical examination without abnormal findings: Secondary | ICD-10-CM

## 2016-02-27 DIAGNOSIS — Z131 Encounter for screening for diabetes mellitus: Secondary | ICD-10-CM | POA: Diagnosis not present

## 2016-02-27 DIAGNOSIS — F17200 Nicotine dependence, unspecified, uncomplicated: Secondary | ICD-10-CM | POA: Diagnosis not present

## 2016-02-27 DIAGNOSIS — Z1159 Encounter for screening for other viral diseases: Secondary | ICD-10-CM

## 2016-02-27 DIAGNOSIS — Z125 Encounter for screening for malignant neoplasm of prostate: Secondary | ICD-10-CM | POA: Diagnosis not present

## 2016-02-27 DIAGNOSIS — F4321 Adjustment disorder with depressed mood: Secondary | ICD-10-CM | POA: Diagnosis not present

## 2016-02-27 LAB — LIPID PANEL
CHOLESTEROL: 168 mg/dL (ref 0–200)
HDL: 33.4 mg/dL — AB (ref >39.00)
LDL Cholesterol: 97 mg/dL (ref 0–99)
NONHDL: 134.2
TRIGLYCERIDES: 186 mg/dL — AB (ref 0.0–149.0)
Total CHOL/HDL Ratio: 5
VLDL: 37.2 mg/dL (ref 0.0–40.0)

## 2016-02-27 LAB — BASIC METABOLIC PANEL
BUN: 12 mg/dL (ref 6–23)
CHLORIDE: 107 meq/L (ref 96–112)
CO2: 27 meq/L (ref 19–32)
CREATININE: 0.83 mg/dL (ref 0.40–1.50)
Calcium: 9.5 mg/dL (ref 8.4–10.5)
GFR: 101.52 mL/min (ref >60.00)
Glucose, Bld: 94 mg/dL (ref 70–99)
POTASSIUM: 3.9 meq/L (ref 3.5–5.1)
SODIUM: 140 meq/L (ref 135–145)

## 2016-02-27 LAB — PSA: PSA: 0.39 ng/mL (ref 0.10–4.00)

## 2016-02-27 NOTE — Assessment & Plan Note (Signed)
Stable on prozac 20mg  daily. Desires to continue at this dose.

## 2016-02-27 NOTE — Assessment & Plan Note (Signed)
Encouraged continued smoking cessation.  Discussed lung cancer screening - he will call us if desired.

## 2016-02-27 NOTE — Progress Notes (Signed)
Pre visit review using our clinic review tool, if applicable. No additional management support is needed unless otherwise documented below in the visit note. 

## 2016-02-27 NOTE — Patient Instructions (Addendum)
Think about lung cancer screening with CT scan, let us know if you'd like referral. Labs today Return as needed or in 1 year for next physical.  Health Maintenance, Male A healthy lifestyle and preventative care can promote health and wellness.  Maintain regular health, dental, and eye exams.  Eat a healthy diet. Foods like vegetables, fruits, whole grains, low-fat dairy products, and lean protein foods contain the nutrients you need and are low in calories. Decrease your intake of foods high in solid fats, added sugars, and salt. Get information about a proper diet from your health care provider, if necessary.  Regular physical exercise is one of the most important things you can do for your health. Most adults should get at least 150 minutes of moderate-intensity exercise (any activity that increases your heart rate and causes you to sweat) each week. In addition, most adults need muscle-strengthening exercises on 2 or more days a week.   Maintain a healthy weight. The body mass index (BMI) is a screening tool to identify possible weight problems. It provides an estimate of body fat based on height and weight. Your health care provider can find your BMI and can help you achieve or maintain a healthy weight. For males 20 years and older:  A BMI below 18.5 is considered underweight.  A BMI of 18.5 to 24.9 is normal.  A BMI of 25 to 29.9 is considered overweight.  A BMI of 30 and above is considered obese.  Maintain normal blood lipids and cholesterol by exercising and minimizing your intake of saturated fat. Eat a balanced diet with plenty of fruits and vegetables. Blood tests for lipids and cholesterol should begin at age 47 and be repeated every 5 years. If your lipid or cholesterol levels are high, you are over age 33, or you are at high risk for heart disease, you may need your cholesterol levels checked more frequently.Ongoing high lipid and cholesterol levels should be treated with  medicines if diet and exercise are not working.  If you smoke, find out from your health care provider how to quit. If you do not use tobacco, do not start.  Lung cancer screening is recommended for adults aged 48-80 years who are at high risk for developing lung cancer because of a history of smoking. A yearly low-dose CT scan of the lungs is recommended for people who have at least a 30-pack-year history of smoking and are current smokers or have quit within the past 15 years. A pack year of smoking is smoking an average of 1 pack of cigarettes a day for 1 year (for example, a 30-pack-year history of smoking could mean smoking 1 pack a day for 30 years or 2 packs a day for 15 years). Yearly screening should continue until the smoker has stopped smoking for at least 15 years. Yearly screening should be stopped for people who develop a health problem that would prevent them from having lung cancer treatment.  If you choose to drink alcohol, do not have more than 2 drinks per day. One drink is considered to be 12 oz (360 mL) of beer, 5 oz (150 mL) of wine, or 1.5 oz (45 mL) of liquor.  Avoid the use of street drugs. Do not share needles with anyone. Ask for help if you need support or instructions about stopping the use of drugs.  High blood pressure causes heart disease and increases the risk of stroke. High blood pressure is more likely to develop in:  People  who have blood pressure in the end of the normal range (100-139/85-89 mm Hg).  People who are overweight or obese.  People who are African American.  If you are 48-18 years of age, have your blood pressure checked every 3-5 years. If you are 47 years of age or older, have your blood pressure checked every year. You should have your blood pressure measured twice--once when you are at a hospital or clinic, and once when you are not at a hospital or clinic. Record the average of the two measurements. To check your blood pressure when you are not  at a hospital or clinic, you can use:  An automated blood pressure machine at a pharmacy.  A home blood pressure monitor.  If you are 71-72 years old, ask your health care provider if you should take aspirin to prevent heart disease.  Diabetes screening involves taking a blood sample to check your fasting blood sugar level. This should be done once every 3 years after age 82 if you are at a normal weight and without risk factors for diabetes. Testing should be considered at a younger age or be carried out more frequently if you are overweight and have at least 1 risk factor for diabetes.  Colorectal cancer can be detected and often prevented. Most routine colorectal cancer screening begins at the age of 78 and continues through age 7. However, your health care provider may recommend screening at an earlier age if you have risk factors for colon cancer. On a yearly basis, your health care provider may provide home test kits to check for hidden blood in the stool. A small camera at the end of a tube may be used to directly examine the colon (sigmoidoscopy or colonoscopy) to detect the earliest forms of colorectal cancer. Talk to your health care provider about this at age 18 when routine screening begins. A direct exam of the colon should be repeated every 5-10 years through age 3, unless early forms of precancerous polyps or small growths are found.  People who are at an increased risk for hepatitis B should be screened for this virus. You are considered at high risk for hepatitis B if:  You were born in a country where hepatitis B occurs often. Talk with your health care provider about which countries are considered high risk.  Your parents were born in a high-risk country and you have not received a shot to protect against hepatitis B (hepatitis B vaccine).  You have HIV or AIDS.  You use needles to inject street drugs.  You live with, or have sex with, someone who has hepatitis B.  You  are a man who has sex with other men (MSM).  You get hemodialysis treatment.  You take certain medicines for conditions like cancer, organ transplantation, and autoimmune conditions.  Hepatitis C blood testing is recommended for all people born from [birth year] through 1965 and any individual with known risk factors for hepatitis C.  Healthy men should no longer receive prostate-specific antigen (PSA) blood tests as part of routine cancer screening. Talk to your health care provider about prostate cancer screening.  Testicular cancer screening is not recommended for adolescents or adult males who have no symptoms. Screening includes self-exam, a health care provider exam, and other screening tests. Consult with your health care provider about any symptoms you have or any concerns you have about testicular cancer.  Practice safe sex. Use condoms and avoid high-risk sexual practices to reduce the spread of sexually  transmitted infections (STIs).  You should be screened for STIs, including gonorrhea and chlamydia if:  You are sexually active and are younger than 24 years.  You are older than 24 years, and your health care provider tells you that you are at risk for this type of infection.  Your sexual activity has changed since you were last screened, and you are at an increased risk for chlamydia or gonorrhea. Ask your health care provider if you are at risk.  If you are at risk of being infected with HIV, it is recommended that you take a prescription medicine daily to prevent HIV infection. This is called pre-exposure prophylaxis (PrEP). You are considered at risk if:  You are a man who has sex with other men (MSM).  You are a heterosexual man who is sexually active with multiple partners.  You take drugs by injection.  You are sexually active with a partner who has HIV.  Talk with your health care provider about whether you are at high risk of being infected with HIV. If you choose to begin  PrEP, you should first be tested for HIV. You should then be tested every 3 months for as long as you are taking PrEP.  Use sunscreen. Apply sunscreen liberally and repeatedly throughout the day. You should seek shade when your shadow is shorter than you. Protect yourself by wearing long sleeves, pants, a wide-brimmed hat, and sunglasses year round whenever you are outdoors.  Tell your health care provider of new moles or changes in moles, especially if there is a change in shape or color. Also, tell your health care provider if a mole is larger than the size of a pencil eraser.  A one-time screening for abdominal aortic aneurysm (AAA) and surgical repair of large AAAs by ultrasound is recommended for men aged 36-75 years who are current or former smokers.  Stay current with your vaccines (immunizations).   This information is not intended to replace advice given to you by your health care provider. Make sure you discuss any questions you have with your health care provider.   Document Released: 01/22/2008 Document Revised: 08/16/2014 Document Reviewed: 12/21/2010 Elsevier Interactive Patient Education [employer].

## 2016-02-27 NOTE — Addendum Note (Signed)
Addended by: Ellamae Sia on: 02/27/2016 08:56 AM   Modules accepted: Miquel Dunn

## 2016-02-27 NOTE — Progress Notes (Signed)
BP 122/76 mmHg  Pulse 64  Temp(Src) 97.6 F (36.4 C) (Oral)  Ht 5\' 10"  (1.778 m)  Wt 154 lb 8 oz (70.081 kg)  BMI 22.17 kg/m2   CC: CPE  Subjective:    Patient ID: Michael [name], male    DOB: 08/06/1961, 55 y.o.   MRN: [id number]  HPI: Michael [name] is a 55 y.o. male presenting on 02/27/2016 for Annual Exam   Smoking - 1 ppd. 40 + PY history.   Preventative: COLONOSCOPY Date: 2015 5 polyps, rec rpt 5 yrs Fuller Plan) Prostate cancer screening - discussed - would like screening.  Lung cancer screening - discussed, will think about this and would like referral.  Flu shot at work Tdap 2015 Seat belt use discussed Sunscreen use discussed. No changing moles on skin.   Caffeine: 1 cup coffee/day, Dr Malachi Bonds throughout the day Lives with wife Occupation: HVAC Activity: work, Retail buyer, mows yard Diet: good water, fruits/vegetables daily  Relevant past medical, surgical, family and social history reviewed and updated as indicated. Interim medical history since our last visit reviewed. Allergies and medications reviewed and updated. Current Outpatient Prescriptions on File Prior to Visit  Medication Sig  . Bioflavonoid Products (ESTER C PO) Take by mouth daily.   . cyanocobalamin 100 MCG tablet Take 100 mcg by mouth daily.   Marland Kitchen DEXILANT 60 MG capsule TAKE 1 CAPSULE (60 MG TOTAL) BY MOUTH DAILY.  . fish oil-omega-3 fatty acids 1000 MG capsule Take 2 g by mouth daily.    Marland Kitchen FLUoxetine (PROZAC) 20 MG capsule Take 1 capsule (20 mg total) by mouth daily.  Marland Kitchen loratadine (CLARITIN) 10 MG tablet Take 10 mg by mouth daily as needed.    . Multiple Vitamin (MULTIVITAMIN) tablet Take 1 tablet by mouth daily.     No current facility-administered medications on file prior to visit.    Review of Systems  Constitutional: Negative for fever, chills, activity change, appetite change, fatigue and unexpected weight change.  HENT: Negative for hearing loss.   Eyes: Negative for visual disturbance.    Respiratory: Positive for cough (am ,?smokers). Negative for chest tightness, shortness of breath and wheezing.   Cardiovascular: Negative for chest pain, palpitations and leg swelling.  Gastrointestinal: Negative for nausea, vomiting, abdominal pain, diarrhea, constipation, blood in stool and abdominal distention.  Genitourinary: Negative for hematuria and difficulty urinating.  Musculoskeletal: Negative for myalgias, arthralgias and neck pain.  Skin: Negative for rash.  Neurological: Negative for dizziness, seizures, syncope and headaches.  Hematological: Negative for adenopathy. Does not bruise/bleed easily.  Psychiatric/Behavioral: Negative for dysphoric mood. The patient is not nervous/anxious.    Per HPI unless specifically indicated in ROS section     Objective:    BP 122/76 mmHg  Pulse 64  Temp(Src) 97.6 F (36.4 C) (Oral)  Ht 5\' 10"  (1.778 m)  Wt 154 lb 8 oz (70.081 kg)  BMI 22.17 kg/m2  Wt Readings from Last 3 Encounters:  02/27/16 154 lb 8 oz (70.081 kg)  12/05/15 155 lb 12 oz (70.648 kg)  08/15/14 155 lb (70.308 kg)    Physical Exam  Constitutional: He is oriented to person, place, and time. He appears well-developed and well-nourished. No distress.  HENT:  Head: Normocephalic and atraumatic.  Right Ear: Hearing, tympanic membrane, external ear and ear canal normal.  Left Ear: Hearing, tympanic membrane, external ear and ear canal normal.  Nose: Nose normal.  Mouth/Throat: Uvula is midline, oropharynx is clear and moist and mucous membranes are normal.  No oropharyngeal exudate, posterior oropharyngeal edema or posterior oropharyngeal erythema.  Eyes: Conjunctivae and EOM are normal. Pupils are equal, round, and reactive to light. No scleral icterus.  Neck: Normal range of motion. Neck supple. No thyromegaly present.  Cardiovascular: Normal rate, regular rhythm, normal heart sounds and intact distal pulses.   No murmur heard. Pulses:      Radial pulses are 2+ on  the right side, and 2+ on the left side.  Pulmonary/Chest: Effort normal and breath sounds normal. No respiratory distress. He has no wheezes. He has no rales.  Abdominal: Soft. Bowel sounds are normal. He exhibits no distension and no mass. There is no tenderness. There is no rebound and no guarding.  Genitourinary: Rectum normal and prostate normal. Rectal exam shows no external hemorrhoid, no internal hemorrhoid, no fissure, no mass, no tenderness and anal tone normal. Prostate is not enlarged (15gm) and not tender.  Slight enlargement R lobe without nodule, induration, tenderness  Musculoskeletal: Normal range of motion. He exhibits no edema.  Lymphadenopathy:    He has no cervical adenopathy.  Neurological: He is alert and oriented to person, place, and time.  CN grossly intact, station and gait intact  Skin: Skin is warm and dry. No rash noted.  Psychiatric: He has a normal mood and affect. His behavior is normal. Judgment and thought content normal.  Nursing note and vitals reviewed.     Assessment & Plan:   Problem List Items Addressed This Visit    DEPRESSION, SITUATIONAL    Stable on prozac 20mg  daily. Desires to continue at this dose.       GERD    Stable on dexilant. Continue.       Healthcare maintenance - Primary    Preventative protocols reviewed and updated unless pt declined. Discussed healthy diet and lifestyle.       TOBACCO ABUSE    Encouraged continued smoking cessation.  Discussed lung cancer screening - he will call us if desired.        Other Visit Diagnoses    Need for hepatitis C screening test        Relevant Orders    Hepatitis C antibody    Lipid screening        Relevant Orders    Lipid panel    Special screening for malignant neoplasm of prostate        Relevant Orders    PSA    Diabetes mellitus screening        Relevant Orders    Basic metabolic panel        Follow up plan: Return in about 1 year (around 02/26/2017), or as needed,  for annual exam, prior fasting for blood work.  Ria Bush, MD

## 2016-02-27 NOTE — Assessment & Plan Note (Signed)
Preventative protocols reviewed and updated unless pt declined. Discussed healthy diet and lifestyle.  

## 2016-02-27 NOTE — Assessment & Plan Note (Signed)
Stable on dexilant. Continue.

## 2016-02-28 LAB — HEPATITIS C ANTIBODY: HCV AB: NEGATIVE

## 2016-02-29 ENCOUNTER — Encounter: Payer: Self-pay | Admitting: Family Medicine

## 2016-02-29 DIAGNOSIS — E785 Hyperlipidemia, unspecified: Secondary | ICD-10-CM | POA: Insufficient documentation

## 2016-02-29 HISTORY — DX: Hyperlipidemia, unspecified: E78.5

## 2016-03-01 ENCOUNTER — Encounter: Payer: Self-pay | Admitting: *Deleted

## 2016-06-16 ENCOUNTER — Other Ambulatory Visit: Payer: Self-pay | Admitting: Family Medicine

## 2016-09-30 ENCOUNTER — Ambulatory Visit (INDEPENDENT_AMBULATORY_CARE_PROVIDER_SITE_OTHER): Payer: BC Managed Care – PPO | Admitting: Family Medicine

## 2016-09-30 ENCOUNTER — Encounter: Payer: Self-pay | Admitting: *Deleted

## 2016-09-30 ENCOUNTER — Encounter: Payer: Self-pay | Admitting: Family Medicine

## 2016-09-30 ENCOUNTER — Ambulatory Visit (INDEPENDENT_AMBULATORY_CARE_PROVIDER_SITE_OTHER)
Admission: RE | Admit: 2016-09-30 | Discharge: 2016-09-30 | Disposition: A | Discharge status: Home / Self Care | Payer: BC Managed Care – PPO | Source: Ambulatory Visit | Attending: Family Medicine | Admitting: Family Medicine

## 2016-09-30 VITALS — BP 124/78 | HR 84 | Temp 98.0°F | Wt 154.5 lb

## 2016-09-30 DIAGNOSIS — M5020 Other cervical disc displacement, unspecified cervical region: Secondary | ICD-10-CM

## 2016-09-30 DIAGNOSIS — S4991XD Unspecified injury of right shoulder and upper arm, subsequent encounter: Secondary | ICD-10-CM

## 2016-09-30 HISTORY — DX: Other cervical disc displacement, unspecified cervical region: M50.20

## 2016-09-30 MED ORDER — PREDNISONE 20 MG PO TABS: ORAL_TABLET | ORAL | 0 refills | Status: DC

## 2016-09-30 NOTE — Progress Notes (Signed)
Pre visit review using our clinic review tool, if applicable. No additional management support is needed unless otherwise documented below in the visit note. 

## 2016-09-30 NOTE — Assessment & Plan Note (Signed)
R shoulder injury sustained at work - anticipate R subacromial bursitis + RTC tendonitis (supraspinatus) but also with neuropathic component ?ulnar compression. No significant improvement after steroid injection. Treat with oral steroid course, continue tramadol. Check xray today.  Will also refer to ortho, keep out of work until seen by ortho. Pt agrees with plan.

## 2016-09-30 NOTE — Progress Notes (Signed)
BP 124/78   Pulse 84   Temp 98 F (36.7 C) (Oral)   Wt 154 lb 8 oz (70.1 kg)   BMI 22.17 kg/m    CC: shoulder pain Subjective:    Patient ID: Michael [name], male    DOB: 04-Sep-1960, 56 y.o.   MRN: [id number]  HPI: PHUOC PELLICANO is a 56 y.o. male presenting on 09/30/2016 for Shoulder Pain (right; heard a "pop" and felt burning while carrying a ladder at work; has been seen by [employer] and they said his claim was denied)   DOI: 09/16/2016 - while at work placed ladder on right shoulder, twisted body to right and felt sudden pop and burning sensation throughout shoulder. Trouble sleeping with increased pain, next morning with increased pain and stiffness. + numbness and pain radiating down arm to fingers, numbness predominantly in ring and pinky fingers.   Saw occupational health worker's comp 09/17/2016 and treated with steroid shot into shoulder and tramadol. Also treating with aleve. Tramadol helps him rest. Returned 1 wk later, referred to orthopedist. Never heard back, yesterday found out worker's comp was denied and no referral would be placed.   Relevant past medical, surgical, family and social history reviewed and updated as indicated. Interim medical history since our last visit reviewed. Allergies and medications reviewed and updated. Outpatient Medications Prior to Visit  Medication Sig Dispense Refill  . Bioflavonoid Products (ESTER C PO) Take by mouth daily.     . cyanocobalamin 100 MCG tablet Take 100 mcg by mouth daily.     Marland Kitchen DEXILANT 60 MG capsule TAKE 1 CAPSULE (60 MG TOTAL) BY MOUTH DAILY. 30 capsule 6  . fish oil-omega-3 fatty acids 1000 MG capsule Take 2 g by mouth daily.      Marland Kitchen FLUoxetine (PROZAC) 20 MG capsule Take 1 capsule (20 mg total) by mouth daily. 90 capsule 3  . loratadine (CLARITIN) 10 MG tablet Take 10 mg by mouth daily as needed.      . Multiple Vitamin (MULTIVITAMIN) tablet Take 1 tablet by mouth daily.       No facility-administered medications  prior to visit.      Per HPI unless specifically indicated in ROS section below Review of Systems     Objective:    BP 124/78   Pulse 84   Temp 98 F (36.7 C) (Oral)   Wt 154 lb 8 oz (70.1 kg)   BMI 22.17 kg/m   Wt Readings from Last 3 Encounters:  09/30/16 154 lb 8 oz (70.1 kg)  02/27/16 154 lb 8 oz (70.1 kg)  12/05/15 155 lb 12 oz (70.6 kg)    Physical Exam  Constitutional: He is oriented to person, place, and time. He appears well-developed and well-nourished. No distress.  Musculoskeletal: He exhibits no edema.  FROM at neck without midline spine or paracervical mm tenderness to palpation L shoulder WNL R Shoulder exam: No deformity of shoulders on inspection. Tender to palpation anterior shoulder at [facility] joint and lateral shoulder and posterior shoulder at bursa Limited ROM in abduction 2/2 pain, FROM forward flexion.  Mild pain with testing SITS in ext/int rotation.  ++ pain with empty can sign.  ++ impingement.  Discomfort with rotation of humeral head in [facility] joint.   Neurological: He is alert and oriented to person, place, and time. He has normal strength. A sensory deficit is present.  Neg spurling Subjective numbness to light touch medial R forearm and 4th/5th palmar digits 5/5 strength BUE  Skin: Skin is warm and dry. No rash noted. No erythema.  Nursing note and vitals reviewed.     Assessment & Plan:   Problem List Items Addressed This Visit    Injury of right shoulder - Primary    R shoulder injury sustained at work - anticipate R subacromial bursitis + RTC tendonitis (supraspinatus) but also with neuropathic component ?ulnar compression. No significant improvement after steroid injection. Treat with oral steroid course, continue tramadol. Check xray today.  Will also refer to ortho, keep out of work until seen by ortho. Pt agrees with plan.      Relevant Orders   DG Shoulder Right   Ambulatory referral to Orthopedic Surgery       Follow up  plan: Return if symptoms worsen or fail to improve.  Ria Bush, MD

## 2016-09-30 NOTE — Patient Instructions (Signed)
Xray today.  Take steroid course. May continue tramadol at night time.  We will also refer you to orthopedist for further evaluation.

## 2016-10-01 ENCOUNTER — Ambulatory Visit (INDEPENDENT_AMBULATORY_CARE_PROVIDER_SITE_OTHER): Payer: Self-pay | Admitting: Orthopaedic Surgery

## 2016-10-04 ENCOUNTER — Ambulatory Visit: Payer: Self-pay | Admitting: Family Medicine

## 2016-12-07 HISTORY — PX: ANTERIOR CERVICAL DISCECTOMY: SHX1160

## 2017-01-15 ENCOUNTER — Other Ambulatory Visit: Payer: Self-pay | Admitting: Family Medicine

## 2017-01-17 NOTE — Telephone Encounter (Signed)
Last CPE 02/27/16, note states to con't medication. No CPE scheduled. OK to refill?

## 2017-01-24 ENCOUNTER — Telehealth: Payer: Self-pay | Admitting: Family Medicine

## 2017-01-24 NOTE — Telephone Encounter (Signed)
Pt dropped of paperwork to be filled out Paperwork in Dr Darnell Level in Box for review and signature

## 2017-01-26 NOTE — Telephone Encounter (Signed)
I spoke with pt he canceled his appointment with dr Erlinda Hong.  He stated he went to [facility] and then [facility]. I ask both [facility] and [facility] to fax notes Notes from [facility] in Dr Darnell Level In box in [place] folder

## 2017-01-26 NOTE — Telephone Encounter (Signed)
We referred to ortho Dr Erlinda Hong but I never received records - need records to complete form. plz request all records from orthopedist. Thanks

## 2017-01-28 NOTE — Telephone Encounter (Signed)
Spoke to spouse she will bring [facility] notes to office

## 2017-01-31 ENCOUNTER — Encounter: Payer: Self-pay | Admitting: Family Medicine

## 2017-01-31 DIAGNOSIS — Z7689 Persons encountering health services in other specified circumstances: Secondary | ICD-10-CM

## 2017-01-31 NOTE — Telephone Encounter (Signed)
Filled and in my outbox

## 2017-01-31 NOTE — Telephone Encounter (Signed)
Pt aware paperwork is ready Pt stated he will pick up paperwork and turn in Copy for pt Copy for file Copy for scan Copy for billing

## 2017-02-06 ENCOUNTER — Other Ambulatory Visit: Payer: Self-pay | Admitting: Family Medicine

## 2017-05-11 ENCOUNTER — Other Ambulatory Visit: Payer: Self-pay | Admitting: Family Medicine

## 2017-05-18 ENCOUNTER — Other Ambulatory Visit: Payer: Self-pay | Admitting: Family Medicine

## 2017-07-15 ENCOUNTER — Ambulatory Visit: Payer: Self-pay | Admitting: *Deleted

## 2017-07-15 NOTE — Telephone Encounter (Signed)
  Reason for Disposition . [1] Sinus pain (not just congestion) AND [2] fever  Answer Assessment - Initial Assessment Questions 1. LOCATION: "Where does it hurt?"      Under eyes, headache 2. ONSET: "When did the sinus pain start?"  (e.g., hours, days)      2 days ago 3. SEVERITY: "How bad is the pain?"   (Scale 1-10; mild, moderate or severe)   - MILD (1-3): doesn't interfere with normal activities    - MODERATE (4-7): interferes with normal activities (e.g., work or school) or awakens from sleep   - SEVERE (8-10): excruciating pain and patient unable to do any normal activities       7 4. RECURRENT SYMPTOM: "Have you ever had sinus problems before?" If so, ask: "When was the last time?" and "What happened that time?"      Yes and not in the recent past.  5. NASAL CONGESTION: "Is the nose blocked?" If so, ask, "Can you open it or must you breathe through the mouth?"    Yes and yes breathing through the mouth 6. NASAL DISCHARGE: "Do you have discharge from your nose?" If so ask, "What color?"    Nasal discharge clear 7. FEVER: "Do you have a fever?" If so, ask: "What is it, how was it measured, and when did it start?"     Fever last night. Did not take it, but cheeks flushed and chills 8. OTHER SYMPTOMS: "Do you have any other symptoms?" (e.g., sore throat, cough, earache, difficulty breathing)    Throat scratchy, non-productive cough. 9. PREGNANCY: "Is there any chance you are pregnant?" "When was your last menstrual period?" na  Protocols used: SINUS PAIN OR CONGESTION-A-AH

## 2017-07-18 ENCOUNTER — Ambulatory Visit: Payer: Self-pay | Admitting: Family Medicine

## 2017-09-14 ENCOUNTER — Ambulatory Visit: Payer: BC Managed Care – PPO | Admitting: Family Medicine

## 2017-09-14 ENCOUNTER — Encounter: Payer: Self-pay | Admitting: Family Medicine

## 2017-09-14 VITALS — BP 120/80 | HR 74 | Temp 98.0°F | Wt 154.0 lb

## 2017-09-14 DIAGNOSIS — S39012A Strain of muscle, fascia and tendon of lower back, initial encounter: Secondary | ICD-10-CM | POA: Insufficient documentation

## 2017-09-14 DIAGNOSIS — R109 Unspecified abdominal pain: Secondary | ICD-10-CM | POA: Diagnosis not present

## 2017-09-14 LAB — POC URINALSYSI DIPSTICK (AUTOMATED)
Bilirubin, UA: NEGATIVE
Blood, UA: NEGATIVE
Glucose, UA: NEGATIVE
KETONES UA: NEGATIVE
LEUKOCYTES UA: NEGATIVE
Nitrite, UA: NEGATIVE
PROTEIN UA: NEGATIVE
SPEC GRAV UA: 1.015 (ref 1.010–1.025)
UROBILINOGEN UA: 0.2 U/dL
pH, UA: 6 (ref 5.0–8.0)

## 2017-09-14 NOTE — Assessment & Plan Note (Signed)
Exam most consistent with R sided lumbar strain. Supportive care reviewed. Continue home treatment with zanaflex, start NSAID, keep hydrocodone PRN breakthrough pain. Discussed heating pad and gentle stretching. Update if not improving with treatment.  Not consistent with shingles or kidney stone today.

## 2017-09-14 NOTE — Progress Notes (Signed)
BP 120/80 (BP Location: Left Arm, Patient Position: Sitting, Cuff Size: Normal)   Pulse 74   Temp 98 F (36.7 C) (Oral)   Wt 154 lb (69.9 kg)   SpO2 97%   BMI 22.10 kg/m    CC: R flank pain Subjective:    Patient ID: Michael [name], male    DOB: 03/17/1961, 57 y.o.   MRN: [id number]  HPI: Michael [name] is a 57 y.o. male presenting on 09/14/2017 for Flank Pain (Right flank pain. Started yesterday. Slow to start urination. )   1d h/o R hip/flank pain without radiation. Worse with movement. Today feeling some better. Noticing increased duration prior to starting urination. Last night treated with hydrocodone. Denies inciting trauma/injury or falls. Pain may have started after lifting heavy box with weights at physical therapy on Monday.   No fevers/chills, dysuria, urgency, frequency. No blood in urine. No nausea/vomiting.   Has had kidney stones in the past.  H/o ACDF C5/6 Vertell Limber) continues going to physical therapy.   Relevant past medical, surgical, family and social history reviewed and updated as indicated. Interim medical history since our last visit reviewed. Allergies and medications reviewed and updated. Outpatient Medications Prior to Visit  Medication Sig Dispense Refill  . Bioflavonoid Products (ESTER C PO) Take by mouth daily.     . cyanocobalamin 100 MCG tablet Take 100 mcg by mouth daily.     Marland Kitchen DEXILANT 60 MG capsule TAKE 1 CAPSULE BY MOUTH EVERY DAY 30 capsule 10  . fish oil-omega-3 fatty acids 1000 MG capsule Take 2 g by mouth daily.      Marland Kitchen FLUoxetine (PROZAC) 20 MG capsule TAKE 1 CAPSULE (20 MG TOTAL) BY MOUTH DAILY. PLEASE SCHEDULE PHYSICAL EXAM 90 capsule 3  . HYDROcodone-acetaminophen (NORCO/VICODIN) 5-325 MG tablet Take 1 tablet by mouth 2 (two) times daily. Takes 1/2 tablet at bedtime as needed.    . loratadine (CLARITIN) 10 MG tablet Take 10 mg by mouth daily as needed.      . Multiple Vitamin (MULTIVITAMIN) tablet Take 1 tablet by mouth daily.      Marland Kitchen  tiZANidine (ZANAFLEX) 2 MG tablet Take 2 tablets by mouth 3 (three) times daily. Takes as needed    . traMADol (ULTRAM) 50 MG tablet Take 50 mg by mouth at bedtime as needed.    . predniSONE (DELTASONE) 20 MG tablet Take two tablets daily for 4 days followed by one tablet daily for 4 days 12 tablet 0   No facility-administered medications prior to visit.      Per HPI unless specifically indicated in ROS section below Review of Systems     Objective:    BP 120/80 (BP Location: Left Arm, Patient Position: Sitting, Cuff Size: Normal)   Pulse 74   Temp 98 F (36.7 C) (Oral)   Wt 154 lb (69.9 kg)   SpO2 97%   BMI 22.10 kg/m   Wt Readings from Last 3 Encounters:  09/14/17 154 lb (69.9 kg)  09/30/16 154 lb 8 oz (70.1 kg)  02/27/16 154 lb 8 oz (70.1 kg)    Physical Exam  Constitutional: He appears well-developed and well-nourished. No distress.  Abdominal: Soft. Bowel sounds are normal. He exhibits no distension and no mass. There is no tenderness. There is no rebound and no guarding.  Musculoskeletal: Normal range of motion. He exhibits no edema.  No pain midline spine Mild R paraspinous mm tenderness Marked tenderness R lower back above buttock Pain with SLR on right  No pain with int/ext rotation at hip. Neg FABER. No pain at SIJ, GTB or sciatic notch bilaterally.   Neurological: He has normal strength. No sensory deficit. Coordination and gait normal.  5/5 strength BLE  Skin: Skin is warm and dry. No rash noted. No erythema.  No vesicular rash  Psychiatric: He has a normal mood and affect.  Nursing note and vitals reviewed.  Results for orders placed or performed in visit on 09/14/17  POCT Urinalysis Dipstick (Automated)  Result Value Ref Range   Color, UA yellow    Clarity, UA clear    Glucose, UA negative    Bilirubin, UA negative    Ketones, UA negative    Spec Grav, UA 1.015 1.010 - 1.025   Blood, UA negative    pH, UA 6.0 5.0 - 8.0   Protein, UA negative     Urobilinogen, UA 0.2 0.2 or 1.0 E.U./dL   Nitrite, UA negative    Leukocytes, UA Negative Negative       Assessment & Plan:   Problem List Items Addressed This Visit    Lumbar strain, initial encounter - Primary    Exam most consistent with R sided lumbar strain. Supportive care reviewed. Continue home treatment with zanaflex, start NSAID, keep hydrocodone PRN breakthrough pain. Discussed heating pad and gentle stretching. Update if not improving with treatment.  Not consistent with shingles or kidney stone today.       Other Visit Diagnoses    Acute right flank pain       Relevant Orders   POCT Urinalysis Dipstick (Automated) (Completed)       Follow up plan: Return if symptoms worsen or fail to improve.  Ria Bush, MD

## 2017-09-14 NOTE — Patient Instructions (Signed)
I think you have pulled a muscle of the lower back  Treat with tizanidine muscle relaxant, ibuprofen 400-600mg  with meals for next 3-5 days, and hydrocodone for breakthrough pain. Use heating pad to area. Gentle stretching of area.  Let us know if not improving with treatment.

## 2018-01-02 ENCOUNTER — Encounter: Payer: Self-pay | Admitting: Family Medicine

## 2018-01-02 DIAGNOSIS — M4802 Spinal stenosis, cervical region: Secondary | ICD-10-CM | POA: Insufficient documentation

## 2018-03-25 ENCOUNTER — Other Ambulatory Visit: Payer: Self-pay | Admitting: Family Medicine

## 2018-05-09 ENCOUNTER — Other Ambulatory Visit: Payer: Self-pay | Admitting: Family Medicine

## 2018-05-09 NOTE — Telephone Encounter (Signed)
Electronic refill Prozac Last office visit 09/14/17/acute Last refill 05/11/17 #90/3 Last CPE 02/27/16

## 2018-06-25 ENCOUNTER — Other Ambulatory Visit: Payer: Self-pay | Admitting: Family Medicine

## 2018-07-05 ENCOUNTER — Other Ambulatory Visit (INDEPENDENT_AMBULATORY_CARE_PROVIDER_SITE_OTHER): Payer: BC Managed Care – PPO

## 2018-07-05 ENCOUNTER — Other Ambulatory Visit: Payer: Self-pay | Admitting: Family Medicine

## 2018-07-05 DIAGNOSIS — Z125 Encounter for screening for malignant neoplasm of prostate: Secondary | ICD-10-CM

## 2018-07-05 DIAGNOSIS — E785 Hyperlipidemia, unspecified: Secondary | ICD-10-CM | POA: Diagnosis not present

## 2018-07-05 LAB — LIPID PANEL
CHOLESTEROL: 181 mg/dL (ref 0–200)
HDL: 33.7 mg/dL — ABNORMAL LOW (ref >39.00)
NonHDL: 147.39
TRIGLYCERIDES: 254 mg/dL — AB (ref 0.0–149.0)
Total CHOL/HDL Ratio: 5
VLDL: 50.8 mg/dL — ABNORMAL HIGH (ref 0.0–40.0)

## 2018-07-05 LAB — COMPREHENSIVE METABOLIC PANEL
ALBUMIN: 4.2 g/dL (ref 3.5–5.2)
ALK PHOS: 103 U/L (ref 39–117)
ALT: 15 U/L (ref 0–53)
AST: 14 U/L (ref 0–37)
BILIRUBIN TOTAL: 0.6 mg/dL (ref 0.2–1.2)
BUN: 9 mg/dL (ref 6–23)
CALCIUM: 9.7 mg/dL (ref 8.4–10.5)
CO2: 29 mEq/L (ref 19–32)
CREATININE: 0.87 mg/dL (ref 0.40–1.50)
Chloride: 104 mEq/L (ref 96–112)
GFR: 95.36 mL/min (ref >60.00)
Glucose, Bld: 91 mg/dL (ref 70–99)
Potassium: 3.8 mEq/L (ref 3.5–5.1)
SODIUM: 140 meq/L (ref 135–145)
TOTAL PROTEIN: 6.7 g/dL (ref 6.0–8.3)

## 2018-07-05 LAB — LDL CHOLESTEROL, DIRECT: Direct LDL: 107 mg/dL

## 2018-07-05 LAB — PSA: PSA: 0.22 ng/mL (ref 0.10–4.00)

## 2018-07-11 ENCOUNTER — Ambulatory Visit (INDEPENDENT_AMBULATORY_CARE_PROVIDER_SITE_OTHER): Payer: BC Managed Care – PPO | Admitting: Family Medicine

## 2018-07-11 ENCOUNTER — Encounter: Payer: Self-pay | Admitting: Family Medicine

## 2018-07-11 VITALS — BP 128/68 | HR 78 | Temp 97.8°F | Ht 67.5 in | Wt 151.2 lb

## 2018-07-11 DIAGNOSIS — N401 Enlarged prostate with lower urinary tract symptoms: Secondary | ICD-10-CM

## 2018-07-11 DIAGNOSIS — N400 Enlarged prostate without lower urinary tract symptoms: Secondary | ICD-10-CM | POA: Insufficient documentation

## 2018-07-11 DIAGNOSIS — H6123 Impacted cerumen, bilateral: Secondary | ICD-10-CM

## 2018-07-11 DIAGNOSIS — Z0000 Encounter for general adult medical examination without abnormal findings: Secondary | ICD-10-CM | POA: Diagnosis not present

## 2018-07-11 DIAGNOSIS — Z23 Encounter for immunization: Secondary | ICD-10-CM | POA: Diagnosis not present

## 2018-07-11 DIAGNOSIS — M4802 Spinal stenosis, cervical region: Secondary | ICD-10-CM

## 2018-07-11 DIAGNOSIS — E785 Hyperlipidemia, unspecified: Secondary | ICD-10-CM

## 2018-07-11 DIAGNOSIS — K2100 Gastro-esophageal reflux disease with esophagitis, without bleeding: Secondary | ICD-10-CM

## 2018-07-11 DIAGNOSIS — R3912 Poor urinary stream: Secondary | ICD-10-CM

## 2018-07-11 DIAGNOSIS — F4321 Adjustment disorder with depressed mood: Secondary | ICD-10-CM

## 2018-07-11 DIAGNOSIS — K4090 Unilateral inguinal hernia, without obstruction or gangrene, not specified as recurrent: Secondary | ICD-10-CM | POA: Insufficient documentation

## 2018-07-11 DIAGNOSIS — R319 Hematuria, unspecified: Secondary | ICD-10-CM | POA: Diagnosis not present

## 2018-07-11 DIAGNOSIS — F17200 Nicotine dependence, unspecified, uncomplicated: Secondary | ICD-10-CM | POA: Diagnosis not present

## 2018-07-11 LAB — POC URINALSYSI DIPSTICK (AUTOMATED)
BILIRUBIN UA: NEGATIVE
Blood, UA: NEGATIVE
Glucose, UA: NEGATIVE
KETONES UA: NEGATIVE
Leukocytes, UA: NEGATIVE
Nitrite, UA: NEGATIVE
PH UA: 6 (ref 5.0–8.0)
PROTEIN UA: NEGATIVE
Urobilinogen, UA: 0.2 E.U./dL

## 2018-07-11 MED ORDER — FLUOXETINE HCL 10 MG PO CAPS: 10.0000 mg | ORAL_CAPSULE | Freq: Every day | ORAL | 0 refills | Status: DC

## 2018-07-11 MED ORDER — NORTRIPTYLINE HCL 25 MG PO CAPS: 25.0000 mg | ORAL_CAPSULE | Freq: Every day | ORAL | 1 refills | Status: DC

## 2018-07-11 NOTE — Assessment & Plan Note (Signed)
Encouraged full smoking cessation. Discussed lung cancer screening - he is in process of possibly switching insurance - I asked him to let us know next month if desires to pursue.

## 2018-07-11 NOTE — Assessment & Plan Note (Signed)
Mild, early symptoms. Will watch for now. PSA stable.

## 2018-07-11 NOTE — Assessment & Plan Note (Signed)
Endorses few episodes of gross hematuria - check UA today and likely refer to uro for further evaluation.

## 2018-07-11 NOTE — Assessment & Plan Note (Signed)
Continue dexilant daily - he is doing better with taking prior to lunch. Discussed trying prior to dinner (largest meal of the day)

## 2018-07-11 NOTE — Progress Notes (Signed)
BP 128/68 (BP Location: Left Arm, Patient Position: Sitting, Cuff Size: Normal)   Pulse 78   Temp 97.8 F (36.6 C) (Oral)   Ht 5' 7.5" (1.715 m)   Wt 151 lb 4 oz (68.6 kg)   SpO2 96%   BMI 23.34 kg/m    CC: CPE Subjective:    Patient ID: Michael [name], male    DOB: 1960-11-06, 57 y.o.   MRN: [id number]  HPI: [name] is a 57 y.o. male presenting on 07/11/2018 for Annual Exam (Pt accompanied by his wife, [name]. )   Had ACDF 12/2016 Vertell Limber). This helped, but has persistent pain and stiffness. Planning to establish with pain management. Takes oxycodone and zanaflex every night to help rest.   Some intermittent R groin discomfort into scrotum for the last 2-3 months mild throbbing pain. No dysuria. Some slowing of stream. No nocturia.   Worsening GERD despite daily dexilant 60mg .  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   Preventative: COLONOSCOPY Date: 2015 5 polyps, rec rpt 5 yrs [facility] Plan) Prostate cancer screening - discussed - would like screening. Lung cancer screening - discussed, will think about this and would like referral. Flu shot yearly Tdap 2015 Seat belt use discussed Sunscreen use discussed. No changing moles on skin.  Smoking - <1ppd (maybe 1/2 ppd) 40+ PY hx Alcohol - none Dentist yearly Eye exam has not seen   Caffeine: 1 cup coffee/day, Dr Malachi Bonds throughout the day Lives with wife Occupation: HVAC Activity: work, Retail buyer, mows yard Diet: good water, fruits/vegetables daily  Relevant past medical, surgical, family and social history reviewed and updated as indicated. Interim medical history since our last visit reviewed. Allergies and medications reviewed and updated. Outpatient Medications Prior to Visit  Medication Sig Dispense Refill  . Bioflavonoid Products (ESTER C PO) Take by mouth daily.     . cyanocobalamin 100 MCG tablet Take 100 mcg by mouth daily.     Marland Kitchen DEXILANT 60 MG capsule TAKE 1  CAPSULE BY MOUTH EVERY DAY 90 capsule 0  . fish oil-omega-3 fatty acids 1000 MG capsule Take 2 g by mouth daily.      Marland Kitchen loratadine (CLARITIN) 10 MG tablet Take 10 mg by mouth daily as needed.      . Multiple Vitamin (MULTIVITAMIN) tablet Take 1 tablet by mouth daily.      Marland Kitchen oxyCODONE-acetaminophen (PERCOCET/ROXICET) 5-325 MG tablet Take 1 tablet by mouth at bedtime.    Marland Kitchen tiZANidine (ZANAFLEX) 2 MG tablet Take 2 tablets by mouth 3 (three) times daily. Takes as needed    . FLUoxetine (PROZAC) 20 MG capsule TAKE 1 CAPSULE (20 MG TOTAL) BY MOUTH DAILY. PLEASE SCHEDULE PHYSICAL EXAM 90 capsule 1  . HYDROcodone-acetaminophen (NORCO/VICODIN) 5-325 MG tablet Take 1 tablet by mouth 2 (two) times daily. Takes 1/2 tablet at bedtime as needed.    . traMADol (ULTRAM) 50 MG tablet Take 50 mg by mouth at bedtime as needed.     No facility-administered medications prior to visit.      Per HPI unless specifically indicated in ROS section below Review of Systems  Constitutional: Negative for activity change, appetite change, chills, fatigue, fever and unexpected weight change.  HENT: Negative for hearing loss.   Eyes: Negative for visual disturbance.  Respiratory: Negative for cough, chest tightness, shortness of breath and wheezing.   Cardiovascular: Negative for chest pain, palpitations and leg swelling.  Gastrointestinal: Positive for abdominal pain (LLQ, mild). Negative for  abdominal distention, blood in stool, constipation, diarrhea, nausea and vomiting.  Genitourinary: Positive for hematuria (noted last week). Negative for difficulty urinating.  Musculoskeletal: Negative for arthralgias, myalgias and neck pain.  Skin: Negative for rash.  Neurological: Negative for dizziness, seizures, syncope and headaches.  Hematological: Negative for adenopathy. Does not bruise/bleed easily.  Psychiatric/Behavioral: Negative for dysphoric mood. The patient is not nervous/anxious.        Objective:    BP 128/68  (BP Location: Left Arm, Patient Position: Sitting, Cuff Size: Normal)   Pulse 78   Temp 97.8 F (36.6 C) (Oral)   Ht 5' 7.5" (1.715 m)   Wt 151 lb 4 oz (68.6 kg)   SpO2 96%   BMI 23.34 kg/m   Wt Readings from Last 3 Encounters:  07/11/18 151 lb 4 oz (68.6 kg)  09/14/17 154 lb (69.9 kg)  09/30/16 154 lb 8 oz (70.1 kg)    Physical Exam  Constitutional: He is oriented to person, place, and time. He appears well-developed and well-nourished. No distress.  HENT:  Head: Normocephalic and atraumatic.  Right Ear: Hearing, tympanic membrane, external ear and ear canal normal.  Left Ear: Hearing, tympanic membrane, external ear and ear canal normal.  Nose: Nose normal.  Mouth/Throat: Uvula is midline, oropharynx is clear and moist and mucous membranes are normal. No oropharyngeal exudate, posterior oropharyngeal edema or posterior oropharyngeal erythema.  Eyes: Pupils are equal, round, and reactive to light. Conjunctivae and EOM are normal. No scleral icterus.  Neck: Normal range of motion. Neck supple.  Cardiovascular: Normal rate, regular rhythm, normal heart sounds and intact distal pulses.  No murmur heard. Pulses:      Radial pulses are 2+ on the right side, and 2+ on the left side.  Pulmonary/Chest: Effort normal and breath sounds normal. No respiratory distress. He has no wheezes. He has no rales.  Abdominal: Soft. Bowel sounds are normal. He exhibits no distension and no mass. There is no tenderness. There is no rebound and no guarding. A hernia is present. Hernia confirmed positive in the right inguinal area (fullness on right). Hernia confirmed negative in the left inguinal area.  Genitourinary: Rectum normal, testes normal and penis normal. Rectal exam shows no external hemorrhoid, no internal hemorrhoid, no fissure, no mass, no tenderness and anal tone normal. Prostate is enlarged (25gm). Prostate is not tender. Right testis shows no mass, no swelling and no tenderness. Right testis  is descended. Left testis shows no mass, no swelling and no tenderness. Left testis is descended. Circumcised.  Musculoskeletal: Normal range of motion. He exhibits no edema.  Lymphadenopathy:    He has no cervical adenopathy.  Neurological: He is alert and oriented to person, place, and time.  CN grossly intact, station and gait intact  Skin: Skin is warm and dry. No rash noted.  Psychiatric: He has a normal mood and affect. His behavior is normal. Judgment and thought content normal.  Nursing note and vitals reviewed.  Results for orders placed or performed in visit on 07/11/18  POCT Urinalysis Dipstick (Automated)  Result Value Ref Range   Color, UA straw    Clarity, UA clear    Glucose, UA Negative Negative   Bilirubin, UA negative    Ketones, UA negative    Spec Grav, UA <=1.005 (A) 1.010 - 1.025   Blood, UA negative    pH, UA 6.0 5.0 - 8.0   Protein, UA Negative Negative   Urobilinogen, UA 0.2 0.2 or 1.0 E.U./dL  Nitrite, UA negative    Leukocytes, UA Negative Negative   Depression screen PHQ 2/9 07/11/2018  Decreased Interest 1  Down, Depressed, Hopeless 0  PHQ - 2 Score 1  Altered sleeping 1  Tired, decreased energy 0  Change in appetite 0  Feeling bad or failure about yourself  0  Trouble concentrating 0  Moving slowly or fidgety/restless 0  Suicidal thoughts 0  PHQ-9 Score 2    GAD 7 : Generalized Anxiety Score 07/11/2018  Nervous, Anxious, on Edge 0  Control/stop worrying 1  Worry too much - different things 1  Trouble relaxing 1  Restless 0  Easily annoyed or irritable 0  Afraid - awful might happen 0  Total GAD 7 Score 3        Assessment & Plan:   Problem List Items Addressed This Visit    TOBACCO ABUSE    Encouraged full smoking cessation. Discussed lung cancer screening - he is in process of possibly switching insurance - I asked him to let us know next month if desires to pursue.       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  Will transition to TCA nortriptyline hopeful for some chronic pain relief. Decrease prozac from 20->10mg  x1 mo then stop. Start nortriptyline 25mg  nightly. Discussed side effects of TCAs to watch for.       Relevant Medications   FLUoxetine (PROZAC) 10 MG capsule   nortriptyline (PAMELOR) 25 MG capsule   Right inguinal hernia    Mild. Offered gen surg referral. He will watch for now.       Hematuria    Endorses few episodes of gross hematuria - check UA today and likely refer to uro for further evaluation.      Relevant Orders   POCT Urinalysis Dipstick (Automated) (Completed)   Healthcare maintenance - Primary    Preventative protocols reviewed and updated unless pt declined. Discussed healthy diet and lifestyle.       GERD    Continue dexilant daily - he is doing better with taking prior to lunch. Discussed trying prior to dinner (largest meal of the day)       Dyslipidemia    Chronic off medications. Discussed goal LDL <100. Consider statin.  The 10-year ASCVD risk score Mikey Bussing DC Brooke Bonito., et al., 2013) is: 15.7%   Values used to calculate the score:     Age: 7 years     Sex: Male     Is Non-Hispanic African American: No     Diabetic: No     Tobacco smoker: Yes     Systolic Blood Pressure: 637 mmHg     Is BP treated: No     HDL Cholesterol: 33.7 mg/dL     Total Cholesterol: 181 mg/dL       Cervical stenosis of spine    S/p cervical decompression/fusion 2018 with residual pain. Will trial nortriptyline. Continues narcotic and muscle relaxant through neurosurgery, planning to establish with pain clinic. RTC 3 mo f/u visit.       Cerumen impaction    Persists      BPH (benign prostatic hyperplasia)    Mild, early symptoms. Will watch for now. PSA stable.        Other Visit Diagnoses    Need for influenza vaccination       Relevant Orders   Flu Vaccine QUAD 36+ mos IM (Completed)       Meds ordered this encounter  Medications  .  FLUoxetine (PROZAC) 10 MG  capsule    Sig: Take 1 capsule (10 mg total) by mouth daily. For 1 week then stop    Dispense:  30 capsule    Refill:  0  . nortriptyline (PAMELOR) 25 MG capsule    Sig: Take 1 capsule (25 mg total) by mouth at bedtime.    Dispense:  90 capsule    Refill:  1   Orders Placed This Encounter  Procedures  . Flu Vaccine QUAD 36+ mos IM  . POCT Urinalysis Dipstick (Automated)    Follow up plan: Return in about 3 months (around 10/10/2018) for follow up visit.  Ria Bush, MD

## 2018-07-11 NOTE — Assessment & Plan Note (Addendum)
S/p cervical decompression/fusion 2018 with residual pain. Will trial nortriptyline. Continues narcotic and muscle relaxant through neurosurgery, planning to establish with pain clinic. RTC 3 mo f/u visit.

## 2018-07-11 NOTE — Assessment & Plan Note (Addendum)
Mild. Offered gen surg referral. He will watch for now.

## 2018-07-11 NOTE — Assessment & Plan Note (Addendum)
Chronic off medications. Discussed goal LDL <100. Consider statin.  The 10-year ASCVD risk score Mikey Bussing DC Brooke Bonito., et al., 2013) is: 15.7%   Values used to calculate the score:     Age: 57 years     Sex: Male     Is Non-Hispanic African American: No     Diabetic: No     Tobacco smoker: Yes     Systolic Blood Pressure: 509 mmHg     Is BP treated: No     HDL Cholesterol: 33.7 mg/dL     Total Cholesterol: 181 mg/dL

## 2018-07-11 NOTE — Assessment & Plan Note (Addendum)
Persists.  

## 2018-07-11 NOTE — Assessment & Plan Note (Signed)
Preventative protocols reviewed and updated unless pt declined. Discussed healthy diet and lifestyle.  

## 2018-07-11 NOTE — Patient Instructions (Addendum)
Flu shot today Urinalysis today - if blood, we will refer you to urology.  Let us know when you want to have lung cancer screening CT - and we will refer.  Let's transition from prozac to nortriptyline (pamelor). Take prozac 10mg  daily for 1 month then stop. Go ahead and start nortriptyline 25mg  nightly for back pain and mood.  Possibly developing right sided inguinal hernia - let us know if you're interested in seeing surgeon for evaluation. Return in 3-4 months for follow up visit  Health Maintenance, Male A healthy lifestyle and preventive care is important for your health and wellness. Ask your health care provider about what schedule of regular examinations is right for you. What should I know about weight and diet? Eat a Healthy Diet  Eat plenty of vegetables, fruits, whole grains, low-fat dairy products, and lean protein.  Do not eat a lot of foods high in solid fats, added sugars, or salt.  Maintain a Healthy Weight Regular exercise can help you achieve or maintain a healthy weight. You should:  Do at least 150 minutes of exercise each week. The exercise should increase your heart rate and make you sweat (moderate-intensity exercise).  Do strength-training exercises at least twice a week.  Watch Your Levels of Cholesterol and Blood Lipids  Have your blood tested for lipids and cholesterol every 5 years starting at 59 years of age. If you are at high risk for heart disease, you should start having your blood tested when you are 59 years old. You may need to have your cholesterol levels checked more often if: ? Your lipid or cholesterol levels are high. ? You are older than 59 years of age. ? You are at high risk for heart disease.  What should I know about cancer screening? Many types of cancers can be detected early and may often be prevented. Lung Cancer  You should be screened every year for lung cancer if: ? You are a current smoker who has smoked for at least 30  years. ? You are a former smoker who has quit within the past 15 years.  Talk to your health care provider about your screening options, when you should start screening, and how often you should be screened.  Colorectal Cancer  Routine colorectal cancer screening usually begins at 59 years of age and should be repeated every 5-10 years until you are 59 years old. You may need to be screened more often if early forms of precancerous polyps or small growths are found. Your health care provider may recommend screening at an earlier age if you have risk factors for colon cancer.  Your health care provider may recommend using home test kits to check for hidden blood in the stool.  A small camera at the end of a tube can be used to examine your colon (sigmoidoscopy or colonoscopy). This checks for the earliest forms of colorectal cancer.  Prostate and Testicular Cancer  Depending on your age and overall health, your health care provider may do certain tests to screen for prostate and testicular cancer.  Talk to your health care provider about any symptoms or concerns you have about testicular or prostate cancer.  Skin Cancer  Check your skin from head to toe regularly.  Tell your health care provider about any new moles or changes in moles, especially if: ? There is a change in a mole's size, shape, or color. ? You have a mole that is larger than a pencil eraser.  Always  use sunscreen. Apply sunscreen liberally and repeat throughout the day.  Protect yourself by wearing long sleeves, pants, a wide-brimmed hat, and sunglasses when outside.  What should I know about heart disease, diabetes, and high blood pressure?  If you are 13-66 years of age, have your blood pressure checked every 3-5 years. If you are 13 years of age or older, have your blood pressure checked every year. You should have your blood pressure measured twice-once when you are at a hospital or clinic, and once when you are  not at a hospital or clinic. Record the average of the two measurements. To check your blood pressure when you are not at a hospital or clinic, you can use: ? An automated blood pressure machine at a pharmacy. ? A home blood pressure monitor.  Talk to your health care provider about your target blood pressure.  If you are between 23-62 years old, ask your health care provider if you should take aspirin to prevent heart disease.  Have regular diabetes screenings by checking your fasting blood sugar level. ? If you are at a normal weight and have a low risk for diabetes, have this test once every three years after the age of 23. ? If you are overweight and have a high risk for diabetes, consider being tested at a younger age or more often.  A one-time screening for abdominal aortic aneurysm (AAA) by ultrasound is recommended for men aged 31-75 years who are current or former smokers. What should I know about preventing infection? Hepatitis B If you have a higher risk for hepatitis B, you should be screened for this virus. Talk with your health care provider to find out if you are at risk for hepatitis B infection. Hepatitis C Blood testing is recommended for:  Everyone born from [birth year] through 1967.  Anyone with known risk factors for hepatitis C.  Sexually Transmitted Diseases (STDs)  You should be screened each year for STDs including gonorrhea and chlamydia if: ? You are sexually active and are younger than 59 years of age. ? You are older than 59 years of age and your health care provider tells you that you are at risk for this type of infection. ? Your sexual activity has changed since you were last screened and you are at an increased risk for chlamydia or gonorrhea. Ask your health care provider if you are at risk.  Talk with your health care provider about whether you are at high risk of being infected with HIV. Your health care provider may recommend a prescription medicine to help  prevent HIV infection.  What else can I do?  Schedule regular health, dental, and eye exams.  Stay current with your vaccines (immunizations).  Do not use any tobacco products, such as cigarettes, chewing tobacco, and e-cigarettes. If you need help quitting, ask your health care provider.  Limit alcohol intake to no more than 2 drinks per day. One drink equals 12 ounces of beer, 5 ounces of wine, or 1 ounces of hard liquor.  Do not use street drugs.  Do not share needles.  Ask your health care provider for help if you need support or information about quitting drugs.  Tell your health care provider if you often feel depressed.  Tell your health care provider if you have ever been abused or do not feel safe at home. This information is not intended to replace advice given to you by your health care provider. Make sure you discuss any questions  you have with your health care provider. Document Released: 01/22/2008 Document Revised: 03/24/2016 Document Reviewed: 04/29/2015 Elsevier Interactive Patient Education  [employer].

## 2018-07-11 NOTE — Assessment & Plan Note (Signed)
Ongoing. On prozac for years. Will transition to TCA nortriptyline hopeful for some chronic pain relief. Decrease prozac from 20->10mg  x1 mo then stop. Start nortriptyline 25mg  nightly. Discussed side effects of TCAs to watch for.

## 2018-07-12 ENCOUNTER — Telehealth: Payer: Self-pay

## 2018-07-12 MED ORDER — FLUOXETINE HCL 10 MG PO CAPS: 10.0000 mg | ORAL_CAPSULE | Freq: Every day | ORAL | 0 refills | Status: DC

## 2018-07-12 NOTE — Telephone Encounter (Addendum)
Left message on vm per dpr relaying results and message below.   Results Urine test returned ok without blood. However, given previously noted blood in urine, would offer urology referral regardless for further evaluation. Let me know if he agrees to this.

## 2018-07-12 NOTE — Telephone Encounter (Signed)
Spoke with pt's wife, [name], relaying Dr. Synthia Innocent message.  Verbalizes understanding.

## 2018-07-12 NOTE — Telephone Encounter (Signed)
Yes they are correct - recommend prozac 10mg  daily for 1 month then stop. I have corrected instructions to pharmacy. They should be able to pick up rest of pills at pharmacy.

## 2018-07-12 NOTE — Telephone Encounter (Signed)
Pt's wife, Michael [name], called stating they received vm about labs.  However, pt declines to see urology at this time.  Also, states they picked up Prozac 10 mg and was given 7 tabs.  But states Dr. Darnell Level stopped them on the way out and told pt to take for 1 mo then stop.  Please advise.

## 2018-08-08 ENCOUNTER — Other Ambulatory Visit: Payer: Self-pay | Admitting: Family Medicine

## 2018-08-16 ENCOUNTER — Other Ambulatory Visit: Payer: Self-pay | Admitting: Family Medicine

## 2018-11-08 ENCOUNTER — Other Ambulatory Visit: Payer: Self-pay

## 2018-11-08 ENCOUNTER — Encounter: Payer: Self-pay | Admitting: Family Medicine

## 2018-11-08 ENCOUNTER — Ambulatory Visit (INDEPENDENT_AMBULATORY_CARE_PROVIDER_SITE_OTHER): Payer: BC Managed Care – PPO | Admitting: Family Medicine

## 2018-11-08 DIAGNOSIS — R0981 Nasal congestion: Secondary | ICD-10-CM | POA: Insufficient documentation

## 2018-11-08 MED ORDER — FLUTICASONE PROPIONATE 50 MCG/ACT NA SUSP: 2.0000 | Freq: Every day | NASAL | 1 refills | Status: DC

## 2018-11-08 NOTE — Progress Notes (Signed)
Virtual Visit via Video Note  I connected with Michael [name] on 11/08/18 at  3:30 PM EDT by a video enabled telemedicine application and verified that I am speaking with the correct person using two identifiers. Patient location: home  My location: office    I discussed the limitations of evaluation and management by telemedicine and the availability of in person appointments. The patient expressed understanding and agreed to proceed.  History of Present Illness: Sneezed about 20 times this am -out in pollen for a few days  Runny nose and now very clogged  Stayed in bed all am (but no fever)  No chills or aches  Pressure in the back of his eyes like a migraine   This am he took a claritin D -stopped him up more   Coughs occasionally -not much / a dry cough  Ears are ok  Throat is ok  Clears his throat a lot /drainage  Mucous is watery /not colored   Has hx of allergies all year  Afrin -used it every night for weeks   Has not used a steroid nasal spray  He already takes narcotic   Wife is checking his temp every hour while awake  Review of Systems  Constitutional: Positive for malaise/fatigue. Negative for chills, diaphoresis and fever.  HENT: Positive for congestion and sinus pain. Negative for ear discharge, ear pain and sore throat.   Eyes: Negative for discharge and redness.  Respiratory: Positive for cough. Negative for sputum production, shortness of breath and wheezing.   Cardiovascular: Negative for chest pain and palpitations.  Gastrointestinal: Negative for nausea and vomiting.  Skin: Negative for itching and rash.  Neurological: Positive for headaches. Negative for dizziness.       Patient Active Problem List   Diagnosis Date Noted  . Nasal congestion 11/08/2018  . BPH (benign prostatic hyperplasia) 07/11/2018  . Right inguinal hernia 07/11/2018  . Hematuria 07/11/2018  . Cervical stenosis of spine 01/02/2018  . HNP (herniated nucleus pulposus), cervical  09/30/2016  . Dyslipidemia 02/29/2016  . Cerumen impaction 12/05/2015  . Renal stone 03/06/2014  . Healthcare maintenance 01/28/2012  . Situational depression 10/08/2009  . GERD 05/23/2009  . PERSONAL HX COLONIC POLYPS 05/23/2009  . INSOMNIA 02/04/2009  . TOBACCO ABUSE 05/01/2007   Past Medical History:  Diagnosis Date  . Adenomatous colon polyp 02/2008  . AK (actinic keratosis)   . Allergy   . Basal cell carcinoma of face    Mohs procedure  . Dyslipidemia 02/29/2016  . Esophagitis 02/13/01  . Gastritis 02/13/01  . GERD (gastroesophageal reflux disease)   . Hemorrhoids   . Hemorrhoids   . Hiatal hernia   . HNP (herniated nucleus pulposus), cervical 09/30/2016   With radiculopathy, s/p anterior discectomy Vertell Limber) 12/2016  . Reactive depression (situational)   . Renal stones   . Smoker    Past Surgical History:  Procedure Laterality Date  . ANTERIOR CERVICAL DISCECTOMY  12/2016   C5/6 (Dr Vertell Limber)  . COLONOSCOPY  02/14/03   polyps, ext hemorrhoids  . COLONOSCOPY  2009   polyps, rec rpt 5 yrs  . COLONOSCOPY  2015   5 polyps, rec rpt 5 yrs Fuller Plan)  . ESOPHAGOGASTRODUODENOSCOPY  02/14/03   esophagitis, gastritis, duodenitis without hemorrhage  . MOHS SURGERY    . TONSILLECTOMY     Social History   Tobacco Use  . Smoking status: Current Every Day Smoker    Packs/day: 1.00    Years: 40.00    Pack  years: 40.00    Types: Cigarettes  . Smokeless tobacco: Never Used  Substance Use Topics  . Alcohol use: No  . Drug use: No   Family History  Problem Relation Age of Onset  . Hypertension Father   . Gout Father   . GER disease Father   . Colon polyps Father   . Breast cancer Mother   . Colon polyps Brother   . Glaucoma Brother        early onset  . Heart attack Paternal Grandfather   . Transient ischemic attack Maternal Grandfather   . Diabetes Neg Hx    Allergies  Allergen Reactions  . Aspirin     REACTION: "did not work"  . Codeine Sulfate     REACTION: "did not  work"   Current Outpatient Medications on File Prior to Visit  Medication Sig Dispense Refill  . Bioflavonoid Products (ESTER C PO) Take by mouth daily.     . cyanocobalamin 100 MCG tablet Take 100 mcg by mouth daily.     Marland Kitchen DEXILANT 60 MG capsule TAKE 1 CAPSULE BY MOUTH EVERY DAY 90 capsule 3  . fish oil-omega-3 fatty acids 1000 MG capsule Take 2 g by mouth daily.      Marland Kitchen loratadine-pseudoephedrine (CLARITIN-D 12-HOUR) 5-120 MG tablet Take 1 tablet by mouth 2 (two) times daily.    . Multiple Vitamin (MULTIVITAMIN) tablet Take 1 tablet by mouth daily.      . nortriptyline (PAMELOR) 25 MG capsule Take 1 capsule (25 mg total) by mouth at bedtime. 90 capsule 1  . oxyCODONE-acetaminophen (PERCOCET/ROXICET) 5-325 MG tablet Take 1 tablet by mouth at bedtime.    Marland Kitchen tiZANidine (ZANAFLEX) 2 MG tablet Take 2 tablets by mouth 3 (three) times daily. Takes as needed     No current facility-administered medications on file prior to visit.     Observations/Objective: Well appearing  Very congested sounding  No cough or hoarseness     Assessment and Plan: Problem List Items Addressed This Visit      Other   Nasal congestion    Suspect due to allergies / viral uri or both  No cough or fever currently (watching for that) He uses afrin every day (months)-disc that this produces rebound congestion and is not optimal  inst to stop this Continue claritin D prn  Add flonase ns daily (sent to pharmacy) Watch for signs of bacterial sinusitis (pt states he is prone)- purulent nasal d/c with worse facial pain plus or minus temp elevation  Update if not starting to improve in a week or if worsening             Follow Up Instructions: Continue claritin D Add flonase nasal spray daily  Steam or nasal saline is ok  Stop afrin as it is causing rebound congestion  No nsaids due to your stomach issues  Drink fluids /rest and watch for cough/fever  Call if symptoms of a bacterial sinus infection  Update  if not starting to improve in a week or if worsening      I discussed the assessment and treatment plan with the patient. The patient was provided an opportunity to ask questions and all were answered. The patient agreed with the plan and demonstrated an understanding of the instructions.   The patient was advised to call back or seek an in-person evaluation if the symptoms worsen or if the condition fails to improve as anticipated.     Loura Pardon, MD

## 2018-11-08 NOTE — Assessment & Plan Note (Signed)
Suspect due to allergies / viral uri or both  No cough or fever currently (watching for that) He uses afrin every day (months)-disc that this produces rebound congestion and is not optimal  inst to stop this Continue claritin D prn  Add flonase ns daily (sent to pharmacy) Watch for signs of bacterial sinusitis (pt states he is prone)- purulent nasal d/c with worse facial pain plus or minus temp elevation  Update if not starting to improve in a week or if worsening

## 2018-11-30 ENCOUNTER — Other Ambulatory Visit: Payer: Self-pay | Admitting: Family Medicine

## 2018-12-18 ENCOUNTER — Encounter: Payer: Self-pay | Admitting: Gastroenterology

## 2019-01-05 ENCOUNTER — Other Ambulatory Visit: Payer: Self-pay | Admitting: Family Medicine

## 2019-01-08 HISTORY — PX: COLONOSCOPY: SHX174

## 2019-01-08 HISTORY — PX: ESOPHAGOGASTRODUODENOSCOPY: SHX1529

## 2019-01-08 NOTE — Telephone Encounter (Signed)
Pt scheduled for 01/08/19 @ 2:45pm

## 2019-01-08 NOTE — Telephone Encounter (Signed)
My apologies it is 01/09/19 @ 2:45

## 2019-01-08 NOTE — Telephone Encounter (Signed)
Pt was due for 3 mo f/u in Mar.  Pls schedule virtual f/u visit.  Send encounter back to Valinda Hoar for refill.

## 2019-01-08 NOTE — Telephone Encounter (Signed)
Noted.  E-scribed refill. 

## 2019-01-09 ENCOUNTER — Encounter: Payer: Self-pay | Admitting: Family Medicine

## 2019-01-09 ENCOUNTER — Ambulatory Visit (INDEPENDENT_AMBULATORY_CARE_PROVIDER_SITE_OTHER): Payer: BC Managed Care – PPO | Admitting: Family Medicine

## 2019-01-09 VITALS — Ht 67.5 in

## 2019-01-09 DIAGNOSIS — F4321 Adjustment disorder with depressed mood: Secondary | ICD-10-CM

## 2019-01-09 DIAGNOSIS — M4802 Spinal stenosis, cervical region: Secondary | ICD-10-CM | POA: Diagnosis not present

## 2019-01-09 DIAGNOSIS — M5020 Other cervical disc displacement, unspecified cervical region: Secondary | ICD-10-CM | POA: Diagnosis not present

## 2019-01-09 NOTE — Assessment & Plan Note (Addendum)
Stable period on nortriptyline 25mg  nightly. Depressed mood stemming from residual chronic pain see above.  Some concern TCA is causing abd cramping side effect - discussed option of stopping or transitioning to different antidepressant however he feels that current management strategy (spacing out meds) is working well and desires to continue current regimen.  Desires med dosing reassessment after upcoming GI eval (EGD/colonoscopy) - will call us with update.

## 2019-01-09 NOTE — Progress Notes (Signed)
Virtual visit completed through [employer]. Due to national recommendations of social distancing due to COVID-19, a virtual visit is felt to be most appropriate for this patient at this time. Reviewed limitations of a virtual visit.   Patient location: home Provider location: [facility] at [facility], office If any vitals were documented, they were collected by patient at home unless specified below.    Ht 5' 7.5" (1.715 m)   BMI 23.34 kg/m    CC: f/u depression Subjective:    Patient ID: Michael [name], male    DOB: 1960/10/07, 58 y.o.   MRN: [id number]  HPI: Michael [name] is a 58 y.o. male presenting on 01/09/2019 for Depression (F/u.)   Situational depression for pain leading to functional limitations - was on prozac 20mg  daily. We transitioned this to TCA nortriptyline 25mg  nightly 07/2018. Overall tolerating well however he finds when he takes TCA in combination with oxycodone and zanaflex, causes abd cramping. Spacing out meds has helped.  Sees pain clinic - improving back pain with dry needling through [facility].   Upcoming colonoscopy/endoscopy 01/30/2019     Relevant past medical, surgical, family and social history reviewed and updated as indicated. Interim medical history since our last visit reviewed. Allergies and medications reviewed and updated. Outpatient Medications Prior to Visit  Medication Sig Dispense Refill  . Bioflavonoid Products (ESTER C PO) Take by mouth daily.     Marland Kitchen DEXILANT 60 MG capsule TAKE 1 CAPSULE BY MOUTH EVERY DAY 90 capsule 3  . fish oil-omega-3 fatty acids 1000 MG capsule Take 2 g by mouth daily.      . fluticasone (FLONASE) 50 MCG/ACT nasal spray Place 2 sprays into both nostrils daily. 16 g 1  . Multiple Vitamin (MULTIVITAMIN) tablet Take 1 tablet by mouth daily.      . nortriptyline (PAMELOR) 25 MG capsule TAKE 1 CAPSULE (25 MG TOTAL) BY MOUTH AT BEDTIME. 90 capsule 0  . oxyCODONE-acetaminophen (PERCOCET/ROXICET) 5-325 MG tablet  Take 1 tablet by mouth at bedtime.    Marland Kitchen tiZANidine (ZANAFLEX) 2 MG tablet Take 2 tablets by mouth 3 (three) times daily. Takes as needed    . loratadine-pseudoephedrine (CLARITIN-D 12-HOUR) 5-120 MG tablet Take 1 tablet by mouth 2 (two) times daily. As needed    . cyanocobalamin 100 MCG tablet Take 100 mcg by mouth daily.      No facility-administered medications prior to visit.      Per HPI unless specifically indicated in ROS section below Review of Systems Objective:    Ht 5' 7.5" (1.715 m)   BMI 23.34 kg/m   Wt Readings from Last 3 Encounters:  07/11/18 151 lb 4 oz (68.6 kg)  09/14/17 154 lb (69.9 kg)  09/30/16 154 lb 8 oz (70.1 kg)     Physical exam: Gen: alert, NAD, not ill appearing Pulm: speaks in complete sentences without increased work of breathing Psych: normal mood, normal thought content       Depression screen [facility] 2/9 01/09/2019 07/11/2018  Decreased Interest 0 1  Down, Depressed, Hopeless 1 0  PHQ - 2 Score 1 1  Altered sleeping 2 1  Tired, decreased energy 0 0  Change in appetite 0 0  Feeling bad or failure about yourself  0 0  Trouble concentrating 0 0  Moving slowly or fidgety/restless 0 0  Suicidal thoughts 0 0  PHQ-9 Score 3 2    GAD 7 : Generalized Anxiety Score 07/11/2018  Nervous, Anxious, on Edge 0  Control/stop  worrying 1  Worry too much - different things 1  Trouble relaxing 1  Restless 0  Easily annoyed or irritable 0  Afraid - awful might happen 0  Total GAD 7 Score 3     Assessment & Plan:   Problem List Items Addressed This Visit    Situational depression - Primary    Stable period on nortriptyline 25mg  nightly. Depressed mood stemming from residual chronic pain see above.  Some concern TCA is causing abd cramping side effect - discussed option of stopping or transitioning to different antidepressant however he feels that current management strategy (spacing out meds) is working well and desires to continue current regimen.  Desires  med dosing reassessment after upcoming GI eval (EGD/colonoscopy) - will call us with update.       HNP (herniated nucleus pulposus), cervical   Cervical stenosis of spine    With residual chronic neck pain. Has established with pain management, undergoing dry needling with some benefit. We started TCA with some benefit as well, desires to continue.           No orders of the defined types were placed in this encounter.  No orders of the defined types were placed in this encounter.   I discussed the assessment and treatment plan with the patient. The patient was provided an opportunity to ask questions and all were answered. The patient agreed with the plan and demonstrated an understanding of the instructions. The patient was advised to call back or seek an in-person evaluation if the symptoms worsen or if the condition fails to improve as anticipated.  Follow up plan: No follow-ups on file.  Ria Bush, MD

## 2019-01-09 NOTE — Assessment & Plan Note (Signed)
With residual chronic neck pain. Has established with pain management, undergoing dry needling with some benefit. We started TCA with some benefit as well, desires to continue.

## 2019-01-10 ENCOUNTER — Other Ambulatory Visit: Payer: Self-pay | Admitting: Family Medicine

## 2019-01-15 ENCOUNTER — Other Ambulatory Visit: Payer: Self-pay

## 2019-01-15 ENCOUNTER — Ambulatory Visit (AMBULATORY_SURGERY_CENTER): Payer: Self-pay | Admitting: *Deleted

## 2019-01-15 VITALS — Ht 67.0 in | Wt 154.0 lb

## 2019-01-15 DIAGNOSIS — Z86010 Personal history of colonic polyps: Secondary | ICD-10-CM

## 2019-01-15 MED ORDER — SUPREP BOWEL PREP KIT 17.5-3.13-1.6 GM/177ML PO SOLN: 1.0000 | Freq: Once | ORAL | 0 refills | Status: AC

## 2019-01-15 NOTE — Progress Notes (Signed)
Patient's previsit per telephone. Wife- Michael [name], also present. ID per dob, name and address  No egg or soy allergy known to patient  No issues with past sedation with any surgeries  or procedures, no intubation problems  No diet pills per patient No home 02 use per patient  No blood thinners per patient  Pt denies issues with constipation , only every 2-3 weeks, patient uses Ducolax No A fib or A flutter  EMMI video sent to pt's e mail  15.00 Suprep coupon sent in packet

## 2019-01-19 ENCOUNTER — Encounter: Payer: Self-pay | Admitting: Gastroenterology

## 2019-01-29 ENCOUNTER — Telehealth: Payer: Self-pay | Admitting: Gastroenterology

## 2019-01-29 NOTE — Telephone Encounter (Signed)
Pt states he is having a lot of stomach issues and has been having them for a couple of months. Reports he has been having a lot stomach cramps and pain.Pt calling to see if he can have EGD done along with colon tomorrow as he cannot afford to have 2 separate procedures. Discussed with pt that if Dr. Fuller Plan feels he needs and EGD done he may need to reschedule the procedure as he is currently only scheduled in a 30 min slot. Please advise.

## 2019-01-29 NOTE — Telephone Encounter (Signed)
We can do an EGD tomorrow with his colonoscopy since my afternoon [facility] schedule has 2 open slots.

## 2019-01-29 NOTE — Telephone Encounter (Signed)
Pt aware, EGD added.

## 2019-01-29 NOTE — Telephone Encounter (Signed)
Patient wife called in stating that she wants her husband to have an endo as well with the schedule colon that is for tomorrow. She is wanting a call back from the nurse to advise thanks.

## 2019-01-29 NOTE — Telephone Encounter (Signed)

## 2019-01-29 NOTE — Telephone Encounter (Signed)
Patient wants to speak to nurse regarding a surgery he had a few years ago

## 2019-01-30 ENCOUNTER — Ambulatory Visit (AMBULATORY_SURGERY_CENTER): Payer: BC Managed Care – PPO | Admitting: Gastroenterology

## 2019-01-30 ENCOUNTER — Encounter: Payer: Self-pay | Admitting: Gastroenterology

## 2019-01-30 ENCOUNTER — Other Ambulatory Visit: Payer: Self-pay

## 2019-01-30 VITALS — BP 119/82 | HR 84 | Temp 98.3°F | Resp 12 | Ht 67.0 in | Wt 151.0 lb

## 2019-01-30 DIAGNOSIS — R1013 Epigastric pain: Secondary | ICD-10-CM

## 2019-01-30 DIAGNOSIS — K449 Diaphragmatic hernia without obstruction or gangrene: Secondary | ICD-10-CM | POA: Diagnosis not present

## 2019-01-30 DIAGNOSIS — Z86010 Personal history of colonic polyps: Secondary | ICD-10-CM | POA: Diagnosis present

## 2019-01-30 DIAGNOSIS — D123 Benign neoplasm of transverse colon: Secondary | ICD-10-CM

## 2019-01-30 DIAGNOSIS — K219 Gastro-esophageal reflux disease without esophagitis: Secondary | ICD-10-CM

## 2019-01-30 DIAGNOSIS — D125 Benign neoplasm of sigmoid colon: Secondary | ICD-10-CM

## 2019-01-30 DIAGNOSIS — K2970 Gastritis, unspecified, without bleeding: Secondary | ICD-10-CM

## 2019-01-30 DIAGNOSIS — D124 Benign neoplasm of descending colon: Secondary | ICD-10-CM

## 2019-01-30 MED ORDER — SODIUM CHLORIDE 0.9 % IV SOLN: 500.0000 mL | Freq: Once | INTRAVENOUS | Status: DC

## 2019-01-30 MED ORDER — FAMOTIDINE 40 MG PO TABS: 40.0000 mg | ORAL_TABLET | Freq: Every day | ORAL | 4 refills | Status: DC

## 2019-01-30 NOTE — Progress Notes (Signed)
Report to PACU, RN, vss, BBS= Clear.  

## 2019-01-30 NOTE — Op Note (Signed)
[place] Patient Name: Esiah Bazinet Procedure Date: 01/30/2019 12:58 PM MRN: [id number] Endoscopist: Ladene Artist , MD Age: 58 Referring MD:  Date of Birth: Dec 28, 1960 Gender: Male Account #: [id number] Procedure:                Upper GI endoscopy Indications:              Epigastric abdominal pain, Gastroesophageal reflux                            disease Medicines:                Monitored Anesthesia Care Procedure:                Pre-Anesthesia Assessment:                           - Prior to the procedure, a History and Physical                            was performed, and patient medications and                            allergies were reviewed. The patient's tolerance of                            previous anesthesia was also reviewed. The risks                            and benefits of the procedure and the sedation                            options and risks were discussed with the patient.                            All questions were answered, and informed consent                            was obtained. Prior Anticoagulants: The patient has                            taken no previous anticoagulant or antiplatelet                            agents. ASA Grade Assessment: II - A patient with                            mild systemic disease. After reviewing the risks                            and benefits, the patient was deemed in                            satisfactory condition to undergo the procedure.  After obtaining informed consent, the endoscope was                            passed under direct vision. Throughout the                            procedure, the patient's blood pressure, pulse, and                            oxygen saturations were monitored continuously. The                            Endoscope was introduced through the mouth, and                            advanced to the second part of duodenum. The  upper                            GI endoscopy was accomplished without difficulty.                            The patient tolerated the procedure well. Scope In: Scope Out: Findings:                 The examined esophagus was normal.                           Patchy mildly erythematous mucosa without bleeding                            was found in the gastric fundus and in the gastric                            body. Biopsies were taken with a cold forceps for                            histology.                           A small hiatal hernia was present.                           The exam of the stomach was otherwise normal.                           The duodenal bulb and second portion of the                            duodenum were normal. Complications:            No immediate complications. Estimated Blood Loss:     Estimated blood loss was minimal. Impression:               - Normal esophagus.                           -  Erythematous mucosa in the gastric fundus and                            gastric body. Biopsied.                           - Small hiatal hernia.                           - Normal duodenal bulb and second portion of the                            duodenum. Recommendation:           - Patient has a contact number available for                            emergencies. The signs and symptoms of potential                            delayed complications were discussed with the                            patient. Return to normal activities tomorrow.                            Written discharge instructions were provided to the                            patient.                           - Resume previous diet.                           - Closely follow antireflux measures.                           - Continue present medications.                           - Await pathology results.                           - Pepcid (famotidine) 40 mg PO hs, 1 year of                             refills. Ladene Artist, MD 01/30/2019 1:40:34 PM This report has been signed electronically.

## 2019-01-30 NOTE — Progress Notes (Signed)
Covid screening and temp done by Riki Sheer. Vital signs done by Rica Mote

## 2019-01-30 NOTE — Progress Notes (Signed)
Called to room to assist during endoscopic procedure.  Patient ID and intended procedure confirmed with present staff. Received instructions for my participation in the procedure from the performing physician.  

## 2019-01-30 NOTE — Progress Notes (Signed)
Pt's states no medical or surgical changes since previsit or office visit. 

## 2019-01-30 NOTE — Op Note (Signed)
[place] Patient Name: Michael [name] Procedure Date: 01/30/2019 1:01 PM MRN: [id number] Endoscopist: Ladene Artist , MD Age: 58 Referring MD:  Date of Birth: 04-10-1961 Gender: Male Account #: [id number] Procedure:                Colonoscopy Indications:              Surveillance: Personal history of adenomatous                            polyps on last colonoscopy 5 years ago Medicines:                Monitored Anesthesia Care Procedure:                Pre-Anesthesia Assessment:                           - Prior to the procedure, a History and Physical                            was performed, and patient medications and                            allergies were reviewed. The patient's tolerance of                            previous anesthesia was also reviewed. The risks                            and benefits of the procedure and the sedation                            options and risks were discussed with the patient.                            All questions were answered, and informed consent                            was obtained. Prior Anticoagulants: The patient has                            taken no previous anticoagulant or antiplatelet                            agents. ASA Grade Assessment: II - A patient with                            mild systemic disease. After reviewing the risks                            and benefits, the patient was deemed in                            satisfactory condition to undergo the procedure.  After obtaining informed consent, the colonoscope                            was passed under direct vision. Throughout the                            procedure, the patient's blood pressure, pulse, and                            oxygen saturations were monitored continuously. The                            Colonoscope was introduced through the anus and                            advanced to the the cecum,  identified by                            appendiceal orifice and ileocecal valve. The                            ileocecal valve, appendiceal orifice, and rectum                            were photographed. The photo of the appendiceal                            orifice did not capture. The quality of the bowel                            preparation was good. The colonoscopy was performed                            without difficulty. The patient tolerated the                            procedure well. Scope In: 1:04:50 PM Scope Out: 1:23:48 PM Scope Withdrawal Time: 0 hours 17 minutes 13 seconds  Total Procedure Duration: 0 hours 18 minutes 58 seconds  Findings:                 The perianal and digital rectal examinations were                            normal.                           Two sessile polyps were found in the transverse                            colon. The polyps were 10 mm in size. These polyps                            were removed with a hot snare. Resection and  retrieval were complete.                           Eight sessile polyps were found in the sigmoid                            colon (2), descending colon (1) and transverse                            colon (5). The polyps were 6 to 8 mm in size. These                            polyps were removed with a cold snare. Resection                            and retrieval were complete.                           Internal hemorrhoids were found during                            retroflexion. The hemorrhoids were small and Grade                            I (internal hemorrhoids that do not prolapse).                           The exam was otherwise without abnormality on                            direct and retroflexion views. Complications:            No immediate complications. Estimated blood loss:                            None. Estimated Blood Loss:     Estimated blood loss:  none. Impression:               - Two 10 mm polyps in the transverse colon, removed                            with a hot snare. Resected and retrieved.                           - Eight 6 to 8 mm polyps in the sigmoid colon, in                            the descending colon and in the transverse colon,                            removed with a cold snare. Resected and retrieved.                           - Internal hemorrhoids.                           -  The examination was otherwise normal on direct                            and retroflexion views. Recommendation:           - Repeat colonoscopy in 2 - 3 years for                            surveillance pending pathology review.                           - Patient has a contact number available for                            emergencies. The signs and symptoms of potential                            delayed complications were discussed with the                            patient. Return to normal activities tomorrow.                            Written discharge instructions were provided to the                            patient.                           - Resume previous diet.                           - Continue present medications.                           - Await pathology results.                           - No aspirin, ibuprofen, naproxen, or other                            non-steroidal anti-inflammatory drugs for 2 weeks                            after polyp removal. Ladene Artist, MD 01/30/2019 1:36:14 PM This report has been signed electronically.

## 2019-01-30 NOTE — Patient Instructions (Signed)
10 polyps removed today and internal hemorrhoids. Follow antireflux measures CLOSELY Await pathology results. Please note 24/7 phone number below circled in red beneath symptoms to report immediately.    YOU HAD AN ENDOSCOPIC PROCEDURE TODAY AT [facility]:   Refer to the procedure report that was given to you for any specific questions about what was found during the examination.  If the procedure report does not answer your questions, please call your gastroenterologist to clarify.  If you requested that your care partner not be given the details of your procedure findings, then the procedure report has been included in a sealed envelope for you to review at your convenience later.  YOU SHOULD EXPECT: Some feelings of bloating in the abdomen. Passage of more gas than usual.  Walking can help get rid of the air that was put into your GI tract during the procedure and reduce the bloating. If you had a lower endoscopy (such as a colonoscopy or flexible sigmoidoscopy) you may notice spotting of blood in your stool or on the toilet paper. If you underwent a bowel prep for your procedure, you may not have a normal bowel movement for a few days.  Please Note:  You might notice some irritation and congestion in your nose or some drainage.  This is from the oxygen used during your procedure.  There is no need for concern and it should clear up in a day or so.  SYMPTOMS TO REPORT IMMEDIATELY:   Following lower endoscopy (colonoscopy or flexible sigmoidoscopy):  Excessive amounts of blood in the stool  Significant tenderness or worsening of abdominal pains  Swelling of the abdomen that is new, acute  Fever of 100F or higher   Following upper endoscopy (EGD)  Vomiting of blood or coffee ground material  New chest pain or pain under the shoulder blades  Painful or persistently difficult swallowing  New shortness of breath  Fever of 100F or higher  Black, tarry-looking  stools  For urgent or emergent issues, a gastroenterologist can be reached at any hour by calling [phone].   DIET:  We do recommend a small meal at first, but then you may proceed to your regular diet.  Drink plenty of fluids but you should avoid alcoholic beverages for 24 hours.  ACTIVITY:  You should plan to take it easy for the rest of today and you should NOT DRIVE or use heavy machinery until tomorrow (because of the sedation medicines used during the test).    FOLLOW UP: Our staff will call the number listed on your records 48-72 hours following your procedure to check on you and address any questions or concerns that you may have regarding the information given to you following your procedure. If we do not reach you, we will leave a message.  We will attempt to reach you two times.  During this call, we will ask if you have developed any symptoms of COVID 19. If you develop any symptoms (ie: fever, flu-like symptoms, shortness of breath, cough etc.) before then, please call [phone].  If you test positive for Covid 19 in the 2 weeks post procedure, please call and report this information to [place].    If any biopsies were taken you will be contacted by phone or by letter within the next 1-3 weeks.  Please call us at [phone] if you have not heard about the biopsies in 3 weeks.    SIGNATURES/CONFIDENTIALITY: You and/or your care partner have signed paperwork  which will be entered into your electronic medical record.  These signatures attest to the fact that that the information above on your After Visit Summary has been reviewed and is understood.  Full responsibility of the confidentiality of this discharge information lies with you and/or your care-partner.

## 2019-02-01 ENCOUNTER — Telehealth: Payer: Self-pay

## 2019-02-01 ENCOUNTER — Telehealth: Payer: Self-pay | Admitting: *Deleted

## 2019-02-01 NOTE — Telephone Encounter (Signed)
1. Have you developed a fever since your procedure? no  2.   Have you had an respiratory symptoms (SOB or cough) since your procedure? no  3.   Have you tested positive for COVID 19 since your procedure no  4.   Have you had any family members/close contacts diagnosed with the COVID 19 since your procedure?  no   If yes to any of these questions please route to Joylene John, RN and Alphonsa Gin, Therapist, sports.  Follow up Call-  Call back number 01/30/2019  Post procedure Call Back phone  # [phone]  Permission to leave phone message Yes  Some recent data might be hidden     Patient questions:  Do you have a fever, pain , or abdominal swelling? No. Pain Score  0 *  Have you tolerated food without any problems? Yes.    Have you been able to return to your normal activities? Yes.    Do you have any questions about your discharge instructions: Diet   No. Medications  No. Follow up visit  No.  Do you have questions or concerns about your Care? No.  Actions: * If pain score is 4 or above: No action needed, pain <4.

## 2019-02-01 NOTE — Telephone Encounter (Signed)
Follow up call after pt's endoscopy procedure the other day, left message for pt to call if he is having problems eating or drinking, if he has a fever or has been diagnosed with covid 19 or exposed to covid.

## 2019-02-05 ENCOUNTER — Encounter: Payer: Self-pay | Admitting: Family Medicine

## 2019-02-07 ENCOUNTER — Encounter: Payer: Self-pay | Admitting: Gastroenterology

## 2019-02-09 ENCOUNTER — Other Ambulatory Visit: Payer: Self-pay | Admitting: Family Medicine

## 2019-02-15 ENCOUNTER — Telehealth: Payer: Self-pay | Admitting: Gastroenterology

## 2019-02-15 NOTE — Telephone Encounter (Signed)
Patient wife called would like to know the pathology results from his procedure.

## 2019-02-15 NOTE — Telephone Encounter (Signed)
Went over pathology results with patient. Patient states he never received. Pathology letter re-mailed to patient.

## 2019-03-13 ENCOUNTER — Other Ambulatory Visit: Payer: Self-pay | Admitting: Family Medicine

## 2019-03-29 ENCOUNTER — Other Ambulatory Visit: Payer: Self-pay | Admitting: Family Medicine

## 2019-03-29 NOTE — Telephone Encounter (Addendum)
Michael [name] left v/m that pain mgt wants pt to take nortriptyline 25 mg taking 2 capsules at night. Pt is needing refill but directions still read 1 capsule at night.Please advise.  Name of Medication: Nortriptyline 25 mg Name of Pharmacy: [facility] or Written Date and Quantity: # 42 on 01/08/19 Last Office Visit and Type: 01/09/19 depression Next Office Visit and Type: none scheduled  Michael [name] called back and said pt has 2 pills for tonight and then will need a refill.

## 2019-03-30 NOTE — Telephone Encounter (Signed)
Best number [phone] [name] (spouse) called checking on rx.  She stated pt is completely out of his meds

## 2019-03-30 NOTE — Telephone Encounter (Signed)
Patient wife called back and stated that the patient is really needing this medication before we closed since he is out

## 2019-03-30 NOTE — Telephone Encounter (Signed)
plz notify I've refilled - to take 1 50mg  capsule daily in place of 2 25mg .

## 2019-03-30 NOTE — Telephone Encounter (Signed)
Spoke with pt relaying Dr. G's message and instructions.  Pt verbalizes understanding. 

## 2019-04-07 ENCOUNTER — Encounter: Payer: Self-pay | Admitting: Family Medicine

## 2019-04-14 ENCOUNTER — Other Ambulatory Visit: Payer: Self-pay | Admitting: Family Medicine

## 2019-05-15 ENCOUNTER — Telehealth: Payer: Self-pay | Admitting: Family Medicine

## 2019-05-15 NOTE — Telephone Encounter (Signed)
Pt dropped off step therapy sheet for medication. Placed in Rx tower for Dr. Danise Mina.

## 2019-05-15 NOTE — Telephone Encounter (Addendum)
Place in Lisa's box.  plz complete PA - patient has previously been on omeprazole and rabeprazole.

## 2019-05-15 NOTE — Telephone Encounter (Signed)
Placed in Dr. G's box.  

## 2019-05-17 NOTE — Telephone Encounter (Signed)
Submitted PA for Dexilant, key:  [id number].  Decision pending.

## 2019-05-28 NOTE — Telephone Encounter (Signed)
Patient called stating that he can not afford the [place] now that his insurance company is not going to cover it. Patient stated the cost to him is over $140. Patient stated that he dropped off a form several weeks ago with names of other medications that they will cover better. Patient wants to know if Dr. Danise Mina will consider one of those?

## 2019-05-29 NOTE — Telephone Encounter (Signed)
Pt's wife, [name], returning call.  States pt was switched to Dexilant because Aciphex was too expensive but says pt loved it.  Pt is willing to try omeprazole again.  Says he does not remember why he stopped it.  But in Jan 2021, pt will need to try one of the generics on the letter provided to Dr. Darnell Level.

## 2019-05-29 NOTE — Telephone Encounter (Signed)
Pt's wife called back stating pt will pick up Dexilant refill and will wait to change to generic in 08/2019.

## 2019-05-29 NOTE — Telephone Encounter (Addendum)
Spoke with pt's ins co asking about PA.  Told no PA is needed.  Just that pt's cost after coverage is still $140 for 90 day rx.  Left message for pt to call back.  Need to find out why Aciphex and omeprazole were stopped and if pt is willing to retry either one.  Also, does he remember why he was switched to [place]?

## 2019-05-29 NOTE — Telephone Encounter (Addendum)
Looks like we never received PA decision - can we check on this?  He was previously on aciphex and omeprazole - is he willing to retry these? Does he remember why they were switched to dexilant?

## 2019-07-26 ENCOUNTER — Other Ambulatory Visit: Payer: Self-pay | Admitting: Family Medicine

## 2019-08-30 ENCOUNTER — Other Ambulatory Visit: Payer: Self-pay | Admitting: Family Medicine

## 2019-08-31 NOTE — Telephone Encounter (Signed)
Left message for patient to call office. Need to schedule CPE & labs.

## 2019-08-31 NOTE — Telephone Encounter (Signed)
Patient scheduled.

## 2019-08-31 NOTE — Telephone Encounter (Signed)
E-scribed refill.  Pls schedule CPE and lab visits.

## 2019-09-03 ENCOUNTER — Telehealth: Payer: Self-pay | Admitting: *Deleted

## 2019-09-03 NOTE — Telephone Encounter (Signed)
Patient's wife left a voicemail stating that he needs a PA done on Dexilant because insurance will no longer cover this medication. [name] stated that the pharmacy has sent over the information to start a prior authorization. Mrs. Dohn wanted to know if Dr. Danise Mina may want to prescribe something different?

## 2019-09-03 NOTE — Telephone Encounter (Signed)
Submitted PA for Dexilant, key:  [id number], PA case ID:  [id number], Rx #:  [id number].  Decision pending.

## 2019-09-04 NOTE — Telephone Encounter (Signed)
Received faxed PA approval valid 09/03/2019- 09/02/2021.

## 2019-09-17 ENCOUNTER — Other Ambulatory Visit: Payer: Self-pay | Admitting: Family Medicine

## 2019-09-20 ENCOUNTER — Other Ambulatory Visit: Payer: Self-pay | Admitting: Family Medicine

## 2019-09-20 DIAGNOSIS — N401 Enlarged prostate with lower urinary tract symptoms: Secondary | ICD-10-CM

## 2019-09-20 DIAGNOSIS — E785 Hyperlipidemia, unspecified: Secondary | ICD-10-CM

## 2019-09-21 ENCOUNTER — Other Ambulatory Visit: Payer: Self-pay

## 2019-09-21 ENCOUNTER — Other Ambulatory Visit (INDEPENDENT_AMBULATORY_CARE_PROVIDER_SITE_OTHER): Payer: BC Managed Care – PPO

## 2019-09-21 DIAGNOSIS — N401 Enlarged prostate with lower urinary tract symptoms: Secondary | ICD-10-CM

## 2019-09-21 DIAGNOSIS — E785 Hyperlipidemia, unspecified: Secondary | ICD-10-CM

## 2019-09-21 DIAGNOSIS — R3912 Poor urinary stream: Secondary | ICD-10-CM

## 2019-09-21 LAB — COMPREHENSIVE METABOLIC PANEL
ALT: 23 U/L (ref 0–53)
AST: 20 U/L (ref 0–37)
Albumin: 4.4 g/dL (ref 3.5–5.2)
Alkaline Phosphatase: 139 U/L — ABNORMAL HIGH (ref 39–117)
BUN: 11 mg/dL (ref 6–23)
CO2: 28 mEq/L (ref 19–32)
Calcium: 10 mg/dL (ref 8.4–10.5)
Chloride: 102 mEq/L (ref 96–112)
Creatinine, Ser: 1.01 mg/dL (ref 0.40–1.50)
GFR: 75.22 mL/min (ref >60.00)
Glucose, Bld: 93 mg/dL (ref 70–99)
Potassium: 4.2 mEq/L (ref 3.5–5.1)
Sodium: 140 mEq/L (ref 135–145)
Total Bilirubin: 0.4 mg/dL (ref 0.2–1.2)
Total Protein: 7 g/dL (ref 6.0–8.3)

## 2019-09-21 LAB — LIPID PANEL
Cholesterol: 187 mg/dL (ref 0–200)
HDL: 42.3 mg/dL (ref >39.00)
NonHDL: 145.04
Total CHOL/HDL Ratio: 4
Triglycerides: 296 mg/dL — ABNORMAL HIGH (ref 0.0–149.0)
VLDL: 59.2 mg/dL — ABNORMAL HIGH (ref 0.0–40.0)

## 2019-09-21 LAB — PSA: PSA: 0.32 ng/mL (ref 0.10–4.00)

## 2019-09-21 LAB — LDL CHOLESTEROL, DIRECT: Direct LDL: 105 mg/dL

## 2019-09-28 ENCOUNTER — Encounter: Payer: BC Managed Care – PPO | Admitting: Family Medicine

## 2019-10-16 ENCOUNTER — Ambulatory Visit (INDEPENDENT_AMBULATORY_CARE_PROVIDER_SITE_OTHER): Payer: BC Managed Care – PPO | Admitting: Family Medicine

## 2019-10-16 ENCOUNTER — Other Ambulatory Visit: Payer: Self-pay

## 2019-10-16 ENCOUNTER — Encounter: Payer: Self-pay | Admitting: Family Medicine

## 2019-10-16 VITALS — BP 120/76 | HR 108 | Temp 98.4°F | Ht 67.75 in | Wt 151.4 lb

## 2019-10-16 DIAGNOSIS — E785 Hyperlipidemia, unspecified: Secondary | ICD-10-CM | POA: Diagnosis not present

## 2019-10-16 DIAGNOSIS — G4700 Insomnia, unspecified: Secondary | ICD-10-CM

## 2019-10-16 DIAGNOSIS — K2100 Gastro-esophageal reflux disease with esophagitis, without bleeding: Secondary | ICD-10-CM

## 2019-10-16 DIAGNOSIS — Z0000 Encounter for general adult medical examination without abnormal findings: Secondary | ICD-10-CM

## 2019-10-16 DIAGNOSIS — F17200 Nicotine dependence, unspecified, uncomplicated: Secondary | ICD-10-CM

## 2019-10-16 DIAGNOSIS — R748 Abnormal levels of other serum enzymes: Secondary | ICD-10-CM | POA: Insufficient documentation

## 2019-10-16 DIAGNOSIS — F4321 Adjustment disorder with depressed mood: Secondary | ICD-10-CM | POA: Diagnosis not present

## 2019-10-16 DIAGNOSIS — R3912 Poor urinary stream: Secondary | ICD-10-CM

## 2019-10-16 DIAGNOSIS — N401 Enlarged prostate with lower urinary tract symptoms: Secondary | ICD-10-CM

## 2019-10-16 DIAGNOSIS — K5903 Drug induced constipation: Secondary | ICD-10-CM | POA: Insufficient documentation

## 2019-10-16 MED ORDER — NORTRIPTYLINE HCL 50 MG PO CAPS: 50.0000 mg | ORAL_CAPSULE | Freq: Every day | ORAL | 3 refills | Status: DC

## 2019-10-16 NOTE — Assessment & Plan Note (Signed)
Preventative protocols reviewed and updated unless pt declined. Discussed healthy diet and lifestyle.  

## 2019-10-16 NOTE — Assessment & Plan Note (Signed)
Sleeping better with nortritpyline.

## 2019-10-16 NOTE — Assessment & Plan Note (Signed)
Anticipate combination of TCA and opiate.  Reviewed with patient.  Prune juice working - will continue this.

## 2019-10-16 NOTE — Assessment & Plan Note (Signed)
Continues dexilant with pepcid 40mg  at night per GI recs.

## 2019-10-16 NOTE — Progress Notes (Signed)
This visit was conducted in person.  BP 120/76 (BP Location: Left Arm, Patient Position: Sitting, Cuff Size: Normal)   Pulse (!) 108   Temp 98.4 F (36.9 C) (Temporal)   Ht 5' 7.75" (1.721 m)   Wt 151 lb 6 oz (68.7 kg)   SpO2 96%   BMI 23.19 kg/m    CC: CPE Subjective:    Patient ID: Michael [name], male    DOB: 1960/11/11, 59 y.o.   MRN: [id number]  HPI: Michael [name] is a 59 y.o. 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 Has applied and been accepted for SS disability - to start 12/08/2019.   Better tolerating nortriptyline 50mg  at night time with food.  Some constipation likely medication related (TCA, opiate).  He takes prune juice at night with benefit.   Preventative: COLONOSCOPY - 01/2019 - multiple polyps including TAs, rpt 2-3 yrs Fuller Plan) Prostate cancer screening - reassuring PSA. No BPH symptoms.  Lung cancer screening - discussed, will think about this and would like referral.  Flu shot yearly Tdap 2015 Shingrix - discussed  Covid - discussed  Seat belt use discussed Sunscreen use discussed. No changing moles on skin.  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 Interim medical history since our last visit reviewed. Allergies and medications reviewed and updated. Outpatient Medications Prior to Visit  Medication Sig Dispense Refill  . Bioflavonoid Products (ESTER C PO) Take by mouth daily.     . cyanocobalamin 100 MCG tablet Take 100 mcg by mouth daily.     Marland Kitchen DEXILANT 60 MG capsule TAKE 1 CAPSULE BY  MOUTH EVERY DAY 90 capsule 0  . famotidine (PEPCID) 40 MG tablet Take 1 tablet (40 mg total) by mouth at bedtime.    . fish oil-omega-3 fatty acids 1000 MG capsule Take 2 g by mouth daily.      . fluticasone (FLONASE) 50 MCG/ACT nasal spray SPRAY 2 SPRAYS INTO EACH NOSTRIL EVERY DAY 48 mL 1  . HORIZANT 300 MG TBCR Take 1 tablet by mouth at bedtime.    . Multiple Vitamin (MULTIVITAMIN) tablet Take 1 tablet by mouth daily.      Marland Kitchen oxyCODONE-acetaminophen (PERCOCET/ROXICET) 5-325 MG tablet Take 1 tablet by mouth at bedtime.    . Probiotic Product (PROBIOTIC PO) Take by mouth.    Marland Kitchen tiZANidine (ZANAFLEX) 2 MG tablet Take 2 tablets by mouth at bedtime. Takes as needed    . famotidine (PEPCID) 40 MG tablet Take 1 tablet (40 mg total) by mouth at bedtime. 90 tablet 4  . nortriptyline (PAMELOR) 50 MG capsule TAKE 1 CAPSULE (50 MG TOTAL) BY MOUTH AT BEDTIME. 90 capsule 0   No facility-administered medications prior to visit.     Per HPI unless specifically indicated in ROS section below Review of Systems  Constitutional: Negative for activity change, appetite change, chills, fatigue, fever and unexpected weight change.  HENT: Negative for hearing loss.   Eyes: Negative for visual disturbance.  Respiratory: Negative for cough, chest tightness, shortness of breath  and wheezing.   Cardiovascular: Negative for chest pain, palpitations and leg swelling.  Gastrointestinal: Negative for abdominal distention, abdominal pain, blood in stool, constipation, diarrhea, nausea and vomiting.  Genitourinary: Negative for difficulty urinating and hematuria.  Musculoskeletal: Positive for neck pain (chronic). Negative for arthralgias and myalgias.  Skin: Negative for rash.  Neurological: Negative for dizziness, seizures, syncope and headaches.  Hematological: Negative for adenopathy. Does not bruise/bleed easily.  Psychiatric/Behavioral: Positive for dysphoric mood. The patient is not nervous/anxious.    Objective:     BP 120/76 (BP Location: Left Arm, Patient Position: Sitting, Cuff Size: Normal)   Pulse (!) 108   Temp 98.4 F (36.9 C) (Temporal)   Ht 5' 7.75" (1.721 m)   Wt 151 lb 6 oz (68.7 kg)   SpO2 96%   BMI 23.19 kg/m   Wt Readings from Last 3 Encounters:  10/16/19 151 lb 6 oz (68.7 kg)  01/30/19 151 lb (68.5 kg)  01/15/19 154 lb (69.9 kg)    Physical Exam Vitals and nursing note reviewed.  Constitutional:      General: He is not in acute distress.    Appearance: Normal appearance. He is well-developed.  HENT:     Right Ear: Hearing, tympanic membrane, ear canal and external ear normal.     Left Ear: Hearing, tympanic membrane, ear canal and external ear normal.     Mouth/Throat:     Pharynx: Uvula midline.  Eyes:     General: No scleral icterus.    Extraocular Movements: Extraocular movements intact.     Conjunctiva/sclera: Conjunctivae normal.     Pupils: Pupils are equal, round, and reactive to light.  Cardiovascular:     Rate and Rhythm: Normal rate and regular rhythm.     Pulses: Normal pulses.          Radial pulses are 2+ on the right side and 2+ on the left side.     Heart sounds: Normal heart sounds. No murmur.  Pulmonary:     Effort: Pulmonary effort is normal. No respiratory distress.     Breath sounds: Normal breath sounds. No wheezing, rhonchi or rales.  Abdominal:     General: Abdomen is flat. Bowel sounds are normal. There is no distension.     Palpations: Abdomen is soft. There is no mass.     Tenderness: There is no abdominal tenderness. There is no guarding or rebound.     Hernia: No hernia is present.  Musculoskeletal:        General: Normal range of motion.     Cervical back: Normal range of motion and neck supple.     Right lower leg: No edema.     Left lower leg: No edema.  Lymphadenopathy:     Cervical: No cervical adenopathy.  Skin:    General: Skin is warm and dry.     Findings: No rash.  Neurological:     General: No focal deficit present.       Mental Status: He is alert and oriented to person, place, and time.     Comments: CN grossly intact, station and gait intact  Psychiatric:        Mood and Affect: Mood normal.        Behavior: Behavior normal.        Thought Content: Thought content normal.        Judgment: Judgment normal.       Results for orders placed or performed in visit on 09/21/19  PSA  Result Value Ref Range   PSA 0.32 0.10 - 4.00 ng/mL  Comprehensive metabolic panel  Result Value Ref Range   Sodium 140 135 - 145 mEq/L   Potassium 4.2 3.5 - 5.1 mEq/L   Chloride 102 96 - 112 mEq/L   CO2 28 19 - 32 mEq/L   Glucose, Bld 93 70 - 99 mg/dL   BUN 11 6 - 23 mg/dL   Creatinine, Ser 1.01 0.40 - 1.50 mg/dL   Total Bilirubin 0.4 0.2 - 1.2 mg/dL   Alkaline Phosphatase 139 (H) 39 - 117 U/L   AST 20 0 - 37 U/L   ALT 23 0 - 53 U/L   Total Protein 7.0 6.0 - 8.3 g/dL   Albumin 4.4 3.5 - 5.2 g/dL   GFR 75.22 >60.00 mL/min   Calcium 10.0 8.4 - 10.5 mg/dL  Lipid panel  Result Value Ref Range   Cholesterol 187 0 - 200 mg/dL   Triglycerides 296.0 (H) 0.0 - 149.0 mg/dL   HDL 42.30 >39.00 mg/dL   VLDL 59.2 (H) 0.0 - 40.0 mg/dL   Total CHOL/HDL Ratio 4    NonHDL 145.04   LDL cholesterol, direct  Result Value Ref Range   Direct LDL 105.0 mg/dL   Assessment & Plan:  This visit occurred during the SARS-CoV-2 public health emergency.  Safety protocols were in place, including screening questions prior to the visit, additional usage of staff PPE, and extensive cleaning of exam room while observing appropriate contact time as indicated for disinfecting solutions.   Problem List Items Addressed This Visit    TOBACCO ABUSE    Continue to encourage full cessation.  Interested in lung cancer screen - will refer.       Relevant Orders   Ambulatory Referral for Lung Cancer Scre   Situational depression    Stable period on nightly TCA - continue.       Relevant Medications   nortriptyline (PAMELOR) 50 MG capsule    INSOMNIA    Sleeping better with nortritpyline.       Healthcare maintenance - Primary    Preventative protocols reviewed and updated unless pt declined. Discussed healthy diet and lifestyle.       GERD    Continues dexilant with pepcid 40mg  at night per GI recs.       Relevant Medications   famotidine (PEPCID) 40 MG tablet   Elevated alkaline phosphatase level    First elevation, mild. Patient asymptomatic. Will monitor.       Dyslipidemia    Chronic off meds. Offered statin for aggressive treatment. Declines at this time - will work towards healthy diet changes.  The 10-year ASCVD risk score Mikey Bussing DC Brooke Bonito., et al., 2013) is: 13%   Values used to calculate the score:     Age: 57 years     Sex: Male     Is Non-Hispanic African American: No     Diabetic: No     Tobacco smoker: Yes     Systolic Blood Pressure: 123456 mmHg     Is BP treated: No     HDL Cholesterol: 42.3 mg/dL     Total Cholesterol: 187 mg/dL       Constipation due to pain medication    Anticipate combination of TCA and opiate.  Reviewed with patient.  Prune juice working - will continue this.       BPH (benign prostatic hyperplasia)    Largely asymptomatic.  Meds ordered this encounter  Medications  . nortriptyline (PAMELOR) 50 MG capsule    Sig: Take 1 capsule (50 mg total) by mouth at bedtime.    Dispense:  90 capsule    Refill:  3   Orders Placed This Encounter  Procedures  . Ambulatory Referral for Lung Cancer Scre    Referral Priority:   Routine    Referral Type:   Consultation    Referral Reason:   Specialty Services Required    Number of Visits Requested:   1    Patient instructions: We will refer you to lung cancer screening CT program.  Work on cutting down and quitting.  If interested, check with pharmacy about new 2 shot shingles series (shingrix).  Return once you receive medicare, return for a welcome to medicare visit.  Work on [employer] choices to improve  cholesterol levels especially triglycerides.   Follow up plan: Return in about 1 year (around 10/15/2020) for medicare wellness visit, annual exam, prior fasting for blood work.  Ria Bush, MD

## 2019-10-16 NOTE — Assessment & Plan Note (Signed)
Continue to encourage full cessation.  Interested in lung cancer screen - will refer.

## 2019-10-16 NOTE — Assessment & Plan Note (Addendum)
First elevation, mild. Patient asymptomatic. Will monitor.

## 2019-10-16 NOTE — Patient Instructions (Addendum)
We will refer you to lung cancer screening CT program.  Work on cutting down and quitting.  If interested, check with pharmacy about new 2 shot shingles series (shingrix).  Return once you receive medicare, return for a welcome to medicare visit.  Work on [employer] choices to improve cholesterol levels especially triglycerides.   Health Maintenance, Male Adopting a healthy lifestyle and getting preventive care are important in promoting health and wellness. Ask your health care provider about:  The right schedule for you to have regular tests and exams.  Things you can do on your own to prevent diseases and keep yourself healthy. What should I know about diet, weight, and exercise? Eat a healthy diet   Eat a diet that includes plenty of vegetables, fruits, low-fat dairy products, and lean protein.  Do not eat a lot of foods that are high in solid fats, added sugars, or sodium. Maintain a healthy weight Body mass index (BMI) is a measurement that can be used to identify possible weight problems. It estimates body fat based on height and weight. Your health care provider can help determine your BMI and help you achieve or maintain a healthy weight. Get regular exercise Get regular exercise. This is one of the most important things you can do for your health. Most adults should:  Exercise for at least 150 minutes each week. The exercise should increase your heart rate and make you sweat (moderate-intensity exercise).  Do strengthening exercises at least twice a week. This is in addition to the moderate-intensity exercise.  Spend less time sitting. Even light physical activity can be beneficial. Watch cholesterol and blood lipids Have your blood tested for lipids and cholesterol at 61 years of age, then have this test every 5 years. You may need to have your cholesterol levels checked more often if:  Your lipid or cholesterol levels are high.  You are older than 61 years of  age.  You are at high risk for heart disease. What should I know about cancer screening? Many types of cancers can be detected early and may often be prevented. Depending on your health history and family history, you may need to have cancer screening at various ages. This may include screening for:  Colorectal cancer.  Prostate cancer.  Skin cancer.  Lung cancer. What should I know about heart disease, diabetes, and high blood pressure? Blood pressure and heart disease  High blood pressure causes heart disease and increases the risk of stroke. This is more likely to develop in people who have high blood pressure readings, are of African descent, or are overweight.  Talk with your health care provider about your target blood pressure readings.  Have your blood pressure checked: ? Every 3-5 years if you are 75-25 years of age. ? Every year if you are 64 years old or older.  If you are between the ages of 77 and 40 and are a current or former smoker, ask your health care provider if you should have a one-time screening for abdominal aortic aneurysm (AAA). Diabetes Have regular diabetes screenings. This checks your fasting blood sugar level. Have the screening done:  Once every three years after age 94 if you are at a normal weight and have a low risk for diabetes.  More often and at a younger age if you are overweight or have a high risk for diabetes. What should I know about preventing infection? Hepatitis B If you have a higher risk for hepatitis B, you should  be screened for this virus. Talk with your health care provider to find out if you are at risk for hepatitis B infection. Hepatitis C Blood testing is recommended for:  Everyone born from [birth year] through 1967.  Anyone with known risk factors for hepatitis C. Sexually transmitted infections (STIs)  You should be screened each year for STIs, including gonorrhea and chlamydia, if: ? You are sexually active and are younger  than 61 years of age. ? You are older than 61 years of age and your health care provider tells you that you are at risk for this type of infection. ? Your sexual activity has changed since you were last screened, and you are at increased risk for chlamydia or gonorrhea. Ask your health care provider if you are at risk.  Ask your health care provider about whether you are at high risk for HIV. Your health care provider may recommend a prescription medicine to help prevent HIV infection. If you choose to take medicine to prevent HIV, you should first get tested for HIV. You should then be tested every 3 months for as long as you are taking the medicine. Follow these instructions at home: Lifestyle  Do not use any products that contain nicotine or tobacco, such as cigarettes, e-cigarettes, and chewing tobacco. If you need help quitting, ask your health care provider.  Do not use street drugs.  Do not share needles.  Ask your health care provider for help if you need support or information about quitting drugs. Alcohol use  Do not drink alcohol if your health care provider tells you not to drink.  If you drink alcohol: ? Limit how much you have to 0-2 drinks a day. ? Be aware of how much alcohol is in your drink. In the [place] one drink equals one 12 oz bottle of beer (355 mL), one 5 oz glass of wine (148 mL), or one 1 oz glass of hard liquor (44 mL). General instructions  Schedule regular health, dental, and eye exams.  Stay current with your vaccines.  Tell your health care provider if: ? You often feel depressed. ? You have ever been abused or do not feel safe at home. Summary  Adopting a healthy lifestyle and getting preventive care are important in promoting health and wellness.  Follow your health care provider's instructions about healthy diet, exercising, and getting tested or screened for diseases.  Follow your health care provider's instructions on monitoring your  cholesterol and blood pressure. This information is not intended to replace advice given to you by your health care provider. Make sure you discuss any questions you have with your health care provider. Document Revised: 07/19/2018 Document Reviewed: 07/19/2018 Elsevier Patient Education  2020 [employer].

## 2019-10-16 NOTE — Assessment & Plan Note (Signed)
Stable period on nightly TCA - continue.

## 2019-10-16 NOTE — Assessment & Plan Note (Addendum)
Chronic off meds. Offered statin for aggressive treatment. Declines at this time - will work towards healthy diet changes.  The 10-year ASCVD risk score Mikey Bussing DC Brooke Bonito., et al., 2013) is: 13%   Values used to calculate the score:     Age: 60 years     Sex: Male     Is Non-Hispanic African American: No     Diabetic: No     Tobacco smoker: Yes     Systolic Blood Pressure: 123456 mmHg     Is BP treated: No     HDL Cholesterol: 42.3 mg/dL     Total Cholesterol: 187 mg/dL

## 2019-10-16 NOTE — Assessment & Plan Note (Addendum)
Largely asymptomatic

## 2019-10-22 ENCOUNTER — Telehealth: Payer: Self-pay | Admitting: *Deleted

## 2019-10-22 NOTE — Telephone Encounter (Signed)
Received referral for low dose lung cancer screening CT scan. Message left at phone number listed in EMR for patient to call me back to facilitate scheduling scan.  

## 2019-10-28 ENCOUNTER — Ambulatory Visit: Payer: BC Managed Care – PPO | Attending: Internal Medicine

## 2019-11-02 ENCOUNTER — Ambulatory Visit: Payer: BC Managed Care – PPO | Attending: Internal Medicine

## 2019-11-02 DIAGNOSIS — Z23 Encounter for immunization: Secondary | ICD-10-CM

## 2019-11-02 NOTE — Progress Notes (Signed)
   Covid-19 Vaccination Clinic  Name:  Michael [name]    MRN: [id number] DOB: 03/21/1961  11/02/2019  [name] was observed post Covid-19 immunization for 15 minutes without incident. He was provided with Vaccine Information Sheet and instruction to access the V-Safe system.   [name] was instructed to call 911 with any severe reactions post vaccine: Marland Kitchen Difficulty breathing  . Swelling of face and throat  . A fast heartbeat  . A bad rash all over body  . Dizziness and weakness   Immunizations Administered    Name Date Dose VIS Date Route   Pfizer COVID-19 Vaccine 11/02/2019 11:03 AM 0.3 mL 07/20/2019 Intramuscular   Manufacturer: [place]   Lot: [id number]   [place]: [id number]

## 2019-11-12 ENCOUNTER — Other Ambulatory Visit: Payer: Self-pay | Admitting: Family Medicine

## 2019-11-16 ENCOUNTER — Telehealth: Payer: Self-pay | Admitting: *Deleted

## 2019-11-16 DIAGNOSIS — Z87891 Personal history of nicotine dependence: Secondary | ICD-10-CM

## 2019-11-16 NOTE — Telephone Encounter (Signed)
Received referral for initial lung cancer screening scan. Contacted patient and obtained smoking history,(current, 30 pack year) as well as answering questions related to screening process. Patient denies signs of lung cancer such as weight loss or hemoptysis. Patient denies comorbidity that would prevent curative treatment if lung cancer were found. Patient is scheduled for shared decision making visit and CT scan on 01/01/20 at 115pm (delay due to covid vaccine and radiology recommendation for waiting 4 weeks from vaccine to schedule).

## 2019-11-27 ENCOUNTER — Ambulatory Visit: Payer: BC Managed Care – PPO | Attending: Internal Medicine

## 2019-11-27 DIAGNOSIS — Z23 Encounter for immunization: Secondary | ICD-10-CM

## 2019-11-27 NOTE — Progress Notes (Signed)
   Covid-19 Vaccination Clinic  Name:  [name]    MRN: [id number] DOB: 15-Feb-1961  11/27/2019  [name] was observed post Covid-19 immunization for 15 minutes without incident. He was provided with Vaccine Information Sheet and instruction to access the V-Safe system.   [name] was instructed to call 911 with any severe reactions post vaccine: Marland Kitchen Difficulty breathing  . Swelling of face and throat  . A fast heartbeat  . A bad rash all over body  . Dizziness and weakness   Immunizations Administered    Name Date Dose VIS Date Route   Pfizer COVID-19 Vaccine 11/27/2019  9:25 AM 0.3 mL 10/03/2018 Intramuscular   Manufacturer: [place]   Lot: [id number]   [place]: [id number]

## 2020-01-01 ENCOUNTER — Inpatient Hospital Stay: Payer: Medicare PPO | Attending: Oncology | Admitting: Hospice and Palliative Medicine

## 2020-01-01 ENCOUNTER — Ambulatory Visit
Admission: RE | Admit: 2020-01-01 | Discharge: 2020-01-01 | Disposition: A | Discharge status: Home / Self Care | Payer: Medicare PPO | Source: Ambulatory Visit | Attending: Oncology | Admitting: Oncology

## 2020-01-01 ENCOUNTER — Other Ambulatory Visit: Payer: Self-pay

## 2020-01-01 DIAGNOSIS — F17210 Nicotine dependence, cigarettes, uncomplicated: Secondary | ICD-10-CM | POA: Diagnosis not present

## 2020-01-01 DIAGNOSIS — Z87891 Personal history of nicotine dependence: Secondary | ICD-10-CM | POA: Insufficient documentation

## 2020-01-01 NOTE — Progress Notes (Signed)
Virtual Visit via Video Note  I connected with Michael [name] on 01/01/20 at  1:15 PM EDT by a video enabled telemedicine application and verified that I am speaking with the correct person using two identifiers.  Location: Patient: [facility] Provider: Clinic    I discussed the limitations of evaluation and management by telemedicine and the availability of in person appointments. The patient expressed understanding and agreed to proceed.  I discussed the assessment and treatment plan with the patient. The patient was provided an opportunity to ask questions and all were answered. The patient agreed with the plan and demonstrated an understanding of the instructions.   The patient was advised to call back or seek an in-person evaluation if the symptoms worsen or if the condition fails to improve as anticipated.   In accordance with CMS guidelines, patient has met eligibility criteria including age, absence of signs or symptoms of lung cancer.  Social History   Tobacco Use  . Smoking status: Current Every Day Smoker    Packs/day: 0.75    Years: 40.00    Pack years: 30.00    Types: Cigarettes  . Smokeless tobacco: Never Used  Substance Use Topics  . Alcohol use: No  . Drug use: No      A shared decision-making session was conducted prior to the performance of CT scan. This includes one or more decision aids, includes benefits and harms of screening, follow-up diagnostic testing, over-diagnosis, false positive rate, and total radiation exposure.   Counseling on the importance of adherence to annual lung cancer LDCT screening, impact of co-morbidities, and ability or willingness to undergo diagnosis and treatment is imperative for compliance of the program.   Counseling on the importance of continued smoking cessation for former smokers; the importance of smoking cessation for current smokers, and information about tobacco cessation interventions have been given to patient including [place] and [facility] quit [place] programs.   Written order for lung cancer screening with LDCT has been given to the patient and any and all questions have been answered to the best of my abilities.    Yearly follow up will be coordinated by Burgess Estelle, Thoracic Navigator.  I provided 15 minutes of face-to-face video visit time during this encounter, and > 50% was spent counseling as documented under my assessment & plan.   Altha Harm, NP

## 2020-01-03 ENCOUNTER — Telehealth: Payer: Self-pay | Admitting: *Deleted

## 2020-01-03 NOTE — Telephone Encounter (Signed)
Notified patient of LDCT lung cancer screening program results with recommendation for 6 month follow up imaging. Also notified of incidental findings noted below and is encouraged to discuss further with PCP who will receive a copy of this note and/or the CT report. Patient verbalizes understanding.   IMPRESSION: 1. Lung-RADS 3, probably benign findings. Multiple scattered solid right pulmonary nodules, largest 7.5 mm in the right middle lobe along the minor fissure. Short-term follow-up in 6 months is recommended with repeat low-dose chest CT without contrast (please use the following order, "CT CHEST LCS NODULE FOLLOW-UP W/O CM"). 2.  Emphysema (ICD10-J43.9).

## 2020-01-17 ENCOUNTER — Other Ambulatory Visit: Payer: Self-pay | Admitting: Family Medicine

## 2020-02-12 ENCOUNTER — Encounter: Payer: Self-pay | Admitting: Family Medicine

## 2020-02-12 DIAGNOSIS — R918 Other nonspecific abnormal finding of lung field: Secondary | ICD-10-CM | POA: Insufficient documentation

## 2020-02-12 DIAGNOSIS — J439 Emphysema, unspecified: Secondary | ICD-10-CM | POA: Insufficient documentation

## 2020-03-26 ENCOUNTER — Encounter: Payer: Self-pay | Admitting: Family Medicine

## 2020-04-02 ENCOUNTER — Encounter: Payer: Self-pay | Admitting: Family Medicine

## 2020-04-02 ENCOUNTER — Other Ambulatory Visit: Payer: Self-pay | Admitting: Ophthalmology

## 2020-04-02 DIAGNOSIS — H4710 Unspecified papilledema: Secondary | ICD-10-CM

## 2020-04-10 ENCOUNTER — Other Ambulatory Visit: Payer: Self-pay | Admitting: Family Medicine

## 2020-04-12 ENCOUNTER — Other Ambulatory Visit: Payer: Self-pay | Admitting: Gastroenterology

## 2020-04-12 DIAGNOSIS — K2100 Gastro-esophageal reflux disease with esophagitis, without bleeding: Secondary | ICD-10-CM

## 2020-04-15 ENCOUNTER — Telehealth: Payer: Self-pay | Admitting: Gastroenterology

## 2020-04-15 DIAGNOSIS — K2100 Gastro-esophageal reflux disease with esophagitis, without bleeding: Secondary | ICD-10-CM

## 2020-04-15 MED ORDER — FAMOTIDINE 40 MG PO TABS: 40.0000 mg | ORAL_TABLET | Freq: Every day | ORAL | 0 refills | Status: DC

## 2020-04-15 NOTE — Telephone Encounter (Signed)
Patients wife called requesting refill on Pepcid

## 2020-04-15 NOTE — Telephone Encounter (Signed)
Informed patient's wife that patient needs to schedule follow up visit with Dr. Fuller Plan. Patient's wife agreed and will call back to schedule. Prescription sent to pharmacy.

## 2020-04-23 ENCOUNTER — Other Ambulatory Visit: Payer: Medicare PPO

## 2020-05-08 ENCOUNTER — Other Ambulatory Visit: Payer: Self-pay | Admitting: Gastroenterology

## 2020-05-08 DIAGNOSIS — K2100 Gastro-esophageal reflux disease with esophagitis, without bleeding: Secondary | ICD-10-CM

## 2020-06-27 ENCOUNTER — Encounter: Payer: Self-pay | Admitting: *Deleted

## 2020-07-08 ENCOUNTER — Telehealth: Payer: Self-pay | Admitting: *Deleted

## 2020-07-08 NOTE — Telephone Encounter (Signed)
Attempted to contact and schedule lung screening scan. Message left for patient to call back to schedule. 

## 2020-07-30 ENCOUNTER — Telehealth: Payer: Self-pay | Admitting: *Deleted

## 2020-07-30 ENCOUNTER — Encounter: Payer: Self-pay | Admitting: *Deleted

## 2020-07-30 NOTE — Telephone Encounter (Signed)
Attempted to contact and schedule lung screening scan. Message left for patient to call back to schedule. 

## 2020-10-16 ENCOUNTER — Ambulatory Visit: Payer: Medicare PPO | Admitting: Internal Medicine

## 2020-10-16 ENCOUNTER — Encounter: Payer: Self-pay | Admitting: Internal Medicine

## 2020-10-16 ENCOUNTER — Other Ambulatory Visit: Payer: Self-pay

## 2020-10-16 VITALS — BP 120/70 | HR 105 | Temp 98.2°F | Wt 155.0 lb

## 2020-10-16 DIAGNOSIS — H6123 Impacted cerumen, bilateral: Secondary | ICD-10-CM

## 2020-10-16 NOTE — Patient Instructions (Signed)

## 2020-10-16 NOTE — Progress Notes (Signed)
Subjective:    Patient ID: Michael [name], male    DOB: 08-Feb-1961, 60 y.o.   MRN: [id number]  HPI  Pt presents to the clinic today with c/o right ear pain. This started 3 to 4 days ago.  He describes the pain as fullness with loss of hearing.  He has not noticed any drainage from the ear but does have a history of wax buildup.  He denies headache, runny nose, nasal congestion, sore throat or cough.  He denies fever, chills or body aches.  He has tried Flonase OTC with minimal relief of symptoms.  Review of Systems  Past Medical History:  Diagnosis Date  . Adenomatous colon polyp 02/2008  . AK (actinic keratosis)   . Allergy   . Basal cell carcinoma of face    Mohs procedure  . Dyslipidemia 02/29/2016  . Esophagitis 02/13/01  . Gastritis 02/13/01  . GERD (gastroesophageal reflux disease)   . Hemorrhoids   . Hemorrhoids   . Hiatal hernia   . HNP (herniated nucleus pulposus), cervical 09/30/2016   With radiculopathy, s/p anterior discectomy Vertell Limber) 12/2016  . Reactive depression (situational)   . Renal stones   . Smoker     Current Outpatient Medications  Medication Sig Dispense Refill  . Bioflavonoid Products (ESTER C PO) Take by mouth daily.     . cyanocobalamin 100 MCG tablet Take 100 mcg by mouth daily.     Marland Kitchen DEXILANT 60 MG capsule TAKE 1 CAPSULE BY MOUTH EVERY DAY 90 capsule 3  . famotidine (PEPCID) 40 MG tablet Take 1 tablet (40 mg total) by mouth at bedtime. 30 tablet 0  . fish oil-omega-3 fatty acids 1000 MG capsule Take 2 g by mouth daily.      . fluticasone (FLONASE) 50 MCG/ACT nasal spray SPRAY 2 SPRAYS INTO EACH NOSTRIL EVERY DAY 16 mL 5  . HORIZANT 300 MG TBCR Take 1 tablet by mouth at bedtime.    . Multiple Vitamin (MULTIVITAMIN) tablet Take 1 tablet by mouth daily.      . nortriptyline (PAMELOR) 50 MG capsule TAKE 1 CAPSULE BY MOUTH AT BEDTIME. 90 capsule 4  . oxyCODONE-acetaminophen (PERCOCET/ROXICET) 5-325 MG tablet Take 1 tablet by mouth at bedtime.    .  Probiotic Product (PROBIOTIC PO) Take by mouth.    Marland Kitchen tiZANidine (ZANAFLEX) 2 MG tablet Take 2 tablets by mouth at bedtime. Takes as needed     No current facility-administered medications for this visit.    Allergies  Allergen Reactions  . Aspirin     REACTION: "did not work"  . Codeine Sulfate     REACTION: "did not work"    Family History  Problem Relation Age of Onset  . Hypertension Father   . Gout Father   . GER disease Father   . Colon polyps Father   . Breast cancer Mother   . Colon polyps Brother   . Glaucoma Brother        early onset  . Heart attack Paternal Grandfather   . Transient ischemic attack Maternal Grandfather   . Diabetes Neg Hx   . Colon cancer Neg Hx   . Esophageal cancer Neg Hx   . Rectal cancer Neg Hx   . Stomach cancer Neg Hx     Social History   Socioeconomic History  . Marital status: Married    Spouse name: Not on file  . Number of children: 0  . Years of education: Not on file  . Highest  education level: Not on file  Occupational History  . 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 Smoking status: Current Every Day Smoker    Packs/day: 0.75    Years: 40.00    Pack years: 30.00    Types: Cigarettes  . Smokeless tobacco: Never Used  Vaping Use  . Vaping Use: Never used  Substance and Sexual Activity  . Alcohol use: No  . Drug use: No  . Sexual activity: Not on file  Other Topics Concern  . Not on file  Social History Narrative   Caffeine: 1 cup coffee/yda, Dr Malachi Bonds throughout the day   Lives with wife   Occupation: HVAC   Activity: work, Retail buyer, mows yard   Diet: good water, fruits/vegetables daily   Social Determinants of Radio broadcast assistant Strain: Not on Art therapist Insecurity: Not on file  Transportation Needs: Not on file  Physical Activity: Not on file  Stress: Not on file  Social Connections: Not on file  Intimate Partner Violence: Not on file     Constitutional: Denies fever, malaise,  fatigue, headache or abrupt weight changes.  HEENT: Pt reports right ear pain. Denies eye pain, eye redness, ringing in the ears, runny nose, nasal congestion, bloody nose, or sore throat. Respiratory: Denies difficulty breathing, shortness of breath, cough or sputum production.   Cardiovascular: Denies chest pain, chest tightness, palpitations or swelling in the hands or feet.   No other specific complaints in a complete review of systems (except as listed in HPI above).     Objective:   Physical Exam  BP 120/70   Pulse (!) 105   Temp 98.2 F (36.8 C) (Temporal)   Wt 155 lb (70.3 kg)   SpO2 96%   BMI 24.28 kg/m   Wt Readings from Last 3 Encounters:  01/01/20 151 lb (68.5 kg)  10/16/19 151 lb 6 oz (68.7 kg)  01/30/19 151 lb (68.5 kg)    General: Appears his stated age, well developed, well nourished in NAD. HEENT: Head: normal shape and size; Ears: bilateral cerumen impaction, R>L;  Neck:  No adenopathy noted. Cardiovascular: Tachycardic with normal rhythm.  Pulmonary/Chest: Normal effort and positive vesicular breath sounds.  Neurological: Alert and oriented.    BMET    Component Value Date/Time   NA 140 09/21/2019 0759   K 4.2 09/21/2019 0759   CL 102 09/21/2019 0759   CO2 28 09/21/2019 0759   GLUCOSE 93 09/21/2019 0759   BUN 11 09/21/2019 0759   CREATININE 1.01 09/21/2019 0759   CALCIUM 10.0 09/21/2019 0759   GFRNONAA 97.00 04/21/2010 0853    Lipid Panel     Component Value Date/Time   CHOL 187 09/21/2019 0759   TRIG 296.0 (H) 09/21/2019 0759   HDL 42.30 09/21/2019 0759   CHOLHDL 4 09/21/2019 0759   VLDL 59.2 (H) 09/21/2019 0759   LDLCALC 97 02/27/2016 0856    CBC    Component Value Date/Time   WBC 11.2 (H) 02/02/2013 0827   RBC 4.65 02/02/2013 0827   HGB 15.0 02/02/2013 0827   HCT 44.0 02/02/2013 0827   PLT 265.0 02/02/2013 0827   MCV 94.6 02/02/2013 0827   MCHC 34.1 02/02/2013 0827   RDW 13.1 02/02/2013 0827   LYMPHSABS 2.3 02/02/2013 0827    MONOABS 0.5 02/02/2013 0827   EOSABS 0.2 02/02/2013 0827   BASOSABS 0.0 02/02/2013 0827    Hgb A1C No results found for: HGBA1C  Assessment & Plan:   Hearing Loss Secondary to Bilateral Cerumen Impaction:  Manual lavage by CMA, pt tolerated well Can try Debrox 2 x week to prevent wax buildup  Return precautions discussed   Webb Silversmith, NP This visit occurred during the SARS-CoV-2 public health emergency.  Safety protocols were in place, including screening questions prior to the visit, additional usage of staff PPE, and extensive cleaning of exam room while observing appropriate contact time as indicated for disinfecting solutions.

## 2020-10-21 ENCOUNTER — Other Ambulatory Visit: Payer: Self-pay | Admitting: Family Medicine

## 2020-10-21 ENCOUNTER — Ambulatory Visit: Payer: Medicare PPO | Admitting: Family Medicine

## 2020-10-21 ENCOUNTER — Other Ambulatory Visit: Payer: Self-pay

## 2020-10-21 ENCOUNTER — Encounter: Payer: Self-pay | Admitting: Family Medicine

## 2020-10-21 VITALS — BP 120/82 | HR 105 | Temp 98.5°F | Ht 67.75 in | Wt 153.5 lb

## 2020-10-21 DIAGNOSIS — H60391 Other infective otitis externa, right ear: Secondary | ICD-10-CM | POA: Diagnosis not present

## 2020-10-21 MED ORDER — AMOXICILLIN 500 MG PO CAPS
1000.0000 mg | ORAL_CAPSULE | Freq: Two times a day (BID) | ORAL | 0 refills | Status: DC
Start: 2020-10-21 — End: 2020-12-03

## 2020-10-21 NOTE — Progress Notes (Signed)
Patient ID: Michael [name], male    DOB: 07/07/1961, 60 y.o.   MRN: [id number]  This visit was conducted in person.  BP 120/82   Pulse (!) 105   Temp 98.5 F (36.9 C) (Temporal)   Ht 5' 7.75" (1.721 m)   Wt 153 lb 8 oz (69.6 kg)   SpO2 97%   BMI 23.51 kg/m    CC:  Chief Complaint  Patient presents with  . Ear Pain    Right-Seen R. Baity on 10/16/2020    Subjective:   HPI: Michael [name] is a 60 y.o. male presenting on 10/21/2020 for Ear Pain (Right-Seen R. Baity on 10/16/2020)    Right ear pain onging x 1.5 weeks:  Seen 10/16/2020 for cerumen impaction and hearing loss.. treated with  Irrigation  Sharp pain in right ear, pain in ear with swallowing... ;pain shocking him 10 times a night.Marland Kitchen keeping him up. Some pain in right throat.  Worse with lying on the right side. No sinus pain, no headache.  Chronic congestion.Marland Kitchen on flonase 2 spray per nostril daily  no fever, chronic cough, no SOB.  Current smoking.. 3/4 pack per day.  Recent MRI brain and orbits MRI.. unremarkable per pt  Reviewed recent OV note from PCP on 3/  Relevant past medical, surgical, family and social history reviewed and updated as indicated. Interim medical history since our last visit reviewed. Allergies and medications reviewed and updated. Outpatient Medications Prior to Visit  Medication Sig Dispense Refill  . Bioflavonoid Products (ESTER C PO) Take by mouth daily.    . cyanocobalamin 100 MCG tablet Take 100 mcg by mouth daily.     Marland Kitchen DEXILANT 60 MG capsule TAKE 1 CAPSULE BY MOUTH EVERY DAY 90 capsule 3  . fish oil-omega-3 fatty acids 1000 MG capsule Take 2 g by mouth daily.    . fluticasone (FLONASE) 50 MCG/ACT nasal spray SPRAY 2 SPRAYS INTO EACH NOSTRIL EVERY DAY 16 mL 5  . HORIZANT 300 MG TBCR Take 1 tablet by mouth at bedtime.    . Multiple Vitamin (MULTIVITAMIN) tablet Take 1 tablet by mouth daily.    . nortriptyline (PAMELOR) 50 MG capsule TAKE 1 CAPSULE BY MOUTH AT BEDTIME. 90 capsule  4  . oxyCODONE-acetaminophen (PERCOCET/ROXICET) 5-325 MG tablet Take 1 tablet by mouth at bedtime.    . Probiotic Product (PROBIOTIC PO) Take by mouth.    Marland Kitchen tiZANidine (ZANAFLEX) 2 MG tablet Take 2 tablets by mouth at bedtime. Takes as needed    . traZODone (DESYREL) 50 MG tablet TAKE 1 TABLET BY MOUTH EVERYDAY AT BEDTIME     No facility-administered medications prior to visit.     Per HPI unless specifically indicated in ROS section below Review of Systems  Constitutional: Negative for chills and fever.  HENT: Positive for ear pain. Negative for congestion.   Eyes: Negative for pain and redness.  Respiratory: Negative for cough and shortness of breath.   Cardiovascular: Negative for chest pain, palpitations and leg swelling.  Gastrointestinal: Negative for abdominal pain, blood in stool, constipation, diarrhea, nausea and vomiting.  Genitourinary: Negative for dysuria.  Musculoskeletal: Negative for myalgias.  Skin: Negative for rash.  Neurological: Negative for dizziness.  Psychiatric/Behavioral: The patient is not nervous/anxious.    Objective:  BP 120/82   Pulse (!) 105   Temp 98.5 F (36.9 C) (Temporal)   Ht 5' 7.75" (1.721 m)   Wt 153 lb 8 oz (69.6 kg)   SpO2 97%  BMI 23.51 kg/m   Wt Readings from Last 3 Encounters:  10/21/20 153 lb 8 oz (69.6 kg)  10/16/20 155 lb (70.3 kg)  01/01/20 151 lb (68.5 kg)      Physical Exam Constitutional:      Appearance: He is well-developed.  HENT:     Head: Normocephalic.     Right Ear: Hearing normal. Drainage, swelling and tenderness present. A middle ear effusion is present.     Left Ear: Hearing, tympanic membrane and ear canal normal.     Nose: Nose normal.  Neck:     Thyroid: No thyroid mass or thyromegaly.     Vascular: No carotid bruit.     Trachea: Trachea normal.  Cardiovascular:     Rate and Rhythm: Normal rate and regular rhythm.     Pulses: Normal pulses.     Heart sounds: Heart sounds not distant. No murmur  heard. No friction rub. No gallop.      Comments: No peripheral edema Pulmonary:     Effort: Pulmonary effort is normal. No respiratory distress.     Breath sounds: Normal breath sounds.  Skin:    General: Skin is warm and dry.     Findings: No rash.  Psychiatric:        Speech: Speech normal.        Behavior: Behavior normal.        Thought Content: Thought content normal.       Results for orders placed or performed in visit on 09/21/19  PSA  Result Value Ref Range   PSA 0.32 0.10 - 4.00 ng/mL  Comprehensive metabolic panel  Result Value Ref Range   Sodium 140 135 - 145 mEq/L   Potassium 4.2 3.5 - 5.1 mEq/L   Chloride 102 96 - 112 mEq/L   CO2 28 19 - 32 mEq/L   Glucose, Bld 93 70 - 99 mg/dL   BUN 11 6 - 23 mg/dL   Creatinine, Ser 1.01 0.40 - 1.50 mg/dL   Total Bilirubin 0.4 0.2 - 1.2 mg/dL   Alkaline Phosphatase 139 (H) 39 - 117 U/L   AST 20 0 - 37 U/L   ALT 23 0 - 53 U/L   Total Protein 7.0 6.0 - 8.3 g/dL   Albumin 4.4 3.5 - 5.2 g/dL   GFR 75.22 >60.00 mL/min   Calcium 10.0 8.4 - 10.5 mg/dL  Lipid panel  Result Value Ref Range   Cholesterol 187 0 - 200 mg/dL   Triglycerides 296.0 (H) 0.0 - 149.0 mg/dL   HDL 42.30 >39.00 mg/dL   VLDL 59.2 (H) 0.0 - 40.0 mg/dL   Total CHOL/HDL Ratio 4    NonHDL 145.04   LDL cholesterol, direct  Result Value Ref Range   Direct LDL 105.0 mg/dL    This visit occurred during the SARS-CoV-2 public health emergency.  Safety protocols were in place, including screening questions prior to the visit, additional usage of staff PPE, and extensive cleaning of exam room while observing appropriate contact time as indicated for disinfecting solutions.   COVID 19 screen:  No recent travel or known exposure to COVID19 The patient denies respiratory symptoms of COVID 19 at this time. The importance of social distancing was discussed today.   Assessment and Plan    Problem List Items Addressed This Visit    Otitis externa - Primary     New Treat with antibiotics.  If not improving as expected consider referral to ENT.  Michael Quiroa, MD   

## 2020-10-21 NOTE — Patient Instructions (Addendum)
Complete antibiotics.  Call if pain is not improving for possible referral to ENT.  Quit smoking.

## 2020-10-22 NOTE — Telephone Encounter (Signed)
E-scribed refill.  Plz schedule wellness, lab and cpe visits.  

## 2020-10-28 ENCOUNTER — Telehealth: Payer: Self-pay | Admitting: *Deleted

## 2020-10-28 DIAGNOSIS — K2100 Gastro-esophageal reflux disease with esophagitis, without bleeding: Secondary | ICD-10-CM

## 2020-10-28 NOTE — Telephone Encounter (Signed)
Received fax from pharmacy saying pt's dexilant isn't covered by insurance and request alt med be sent in.  Called pharmacy and they said that 1st they ran Rx through as a generic and since dexlansoprazole (generic) is so new they didn't cover it. Then the pharmacist ran the name brand dexilant through pt's insurance and it is covered but it's $128 for a 3 month supply. Pharmacist did say that insurance covers the generic for Nexium, Protonix, and omeprazole all at a lower tier. Pharmacist says that PCP can sent in alt med that will be cheaper or we can attempt to try to do a tier exception but since alt meds are cheaper not sure if price will be lowered

## 2020-10-30 NOTE — Telephone Encounter (Signed)
plz notify pt - insurance won't cover dexilant and is requesting switch to esomeprazole, pantoprazole, or omeprazole. Is he willing to try these?

## 2020-10-30 NOTE — Telephone Encounter (Signed)
His previous GI Dr placed him on Aciphex and omeprazole in the past. 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 Wife says they have had to get the Dr to do nonformulary exemption request to get it covered.

## 2020-10-31 ENCOUNTER — Telehealth: Payer: Self-pay | Admitting: Family Medicine

## 2020-10-31 NOTE — Telephone Encounter (Signed)
Lvm asking pt to call back

## 2020-10-31 NOTE — Telephone Encounter (Signed)
[name] called in returning the phone call from [facility]

## 2020-10-31 NOTE — Telephone Encounter (Signed)
See TE, 10/28/20.

## 2020-11-04 NOTE — Telephone Encounter (Signed)
Pt's wife, [name] (on dpr), returning call.  She says pt 's cost previously was $140 for 90-day supply.  Says she will contact the pharmacy to make sure pt's new cost is $128 for 90-day supply because that is doable.  [name] will call back if there are any issues.

## 2020-11-04 NOTE — Telephone Encounter (Signed)
Lvm asking pt to call back

## 2020-11-08 ENCOUNTER — Other Ambulatory Visit: Payer: Self-pay | Admitting: Family Medicine

## 2020-12-03 ENCOUNTER — Encounter: Payer: Self-pay | Admitting: Gastroenterology

## 2020-12-03 ENCOUNTER — Ambulatory Visit: Payer: Medicare PPO | Admitting: Family Medicine

## 2020-12-03 ENCOUNTER — Other Ambulatory Visit: Payer: Self-pay

## 2020-12-03 VITALS — BP 124/90 | HR 108 | Temp 98.6°F | Ht 67.0 in | Wt 153.2 lb

## 2020-12-03 DIAGNOSIS — H65194 Other acute nonsuppurative otitis media, recurrent, right ear: Secondary | ICD-10-CM | POA: Diagnosis not present

## 2020-12-03 MED ORDER — AMOXICILLIN-POT CLAVULANATE 875-125 MG PO TABS: 1.0000 | ORAL_TABLET | Freq: Two times a day (BID) | ORAL | 0 refills | Status: DC

## 2020-12-03 NOTE — Progress Notes (Signed)
Subjective:     Michael [name] is a 60 y.o. male presenting for Ear Pain (R x 2 days.  has had one round of antibiotics in march for same ear )     HPI   #Right ear pain - was treated for ear infection 10/21/20 - symptoms returned 2 days ago - feeling stopped up - will get a shooting pain  - some pressure - no pain on left   Review of Systems  Constitutional: Negative for chills and fever.  HENT: Positive for hearing loss. Negative for congestion, sinus pressure and sinus pain.   Respiratory: Negative for cough and shortness of breath.      Social History   Tobacco Use  Smoking Status Current Every Day Smoker  . Packs/day: 0.75  . Years: 40.00  . Pack years: 30.00  . Types: Cigarettes  Smokeless Tobacco Never Used        Objective:    BP Readings from Last 3 Encounters:  12/03/20 124/90  10/21/20 120/82  10/16/20 120/70   Wt Readings from Last 3 Encounters:  12/03/20 153 lb 4 oz (69.5 kg)  10/21/20 153 lb 8 oz (69.6 kg)  10/16/20 155 lb (70.3 kg)    BP 124/90   Pulse (!) 108   Temp 98.6 F (37 C) (Temporal)   Ht 5\' 7"  (1.702 m)   Wt 153 lb 4 oz (69.5 kg)   SpO2 96%   BMI 24.00 kg/m    Physical Exam Constitutional:      Appearance: Normal appearance. He is not ill-appearing or diaphoretic.  HENT:     Right Ear: External ear normal. A middle ear effusion is present. Tympanic membrane is erythematous and bulging.     Left Ear: Tympanic membrane and external ear normal.     Nose: Nose normal.     Mouth/Throat:     Mouth: Mucous membranes are moist.     Pharynx: No oropharyngeal exudate or posterior oropharyngeal erythema.  Eyes:     General: No scleral icterus.    Extraocular Movements: Extraocular movements intact.     Conjunctiva/sclera: Conjunctivae normal.  Cardiovascular:     Rate and Rhythm: Normal rate and regular rhythm.     Heart sounds: No murmur heard.   Pulmonary:     Effort: Pulmonary effort is normal. No respiratory  distress.     Breath sounds: Normal breath sounds. No wheezing.  Musculoskeletal:     Cervical back: Neck supple.  Skin:    General: Skin is warm and dry.  Neurological:     Mental Status: He is alert. Mental status is at baseline.  Psychiatric:        Mood and Affect: Mood normal.           Assessment & Plan:   Problem List Items Addressed This Visit   None   Visit Diagnoses    Other recurrent acute nonsuppurative otitis media of right ear    -  Primary   Relevant Medications   amoxicillin-clavulanate (AUGMENTIN) 875-125 MG tablet     Previous infection on right ear 10/2020 Will treat with Augmentin x 10 days Has annual in a few weeks discussed having pcp look at ears post treatment for resolution    Return if symptoms worsen or fail to improve.  Lesleigh Noe, MD  This visit occurred during the SARS-CoV-2 public health emergency.  Safety protocols were in place, including screening questions prior to the visit, additional usage of staff PPE,  and extensive cleaning of exam room while observing appropriate contact time as indicated for disinfecting solutions.

## 2020-12-03 NOTE — Patient Instructions (Signed)
Take antibiotics  If you get another ear infection in a short period of time we may want to have you see ENT

## 2020-12-05 DIAGNOSIS — H6591 Unspecified nonsuppurative otitis media, right ear: Secondary | ICD-10-CM | POA: Insufficient documentation

## 2020-12-05 DIAGNOSIS — H6090 Unspecified otitis externa, unspecified ear: Secondary | ICD-10-CM | POA: Insufficient documentation

## 2020-12-05 NOTE — Assessment & Plan Note (Signed)
New Treat with antibiotics.  If not improving as expected consider referral to ENT.

## 2020-12-14 ENCOUNTER — Other Ambulatory Visit: Payer: Self-pay | Admitting: Family Medicine

## 2020-12-14 DIAGNOSIS — N401 Enlarged prostate with lower urinary tract symptoms: Secondary | ICD-10-CM

## 2020-12-14 DIAGNOSIS — E785 Hyperlipidemia, unspecified: Secondary | ICD-10-CM

## 2020-12-14 DIAGNOSIS — R3912 Poor urinary stream: Secondary | ICD-10-CM

## 2020-12-17 ENCOUNTER — Ambulatory Visit (INDEPENDENT_AMBULATORY_CARE_PROVIDER_SITE_OTHER): Payer: Medicare PPO

## 2020-12-17 ENCOUNTER — Other Ambulatory Visit: Payer: Self-pay

## 2020-12-17 ENCOUNTER — Other Ambulatory Visit (INDEPENDENT_AMBULATORY_CARE_PROVIDER_SITE_OTHER): Payer: Medicare PPO

## 2020-12-17 DIAGNOSIS — Z0000 Encounter for general adult medical examination without abnormal findings: Secondary | ICD-10-CM | POA: Diagnosis not present

## 2020-12-17 DIAGNOSIS — E785 Hyperlipidemia, unspecified: Secondary | ICD-10-CM | POA: Diagnosis not present

## 2020-12-17 DIAGNOSIS — R3912 Poor urinary stream: Secondary | ICD-10-CM | POA: Diagnosis not present

## 2020-12-17 DIAGNOSIS — N401 Enlarged prostate with lower urinary tract symptoms: Secondary | ICD-10-CM | POA: Diagnosis not present

## 2020-12-17 LAB — COMPREHENSIVE METABOLIC PANEL
ALT: 19 U/L (ref 0–53)
AST: 19 U/L (ref 0–37)
Albumin: 4.3 g/dL (ref 3.5–5.2)
Alkaline Phosphatase: 105 U/L (ref 39–117)
BUN: 10 mg/dL (ref 6–23)
CO2: 28 mEq/L (ref 19–32)
Calcium: 9.6 mg/dL (ref 8.4–10.5)
Chloride: 103 mEq/L (ref 96–112)
Creatinine, Ser: 0.95 mg/dL (ref 0.40–1.50)
GFR: 86.24 mL/min (ref >60.00)
Glucose, Bld: 95 mg/dL (ref 70–99)
Potassium: 3.9 mEq/L (ref 3.5–5.1)
Sodium: 139 mEq/L (ref 135–145)
Total Bilirubin: 0.5 mg/dL (ref 0.2–1.2)
Total Protein: 6.7 g/dL (ref 6.0–8.3)

## 2020-12-17 LAB — LIPID PANEL
Cholesterol: 181 mg/dL (ref 0–200)
HDL: 40.9 mg/dL (ref >39.00)
LDL Cholesterol: 103 mg/dL — ABNORMAL HIGH (ref 0–99)
NonHDL: 139.76
Total CHOL/HDL Ratio: 4
Triglycerides: 185 mg/dL — ABNORMAL HIGH (ref 0.0–149.0)
VLDL: 37 mg/dL (ref 0.0–40.0)

## 2020-12-17 LAB — PSA: PSA: 0.41 ng/mL (ref 0.10–4.00)

## 2020-12-17 NOTE — Progress Notes (Signed)
Subjective:   Michael [name] is a 60 y.o. male who presents for Medicare Annual/Subsequent preventive examination.  Review of Systems: N/A      I connected with the patient today by telephone and verified that I am speaking with the correct person using two identifiers. Location patient: home Location nurse: work Persons participating in the telephone visit: patient, nurse.   I discussed the limitations, risks, security and privacy concerns of performing an evaluation and management service by telephone and the availability of in person appointments. I also discussed with the patient that there may be a patient responsible charge related to this service. The patient expressed understanding and verbally consented to this telephonic visit.        Cardiac Risk Factors include: advanced age (>75men, >37 women);dyslipidemia;male gender;smoking/ tobacco exposure     Objective:    Today's Vitals   12/17/20 1159  PainSc: 8    There is no height or weight on file to calculate BMI.  Advanced Directives 12/17/2020 01/30/2019 11/16/2013  Does Patient Have a Medical Advance Directive? No No Patient does not have advance directive  Would patient like information on creating a medical advance directive? No - Patient declined No - Patient declined -    Current Medications (verified) Outpatient Encounter Medications as of 12/17/2020  Medication Sig  . Bioflavonoid Products (ESTER C PO) Take by mouth daily.  . cyanocobalamin 100 MCG tablet Take 100 mcg by mouth daily.   Marland Kitchen DEXILANT 60 MG capsule TAKE 1 CAPSULE BY MOUTH EVERY DAY  . fish oil-omega-3 fatty acids 1000 MG capsule Take 2 g by mouth daily.  . fluticasone (FLONASE) 50 MCG/ACT nasal spray SPRAY 2 SPRAYS INTO EACH NOSTRIL EVERY DAY  . HORIZANT 300 MG TBCR Take 1 tablet by mouth at bedtime.  . Multiple Vitamin (MULTIVITAMIN) tablet Take 1 tablet by mouth daily.  . nortriptyline (PAMELOR) 50 MG capsule TAKE 1 CAPSULE BY MOUTH AT BEDTIME.   Marland Kitchen oxyCODONE-acetaminophen (PERCOCET/ROXICET) 5-325 MG tablet Take 1 tablet by mouth at bedtime.  . Probiotic Product (PROBIOTIC PO) Take by mouth.  Marland Kitchen tiZANidine (ZANAFLEX) 2 MG tablet Take 2 tablets by mouth at bedtime. Takes as needed  . traZODone (DESYREL) 50 MG tablet TAKE 1 TABLET BY MOUTH EVERYDAY AT BEDTIME  . amoxicillin-clavulanate (AUGMENTIN) 875-125 MG tablet Take 1 tablet by mouth 2 (two) times daily. (Patient not taking: Reported on 12/17/2020)   No facility-administered encounter medications on file as of 12/17/2020.    Allergies (verified) Aspirin and Codeine sulfate   History: Past Medical History:  Diagnosis Date  . Adenomatous colon polyp 02/2008  . AK (actinic keratosis)   . Allergy   . Basal cell carcinoma of face    Mohs procedure  . Dyslipidemia 02/29/2016  . Esophagitis 02/13/01  . Gastritis 02/13/01  . GERD (gastroesophageal reflux disease)   . Hemorrhoids   . Hiatal hernia   . HNP (herniated nucleus pulposus), cervical 09/30/2016   With radiculopathy, s/p anterior discectomy Vertell Limber) 12/2016  . Reactive depression (situational)   . Renal stones   . Smoker    Past Surgical History:  Procedure Laterality Date  . ANTERIOR CERVICAL DISCECTOMY  12/2016   C5/6 (Dr Vertell Limber)  . COLONOSCOPY  02/14/03   polyps, ext hemorrhoids  . COLONOSCOPY  2009   polyps, rec rpt 5 yrs  . COLONOSCOPY  2015   5 polyps, rec rpt 5 yrs [facility])  . COLONOSCOPY  01/2019   multiple polyps including TAs, rpt 2-3 yrs (  [facility])  . ESOPHAGOGASTRODUODENOSCOPY  02/14/03   esophagitis, gastritis, duodenitis without hemorrhage  . ESOPHAGOGASTRODUODENOSCOPY  01/2019   chronic gastritis, no H pylori [facility])  . MOHS SURGERY    . POLYPECTOMY    . TONSILLECTOMY     Family History  Problem Relation Age of Onset  . Hypertension Father   . Gout Father   . GER disease Father   . Colon polyps Father   . Breast cancer Mother   . Colon polyps Brother   . Glaucoma Brother        early onset  . Heart  attack Paternal Grandfather   . Transient ischemic attack Maternal Grandfather   . Diabetes Neg Hx   . Colon cancer Neg Hx   . Esophageal cancer Neg Hx   . Rectal cancer Neg Hx   . Stomach cancer Neg Hx    Social History   Socioeconomic History  . Marital status: Married    Spouse name: Not on file  . Number of children: 0  . Years of education: Not on file  . Highest education level: Not on file  Occupational History  . 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 Smoking status: Current Every Day Smoker    Packs/day: 0.75    Years: 40.00    Pack years: 30.00    Types: Cigarettes  . Smokeless tobacco: Never Used  Vaping Use  . Vaping Use: Never used  Substance and Sexual Activity  . Alcohol use: No  . Drug use: No  . Sexual activity: Not on file  Other Topics Concern  . Not on file  Social History Narrative   Caffeine: 1 cup coffee/yda, Dr Malachi Bonds throughout the day   Lives with wife   Occupation: HVAC   Activity: work, Retail buyer, mows yard   Diet: good water, fruits/vegetables daily   Social Determinants of Radio broadcast assistant Strain: [place]   . Difficulty of Paying Living Expenses: Not hard at all  Food Insecurity: No Food Insecurity  . Worried About Charity fundraiser in the Last Year: Never true  . Ran Out of Food in the Last Year: Never true  Transportation Needs: No Transportation Needs  . Lack of Transportation (Medical): No  . Lack of Transportation (Non-Medical): No  Physical Activity: Inactive  . Days of Exercise per Week: 0 days  . Minutes of Exercise per Session: 0 min  Stress: No Stress Concern Present  . Feeling of Stress : Not at all  Social Connections: Not on file    Tobacco Counseling Ready to quit: Not Answered Counseling given: Not Answered   Clinical Intake:  Pre-visit preparation completed: Yes  Pain : 0-10 Pain Score: 8  Pain Type: Chronic pain Pain Location: Neck Pain Descriptors / Indicators: Aching Pain  Onset: More than a month ago Pain Frequency: Intermittent     Nutritional Risks: None Diabetes: No  How often do you need to have someone help you when you read instructions, pamphlets, or other written materials from your doctor or pharmacy?: 1 - Never  Diabetic: No Nutrition Risk Assessment:  Has the patient had any N/V/D within the last 2 months?  No  Does the patient have any non-healing wounds?  No  Has the patient had any unintentional weight loss or weight gain?  No   Diabetes:  Is the patient diabetic?  No  If diabetic, was a CBG obtained today?  N/A Did the patient bring in their glucometer from home?  N/A How often do you monitor your CBG's? N/A.   Financial Strains and Diabetes Management:  Are you having any financial strains with the device, your supplies or your medication? N/A.  Does the patient want to be seen by Chronic Care Management for management of their diabetes?  N/A Would the patient like to be referred to a Nutritionist or for Diabetic Management?  N/A Interpreter Needed?: No  Information entered by :: CJohnson, LPN   Activities of Daily Living In your present state of health, do you have any difficulty performing the following activities: 12/17/2020  Hearing? N  Vision? N  Difficulty concentrating or making decisions? N  Walking or climbing stairs? N  Dressing or bathing? N  Doing errands, shopping? N  Preparing Food and eating ? N  Using the Toilet? N  In the past six months, have you accidently leaked urine? N  Do you have problems with loss of bowel control? N  Managing your Medications? N  Managing your Finances? N  Housekeeping or managing your Housekeeping? N  Some recent data might be hidden    Patient Care Team: Ria Bush, MD as PCP - General (Family Medicine)  Indicate any recent Medical Services you may have received from other than [facility] providers in the past year (date may be approximate).     Assessment:   This  is a routine wellness examination for Miquel.  Hearing/Vision screen  Hearing Screening   125Hz  250Hz  500Hz  1000Hz  2000Hz  3000Hz  4000Hz  6000Hz  8000Hz   Right ear:           Left ear:           Vision Screening Comments: Patient gets annual eye exams   Dietary issues and exercise activities discussed: Current Exercise Habits: The patient does not participate in regular exercise at present, Exercise limited by: None identified  Goals Addressed            This Visit's Progress   . Patient Stated       12/17/2020, I will maintain and continue medications as prescribed.       Depression Screen PHQ 2/9 Scores 12/17/2020 01/09/2019 07/11/2018  PHQ - 2 Score 0 1 1  PHQ- 9 Score 0 3 2    Fall Risk Fall Risk  12/17/2020  Falls in the past year? 0  Number falls in past yr: 0  Injury with Fall? 0  Risk for fall due to : Medication side effect  Follow up Falls evaluation completed;Falls prevention discussed    FALL RISK PREVENTION PERTAINING TO THE HOME:  Any stairs in or around the home? Yes  If so, are there any without handrails? No  Home free of loose throw rugs in walkways, pet beds, electrical cords, etc? Yes  Adequate lighting in your home to reduce risk of falls? Yes   ASSISTIVE DEVICES UTILIZED TO PREVENT FALLS:  Life alert? No  Use of a cane, walker or w/c? No  Grab bars in the bathroom? No  Shower chair or bench in shower? No  Elevated toilet seat or a handicapped toilet? No   TIMED UP AND GO:  Was the test performed? N/A telephone visit .   Cognitive Function: MMSE - Mini Mental State Exam 12/17/2020  Not completed: Refused       Mini Cog  Mini-Cog screen was not completed. Patient refused. Maximum score is 22. A value of 0 denotes this part of the MMSE was not completed or the patient failed this part of the  Mini-Cog screening.  Immunizations Immunization History  Administered Date(s) Administered  . Influenza,inj,Quad PF,6+ Mos 07/11/2018  .  Influenza-Unspecified 05/09/2014, 05/26/2016, 05/16/2017  . PFIZER(Purple Top)SARS-COV-2 Vaccination 11/02/2019, 11/27/2019  . Td 08/10/2002  . Tdap 07/25/2014    TDAP status: Up to date  Flu Vaccine status: due Fall 2022  Pneumococcal vaccine status: N/A, due at age 29  Covid-19 vaccine status: 2 doses completed, booster declined   Qualifies for Shingles Vaccine? Yes   Zostavax completed No   Shingrix Completed?: No.    Education has been provided regarding the importance of this vaccine. Patient has been advised to call insurance company to determine out of pocket expense if they have not yet received this vaccine. Advised may also receive vaccine at local pharmacy or Health Dept. Verbalized acceptance and understanding.  Screening Tests Health Maintenance  Topic Date Due  . HIV Screening  Never done  . COVID-19 Vaccine (3 - Booster for Pfizer series) 05/28/2020  . COLONOSCOPY (Pts 45-26yrs Insurance coverage will need to be confirmed)  01/29/2021  . INFLUENZA VACCINE  03/09/2021  . TETANUS/TDAP  07/25/2024  . Hepatitis C Screening  Completed  . HPV VACCINES  Aged Out    Health Maintenance  Health Maintenance Due  Topic Date Due  . HIV Screening  Never done  . COVID-19 Vaccine (3 - Booster for Pfizer series) 05/28/2020    Colorectal cancer screening: Type of screening: Colonoscopy. Completed 01/30/2019. Repeat every 2 years  Lung Cancer Screening: (Low Dose CT Chest recommended if Age 30-80 years, 30 pack-year currently smoking OR have quit w/in 15 years.) does qualify.   Lung Cancer Screening Referral: completed 01/01/2020  Additional Screening:  Hepatitis C Screening: does qualify; Completed 02/27/2016  Vision Screening: Recommended annual ophthalmology exams for early detection of glaucoma and other disorders of the eye. Is the patient up to date with their annual eye exam?  Yes  Who is the provider or what is the name of the office in which the patient attends  annual eye exams? Dr. Katy Fitch If pt is not established with a provider, would they like to be referred to a provider to establish care? No .   Dental Screening: Recommended annual dental exams for proper oral hygiene  Community Resource Referral / Chronic Care Management: CRR required this visit?  No   CCM required this visit?  No      Plan:     I have personally reviewed and noted the following in the patient's chart:   . Medical and social history . Use of alcohol, tobacco or illicit drugs  . Current medications and supplements including opioid prescriptions. Patient is currently taking opioid prescriptions. Information provided to patient regarding non-opioid alternatives. Patient advised to discuss non-opioid treatment plan with their provider. . Functional ability and status . Nutritional status . Physical activity . Advanced directives . List of other physicians . Hospitalizations, surgeries, and ER visits in previous 12 months . Vitals . Screenings to include cognitive, depression, and falls . Referrals and appointments  In addition, I have reviewed and discussed with patient certain preventive protocols, quality metrics, and best practice recommendations. A written personalized care plan for preventive services as well as general preventive health recommendations were provided to patient.   Due to this being a telephonic visit, the after visit summary with patients personalized plan was offered to patient via office or my-chart. Patient preferred to pick up at office at next visit or via mychart.   Andrez Grime, LPN  12/17/2020      

## 2020-12-17 NOTE — Patient Instructions (Addendum)
Michael [name] , Thank you for taking time to come for your Medicare Wellness Visit. I appreciate your ongoing commitment to your health goals. Please review the following plan we discussed and let me know if I can assist you in the future.   Screening recommendations/referrals: Colonoscopy: Up to date, completed 01/30/2019, due 01/2021 Recommended yearly ophthalmology/optometry visit for glaucoma screening and checkup Recommended yearly dental visit for hygiene and checkup  Vaccinations: Influenza vaccine: due Fall 2022  Pneumococcal vaccine: due at age 62  Tdap vaccine: Up to date, completed 07/25/2014, due 07/2024 Shingles vaccine: due, check with your insurance regarding coverage if interested    Covid-19: 2 doses completed, booster declined  Advanced directives: Advance directive discussed with you today. Even though you declined this today please call our office should you change your mind and we can give you the proper paperwork for you to fill out.  Conditions/risks identified: dyslipidemia   Next appointment: Follow up in one year for your annual wellness visit.   Preventive Care 62-64 Years and Older, Male Preventive care refers to lifestyle choices and visits with your health care provider that can promote health and wellness. What does preventive care include?  A yearly physical exam. This is also called an annual well check.  Dental exams once or twice a year.  Routine eye exams. Ask your health care provider how often you should have your eyes checked.  Personal lifestyle choices, including:  Daily care of your teeth and gums.  Regular physical activity.  Eating a healthy diet.  Avoiding tobacco and drug use.  Limiting alcohol use.  Practicing safe sex.  Taking low doses of aspirin every day.  Taking vitamin and mineral supplements as recommended by your health care provider. What happens during an annual well check? The services and screenings done by your  health care provider during your annual well check will depend on your age, overall health, lifestyle risk factors, and family history of disease. Counseling  Your health care provider may ask you questions about your:  Alcohol use.  Tobacco use.  Drug use.  Emotional well-being.  Home and relationship well-being.  Sexual activity.  Eating habits.  History of falls.  Memory and ability to understand (cognition).  Work and work Statistician. Screening  You may have the following tests or measurements:  Height, weight, and BMI.  Blood pressure.  Lipid and cholesterol levels. These may be checked every 5 years, or more frequently if you are over 12 years old.  Skin check.  Lung cancer screening. You may have this screening every year starting at age 21 if you have a 30-pack-year history of smoking and currently smoke or have quit within the past 15 years.  Fecal occult blood test (FOBT) of the stool. You may have this test every year starting at age 42.  Flexible sigmoidoscopy or colonoscopy. You may have a sigmoidoscopy every 5 years or a colonoscopy every 10 years starting at age 35.  Prostate cancer screening. Recommendations will vary depending on your family history and other risks.  Hepatitis C blood test.  Hepatitis B blood test.  Sexually transmitted disease (STD) testing.  Diabetes screening. This is done by checking your blood sugar (glucose) after you have not eaten for a while (fasting). You may have this done every 1-3 years.  Abdominal aortic aneurysm (AAA) screening. You may need this if you are a current or former smoker.  Osteoporosis. You may be screened starting at age 30 if you are at  high risk. Talk with your health care provider about your test results, treatment options, and if necessary, the need for more tests. Vaccines  Your health care provider may recommend certain vaccines, such as:  Influenza vaccine. This is recommended every  year.  Tetanus, diphtheria, and acellular pertussis (Tdap, Td) vaccine. You may need a Td booster every 10 years.  Zoster vaccine. You may need this after age 60.  Pneumococcal 13-valent conjugate (PCV13) vaccine. One dose is recommended after age 31.  Pneumococcal polysaccharide (PPSV23) vaccine. One dose is recommended after age 67. Talk to your health care provider about which screenings and vaccines you need and how often you need them. This information is not intended to replace advice given to you by your health care provider. Make sure you discuss any questions you have with your health care provider. Document Released: 08/22/2015 Document Revised: 04/14/2016 Document Reviewed: 05/27/2015 Elsevier Interactive Patient Education  2017 [place] Prevention in the Home Falls can cause injuries. They can happen to people of all ages. There are many things you can do to make your home safe and to help prevent falls. What can I do on the outside of my home?  Regularly fix the edges of walkways and driveways and fix any cracks.  Remove anything that might make you trip as you walk through a door, such as a raised step or threshold.  Trim any bushes or trees on the path to your home.  Use bright outdoor lighting.  Clear any walking paths of anything that might make someone trip, such as rocks or tools.  Regularly check to see if handrails are loose or broken. Make sure that both sides of any steps have handrails.  Any raised decks and porches should have guardrails on the edges.  Have any leaves, snow, or ice cleared regularly.  Use sand or salt on walking paths during winter.  Clean up any spills in your garage right away. This includes oil or grease spills. What can I do in the bathroom?  Use night lights.  Install grab bars by the toilet and in the tub and shower. Do not use towel bars as grab bars.  Use non-skid mats or decals in the tub or shower.  If you  need to sit down in the shower, use a plastic, non-slip stool.  Keep the floor dry. Clean up any water that spills on the floor as soon as it happens.  Remove soap buildup in the tub or shower regularly.  Attach bath mats securely with double-sided non-slip rug tape.  Do not have throw rugs and other things on the floor that can make you trip. What can I do in the bedroom?  Use night lights.  Make sure that you have a light by your bed that is easy to reach.  Do not use any sheets or blankets that are too big for your bed. They should not hang down onto the floor.  Have a firm chair that has side arms. You can use this for support while you get dressed.  Do not have throw rugs and other things on the floor that can make you trip. What can I do in the kitchen?  Clean up any spills right away.  Avoid walking on wet floors.  Keep items that you use a lot in easy-to-reach places.  If you need to reach something above you, use a strong step stool that has a grab bar.  Keep electrical cords out of the  way.  Do not use floor polish or wax that makes floors slippery. If you must use wax, use non-skid floor wax.  Do not have throw rugs and other things on the floor that can make you trip. What can I do with my stairs?  Do not leave any items on the stairs.  Make sure that there are handrails on both sides of the stairs and use them. Fix handrails that are broken or loose. Make sure that handrails are as long as the stairways.  Check any carpeting to make sure that it is firmly attached to the stairs. Fix any carpet that is loose or worn.  Avoid having throw rugs at the top or bottom of the stairs. If you do have throw rugs, attach them to the floor with carpet tape.  Make sure that you have a light switch at the top of the stairs and the bottom of the stairs. If you do not have them, ask someone to add them for you. What else can I do to help prevent falls?  Wear shoes  that:  Do not have high heels.  Have rubber bottoms.  Are comfortable and fit you well.  Are closed at the toe. Do not wear sandals.  If you use a stepladder:  Make sure that it is fully opened. Do not climb a closed stepladder.  Make sure that both sides of the stepladder are locked into place.  Ask someone to hold it for you, if possible.  Clearly mark and make sure that you can see:  Any grab bars or handrails.  First and last steps.  Where the edge of each step is.  Use tools that help you move around (mobility aids) if they are needed. These include:  Canes.  Walkers.  Scooters.  Crutches.  Turn on the lights when you go into a dark area. Replace any light bulbs as soon as they burn out.  Set up your furniture so you have a clear path. Avoid moving your furniture around.  If any of your floors are uneven, fix them.  If there are any pets around you, be aware of where they are.  Review your medicines with your doctor. Some medicines can make you feel dizzy. This can increase your chance of falling. Ask your doctor what other things that you can do to help prevent falls. This information is not intended to replace advice given to you by your health care provider. Make sure you discuss any questions you have with your health care provider. Document Released: 05/22/2009 Document Revised: 01/01/2016 Document Reviewed: 08/30/2014 Elsevier Interactive Patient Education  2017 [place].   Opioid Pain Medicine Management Opioids are powerful medicines that are used to treat moderate to severe pain. When used for short periods of time, they can help you:  Sleep better.  Do better in physical or occupational therapy.  Feel better in the first few days after an injury.  Recover from surgery. Opioids should be taken with the supervision of a trained health care provider. They should be taken for the shortest period of time possible. This is because opioids can  be addictive, and the longer you take opioids, the greater your risk of addiction (opioid use disorder). What are the risks? Using opioid pain medicines for longer than 3 days increases your risk of these side effects. Opioids can cause side effects, such as:  Constipation.  Nausea.  Vomiting.  Drowsiness.  Confusion.  Opioid use disorder.  Breathing difficulties (respiratory  depression). Taking opioid pain medicine for a long period of time can affect your ability to do daily tasks. It also puts you at risk for:  Motor vehicle accidents.  Depression.  Suicide.  Heart attack.  Overdose, which can sometimes lead to death. What is a pain treatment plan? A pain treatment plan is an agreement between you and your health care provider. Pain is unique to each person, and treatments vary depending on your condition. To manage your pain successfully, you and your health care provider need to understand each other and work together. To help you do this:  Discuss the goals of your treatment, including how much pain you might expect to have and how you will manage the pain.  Review the risks and benefits of taking opioid medicines for your condition.  Remember that a good treatment plan uses more than one approach and minimizes the chance of side effects.  Be honest about the amount of medicines you take and about any drug or alcohol use.  Get pain medicine prescriptions from only one health care provider. Pain can be managed with many types of alternative treatments. Ask your health care provider to refer you to one or more specialists who can help you manage pain through:  Physical or occupational therapy.  Counseling (cognitive behavioral therapy).  Good nutrition.  Biofeedback.  Massage.  Meditation.  Non-opioid medicine.  Following a gentle exercise program. Tapering your use of opioids If you have been taking opioid medicine for more than a few weeks, you may need  to slowly decrease (taper) how much you take until you stop completely. Tapering your use of opioids can decrease your chances of experiencing withdrawal symptoms, such as:  Pain and cramping in the abdomen.  Nausea.  Sweating.  Sleepiness.  Restlessness.  Uncontrollable shaking (tremors).  Cravings for the medicine. Do not attempt to taper your use of opioids on your own. Talk with your health care provider about how to do this. Your health care provider may prescribe a step-down schedule based on how much medicine you are taking and how long you have been taking it. Follow these instructions at home: Safety and storage  While you are taking opioid pain medicine: ? Do not drive. ? Do not use machinery or power tools. ? Do not sign legal documents. ? Do not drink alcohol. ? Do not take sleeping pills. ? Do not supervise children by yourself. ? Do not participate in activities that require climbing or being in high places. ? Do not enter a body of water, such as a lake, river, ocean, spa, or swimming pool.  Keep pain medicine in a locked cabinet or in a secure area where children cannot reach it.  Do not share your pain medicine with anyone.   Getting rid of leftover pills  Do not save any leftover pills. Get rid of leftover pills safely by: ? Taking the medicine to a prescription take-back program. This is usually offered by the county or law enforcement. ? Bringing them to a pharmacy that has a drug disposal container. ? Flushing them down the toilet. Check the label or package insert of your medicine to see whether this is safe to do. ? Throwing them out in the trash. Check the label or package insert of your medicine to see whether this is safe to do. If it is safe to throw it out, remove the medicine from the original container, put it into a sealable bag or container, and mix it  with used coffee grounds, food scraps, dirt, or cat litter before putting it in the  trash. Activity  Return to your normal activities as told by your health care provider. Ask your health care provider what activities are safe for you.  Avoid activities that make your pain worse.  Do exercises as told by your health care provider. General instructions  You may need to take these actions to prevent or treat constipation: ? Drink enough fluid to keep your urine pale yellow. ? Take over-the-counter or prescription medicines. ? Eat foods that are high in fiber, such as beans, whole grains, and fresh fruits and vegetables. ? Limit foods that are high in fat and processed sugars, such as fried or sweet foods.  Keep all follow-up visits as told by your health care provider. This is important. Where to find support If you have been taking opioids for a long time, you may benefit from receiving support for quitting from a local support group or counselor. Ask your health care provider for a referral to these resources in your area. Where to find more information  Centers for Disease Control and Prevention (CDC): http://www.wolf.info/  [place] Food and Drug Administration (FDA): [place] Get help right away if: Seek medical care right away if you are taking opioids and you, or people close to you, notice any of the following:  Difficulty breathing.  Breathing that is slower or more shallow than normal.  A very slow heartbeat (pulse).  Severe confusion.  Unconsciousness.  Sleepiness.  Slurred speech.  Nausea and vomiting.  Cold, clammy skin.  Blue lips or fingernails.  Limpness.  Abnormally small pupils. If you think that you or someone else may have taken too much of an opioid medicine, get medical help right away. Call your local emergency services (911 in the [place] Do not drive yourself to the hospital. If you ever feel like you may hurt yourself or others, or have thoughts about taking your own life, get help right away. You can go to your nearest emergency  department or call:  Your local emergency services (911 in the [place]  The hotline of the [facility] 901-616-3722 in the [place]  A suicide crisis helpline, such as the [place] at (704) 391-6988. This is open 24 hours a day. Summary  Opioid medicines can help you manage moderate to severe pain for a short period of time.  A pain treatment plan is an agreement between you and your health care provider. Discuss the goals of your treatment, including how much pain you might expect to have and how you will manage the pain.  Pain can be managed with many types of alternative treatments.  If you think that you or someone else may have taken too much of an opioid, get medical help right away. This information is not intended to replace advice given to you by your health care provider. Make sure you discuss any questions you have with your health care provider. Document Revised: 06/01/2019 Document Reviewed: 08/25/2018 Elsevier Patient Education  2021 [employer].

## 2020-12-17 NOTE — Progress Notes (Signed)
PCP notes:  Health Maintenance: Covid booster- declined   Abnormal Screenings: none   Patient concerns: Recheck recent ear infection   Nurse concerns: none   Next PCP appt.: 12/24/2020 @ 10:30 am

## 2020-12-23 ENCOUNTER — Encounter: Payer: Self-pay | Admitting: Gastroenterology

## 2020-12-24 ENCOUNTER — Encounter: Payer: Self-pay | Admitting: Family Medicine

## 2020-12-24 ENCOUNTER — Ambulatory Visit (INDEPENDENT_AMBULATORY_CARE_PROVIDER_SITE_OTHER): Payer: Medicare PPO | Admitting: Family Medicine

## 2020-12-24 ENCOUNTER — Other Ambulatory Visit: Payer: Self-pay | Admitting: *Deleted

## 2020-12-24 ENCOUNTER — Other Ambulatory Visit: Payer: Self-pay

## 2020-12-24 VITALS — BP 120/76 | HR 104 | Temp 98.4°F | Ht 68.0 in | Wt 148.5 lb

## 2020-12-24 DIAGNOSIS — Z87891 Personal history of nicotine dependence: Secondary | ICD-10-CM

## 2020-12-24 DIAGNOSIS — R918 Other nonspecific abnormal finding of lung field: Secondary | ICD-10-CM

## 2020-12-24 DIAGNOSIS — E785 Hyperlipidemia, unspecified: Secondary | ICD-10-CM

## 2020-12-24 DIAGNOSIS — Z0000 Encounter for general adult medical examination without abnormal findings: Secondary | ICD-10-CM | POA: Diagnosis not present

## 2020-12-24 DIAGNOSIS — F17200 Nicotine dependence, unspecified, uncomplicated: Secondary | ICD-10-CM | POA: Diagnosis not present

## 2020-12-24 DIAGNOSIS — J439 Emphysema, unspecified: Secondary | ICD-10-CM

## 2020-12-24 DIAGNOSIS — K2100 Gastro-esophageal reflux disease with esophagitis, without bleeding: Secondary | ICD-10-CM | POA: Diagnosis not present

## 2020-12-24 DIAGNOSIS — N401 Enlarged prostate with lower urinary tract symptoms: Secondary | ICD-10-CM

## 2020-12-24 DIAGNOSIS — Z122 Encounter for screening for malignant neoplasm of respiratory organs: Secondary | ICD-10-CM

## 2020-12-24 DIAGNOSIS — R3912 Poor urinary stream: Secondary | ICD-10-CM

## 2020-12-24 DIAGNOSIS — Z7189 Other specified counseling: Secondary | ICD-10-CM | POA: Diagnosis not present

## 2020-12-24 DIAGNOSIS — H6591 Unspecified nonsuppurative otitis media, right ear: Secondary | ICD-10-CM

## 2020-12-24 MED ORDER — AMOXICILLIN-POT CLAVULANATE 875-125 MG PO TABS
1.0000 | ORAL_TABLET | Freq: Two times a day (BID) | ORAL | 0 refills | Status: AC
Start: 2020-12-24 — End: 2021-01-03

## 2020-12-24 MED ORDER — NORTRIPTYLINE HCL 50 MG PO CAPS: 50.0000 mg | ORAL_CAPSULE | Freq: Every day | ORAL | 4 refills | Status: DC

## 2020-12-24 MED ORDER — DEXLANSOPRAZOLE 60 MG PO CPDR: 60.0000 mg | DELAYED_RELEASE_CAPSULE | Freq: Every day | ORAL | 3 refills | Status: DC

## 2020-12-24 NOTE — Assessment & Plan Note (Signed)
Overdue for f/u CT - see above.

## 2020-12-24 NOTE — Progress Notes (Signed)
Patient ID: Michael [name], male    DOB: 20-Apr-1961, 60 y.o.   MRN: [id number]  This visit was conducted in person.  BP 120/76   Pulse (!) 104   Temp 98.4 F (36.9 C) (Temporal)   Ht 5\' 8"  (1.727 m)   Wt 148 lb 8 oz (67.4 kg)   SpO2 97%   BMI 22.58 kg/m    CC: CPE Subjective:   HPI: Michael [name] is a 60 y.o. male presenting on 12/24/2020 for Annual Exam (Prt 2. )   Saw health advisor last week for medicare wellness visit. Note reviewed. That would have been his initial medicare wellness visit as he received medicare 12/08/2019.   No exam data present  Flowsheet Row Clinical Support from 12/17/2020 in [place] at [place]  PHQ-2 Total Score 0      Fall Risk  12/17/2020  Falls in the past year? 0  Number falls in past yr: 0  Injury with Fall? 0  Risk for fall due to : Medication side effect  Follow up Falls evaluation completed;Falls prevention discussed     Seen x3 at our office over the past 2 months with cerumen impaction then R otitis externa treated with oral amoxicillin, then R otitis media treated with augmentin 10d course. Would like ears checked again. Persistent occasional muffled hearing to right ear. This is despite regular flonase use.   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 Takes oxycodone and nortriptyline at bedtime. Received SS disability - started 12/08/2019.   Preventative: COLONOSCOPY - 01/2019 - multiple polyps including TAs, rpt 2 yrs [facility]) - already has appt  Prostate cancer screening - reassuring PSA. No BPH symptoms.  Lung cancer screening - undergoing, started 12/2019 - found multiple solid R lung nodules largest 7.63mm, rec rpt CT in 6 months - not completed. Will send note to lung cancer screening team to get this scheduled. Flu shotyearly COVID vaccine Pfizer 10/2019, 11/2019 Tdap 2015 Shingrix - discussed, to check with pharmacy  Advanced directive: discussed. Doesn't have  advanced directive. Would want wife [name] to be HCPOA. Advanced directive provided today.  Seat belt use discussed Sunscreen use discussed. No changing moles on skin. 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 Interim medical history since our last visit reviewed. Allergies and medications reviewed and updated. Outpatient Medications Prior to Visit  Medication Sig Dispense Refill  . Bioflavonoid Products (ESTER C PO) Take by mouth daily.    . cyanocobalamin 100 MCG tablet Take 100 mcg by mouth daily.     . fish oil-omega-3 fatty acids 1000 MG capsule Take 2 g by mouth daily.    . fluticasone (FLONASE) 50 MCG/ACT nasal spray SPRAY 2 SPRAYS INTO EACH NOSTRIL EVERY DAY 48 mL 0  . HORIZANT 300 MG TBCR Take 1 tablet by mouth at bedtime.    . Multiple Vitamin (MULTIVITAMIN) tablet Take 1 tablet by mouth daily.    Marland Kitchen oxyCODONE-acetaminophen (PERCOCET/ROXICET) 5-325 MG tablet Take 1 tablet by mouth at bedtime.    Marland Kitchen  Probiotic Product (PROBIOTIC PO) Take by mouth.    Marland Kitchen tiZANidine (ZANAFLEX) 2 MG tablet Take 2 tablets by mouth at bedtime. Takes as needed    . DEXILANT 60 MG capsule TAKE 1 CAPSULE BY MOUTH EVERY DAY 90 capsule 0  . nortriptyline (PAMELOR) 50 MG capsule TAKE 1 CAPSULE BY MOUTH AT BEDTIME. 90 capsule 4  . traZODone (DESYREL) 50 MG tablet TAKE 1 TABLET BY MOUTH EVERYDAY AT BEDTIME    . amoxicillin-clavulanate (AUGMENTIN) 875-125 MG tablet Take 1 tablet by mouth 2 (two) times daily. (Patient not taking: Reported on 12/17/2020) 20 tablet 0   No facility-administered medications prior to visit.      Per HPI unless specifically indicated in ROS section below Review of Systems  Constitutional: Negative for activity change, appetite change, chills, fatigue, fever and unexpected weight change.  HENT: Negative for hearing loss.   Eyes: Negative for visual disturbance.  Respiratory: Negative for cough, chest tightness, shortness of breath and wheezing.   Cardiovascular: Negative for chest pain, palpitations and leg swelling.  Gastrointestinal: Negative for abdominal distention, abdominal pain, blood in stool, constipation, diarrhea, nausea and vomiting.  Genitourinary: Negative for difficulty urinating and hematuria.  Musculoskeletal: Negative for arthralgias, myalgias and neck pain.  Skin: Negative for rash.  Neurological: Negative for dizziness, seizures, syncope and headaches.  Hematological: Negative for adenopathy. Bruises/bleeds easily.  Psychiatric/Behavioral: Negative for dysphoric mood. The patient is not nervous/anxious.    Objective:  BP 120/76   Pulse (!) 104   Temp 98.4 F (36.9 C) (Temporal)   Ht 5\' 8"  (1.727 m)   Wt 148 lb 8 oz (67.4 kg)   SpO2 97%   BMI 22.58 kg/m   Wt Readings from Last 3 Encounters:  12/24/20 148 lb 8 oz (67.4 kg)  12/03/20 153 lb 4 oz (69.5 kg)  10/21/20 153 lb 8 oz (69.6 kg)      Physical Exam Vitals and nursing note reviewed.  Constitutional:      General: He is not in acute distress.    Appearance: Normal appearance. He is well-developed.  HENT:     Head: Normocephalic and atraumatic.     Right Ear: Hearing, ear canal and external ear normal. Drainage present. A middle ear effusion is present. No mastoid tenderness. Tympanic membrane is erythematous. Tympanic membrane is not perforated.     Left Ear: Hearing, tympanic membrane, ear canal and external ear normal.     Ears:     Comments: Cerumen removed from R canal with plastic curette Eyes:     General: No scleral icterus.    Extraocular Movements: Extraocular movements intact.      Conjunctiva/sclera: Conjunctivae normal.     Pupils: Pupils are equal, round, and reactive to light.  Neck:     Thyroid: No thyroid mass or thyromegaly.     Vascular: No carotid bruit.  Cardiovascular:     Rate and Rhythm: Normal rate and regular rhythm.     Pulses: Normal pulses.          Radial pulses are 2+ on the right side and 2+ on the left side.     Heart sounds: Normal heart sounds. No murmur heard.   Pulmonary:     Effort: Pulmonary effort is normal. No respiratory distress.     Breath sounds: Normal breath sounds. No wheezing, rhonchi or rales.  Abdominal:     General: Bowel sounds are normal. There is no distension.     Palpations: Abdomen is soft. There is  no mass.     Tenderness: There is no abdominal tenderness. There is no guarding or rebound.     Hernia: No hernia is present.  Musculoskeletal:        General: Normal range of motion.     Cervical back: Normal range of motion and neck supple.     Right lower leg: No edema.     Left lower leg: No edema.  Lymphadenopathy:     Cervical: No cervical adenopathy.  Skin:    General: Skin is warm and dry.     Findings: No rash.  Neurological:     General: No focal deficit present.     Mental Status: He is alert and oriented to person, place, and time.     Comments: CN grossly intact, station and gait intact  Psychiatric:        Mood and Affect: Mood normal.        Behavior: Behavior normal.        Thought Content: Thought content normal.        Judgment: Judgment normal.       Results for orders placed or performed in visit on 12/17/20  PSA  Result Value Ref Range   PSA 0.41 0.10 - 4.00 ng/mL  Comprehensive metabolic panel  Result Value Ref Range   Sodium 139 135 - 145 mEq/L   Potassium 3.9 3.5 - 5.1 mEq/L   Chloride 103 96 - 112 mEq/L   CO2 28 19 - 32 mEq/L   Glucose, Bld 95 70 - 99 mg/dL   BUN 10 6 - 23 mg/dL   Creatinine, Ser 0.95 0.40 - 1.50 mg/dL   Total Bilirubin 0.5 0.2 - 1.2 mg/dL   Alkaline  Phosphatase 105 39 - 117 U/L   AST 19 0 - 37 U/L   ALT 19 0 - 53 U/L   Total Protein 6.7 6.0 - 8.3 g/dL   Albumin 4.3 3.5 - 5.2 g/dL   GFR 86.24 >60.00 mL/min   Calcium 9.6 8.4 - 10.5 mg/dL  Lipid panel  Result Value Ref Range   Cholesterol 181 0 - 200 mg/dL   Triglycerides 185.0 (H) 0.0 - 149.0 mg/dL   HDL 40.90 >39.00 mg/dL   VLDL 37.0 0.0 - 40.0 mg/dL   LDL Cholesterol 103 (H) 0 - 99 mg/dL   Total CHOL/HDL Ratio 4    NonHDL 139.76    Assessment & Plan:  This visit occurred during the SARS-CoV-2 public health emergency.  Safety protocols were in place, including screening questions prior to the visit, additional usage of staff PPE, and extensive cleaning of exam room while observing appropriate contact time as indicated for disinfecting solutions.   Problem List Items Addressed This Visit    TOBACCO ABUSE    Continue to encourage full cessation. Did not compete 6 mo f/u CT lungs but states ready to proceed with ongoing screening - will send message to lung cancer screening program to re establish with them.       GERD    Stable period on dexilant daily.       Relevant Medications   dexlansoprazole (DEXILANT) 60 MG capsule   Healthcare maintenance - Primary    Preventative protocols reviewed and updated unless pt declined. Discussed healthy diet and lifestyle.       Dyslipidemia    Chronic, off meds. Has previously declined statin.  The 10-year ASCVD risk score Mikey Bussing DC Jr., et al., 2013) is: 13.6%   Values used to calculate the  score:     Age: 24 years     Sex: Male     Is Non-Hispanic African American: No     Diabetic: No     Tobacco smoker: Yes     Systolic Blood Pressure: 078 mmHg     Is BP treated: No     HDL Cholesterol: 40.9 mg/dL     Total Cholesterol: 181 mg/dL       BPH (benign prostatic hyperplasia)    Asxs. PSA stable.       Pulmonary nodules    Overdue for f/u CT - see above.       Emphysema of lung ([place])    Encourage smoking cessation.        Otitis media with effusion, right    Recently completed augmentin course 3 wks ago with benefit, notes ongoing muffled hearing and R ear discomfort, exam with evidence of ongoing effusion. Reviewed anticipated course of recovery, WASP for rpt augmentin course provided in case not recovering or any worsening, and if not better with this he will let me know for ENT referral.       Relevant Medications   amoxicillin-clavulanate (AUGMENTIN) 875-125 MG tablet   Advanced directives, counseling/discussion    Advanced directive: discussed. Doesn't have advanced directive. Would want wife [name] to be HCPOA. Advanced directive provided today.           Meds ordered this encounter  Medications  . nortriptyline (PAMELOR) 50 MG capsule    Sig: Take 1 capsule (50 mg total) by mouth at bedtime.    Dispense:  90 capsule    Refill:  4  . dexlansoprazole (DEXILANT) 60 MG capsule    Sig: Take 1 capsule (60 mg total) by mouth daily.    Dispense:  90 capsule    Refill:  3  . amoxicillin-clavulanate (AUGMENTIN) 875-125 MG tablet    Sig: Take 1 tablet by mouth 2 (two) times daily for 10 days.    Dispense:  20 tablet    Refill:  0   No orders of the defined types were placed in this encounter.   Patient instructions: If interested, check with pharmacy about new 2 shot shingles series (shingrix).  I will send note to lung cancer screening team to help you schedule repeat imaging.  Advanced directive packet provided today You have persistent fluid in the ears - this should continue to improve over next 2 weeks. If ongoing trouble past this, or any worsening, fill antibiotic prescription printed out today. Let us know if not better with treatment.  Good to see you today Return as needed or in 1 year for next physical/wellness visit.   Follow up plan: Return in about 1 year (around 12/24/2021) for annual exam, prior fasting for blood work, medicare wellness visit.  Ria Bush, MD

## 2020-12-24 NOTE — Assessment & Plan Note (Signed)
Asxs. PSA stable.

## 2020-12-24 NOTE — Assessment & Plan Note (Signed)
Preventative protocols reviewed and updated unless pt declined. Discussed healthy diet and lifestyle.  

## 2020-12-24 NOTE — Assessment & Plan Note (Signed)
Chronic, off meds. Has previously declined statin.  The 10-year ASCVD risk score Mikey Bussing DC Brooke Bonito., et al., 2013) is: 13.6%   Values used to calculate the score:     Age: 60 years     Sex: Male     Is Non-Hispanic African American: No     Diabetic: No     Tobacco smoker: Yes     Systolic Blood Pressure: 224 mmHg     Is BP treated: No     HDL Cholesterol: 40.9 mg/dL     Total Cholesterol: 181 mg/dL

## 2020-12-24 NOTE — Assessment & Plan Note (Signed)
Continue to encourage full cessation. Did not compete 6 mo f/u CT lungs but states ready to proceed with ongoing screening - will send message to lung cancer screening program to re establish with them.

## 2020-12-24 NOTE — Telephone Encounter (Signed)
Pt overdue for f/u imaging. Seen in office today.  Ready to reschedule. Pt states best number to reach him at is #[phone].  Thank you!

## 2020-12-24 NOTE — Assessment & Plan Note (Signed)
Stable period on dexilant daily.

## 2020-12-24 NOTE — Patient Instructions (Addendum)
If interested, check with pharmacy about new 2 shot shingles series (shingrix).  I will send note to lung cancer screening team to help you schedule repeat imaging.  Advanced directive packet provided today You have persistent fluid in the ears - this should continue to improve over next 2 weeks. If ongoing trouble past this, or any worsening, fill antibiotic prescription printed out today. Let us know if not better with treatment.  Good to see you today Return as needed or in 1 year for next physical/wellness visit.   Health Maintenance, Male Adopting a healthy lifestyle and getting preventive care are important in promoting health and wellness. Ask your health care provider about:  The right schedule for you to have regular tests and exams.  Things you can do on your own to prevent diseases and keep yourself healthy. What should I know about diet, weight, and exercise? Eat a healthy diet  Eat a diet that includes plenty of vegetables, fruits, low-fat dairy products, and lean protein.  Do not eat a lot of foods that are high in solid fats, added sugars, or sodium.   Maintain a healthy weight Body mass index (BMI) is a measurement that can be used to identify possible weight problems. It estimates body fat based on height and weight. Your health care provider can help determine your BMI and help you achieve or maintain a healthy weight. Get regular exercise Get regular exercise. This is one of the most important things you can do for your health. Most adults should:  Exercise for at least 150 minutes each week. The exercise should increase your heart rate and make you sweat (moderate-intensity exercise).  Do strengthening exercises at least twice a week. This is in addition to the moderate-intensity exercise.  Spend less time sitting. Even light physical activity can be beneficial. Watch cholesterol and blood lipids Have your blood tested for lipids and cholesterol at 62 years of age,  then have this test every 5 years. You may need to have your cholesterol levels checked more often if:  Your lipid or cholesterol levels are high.  You are older than 62 years of age.  You are at high risk for heart disease. What should I know about cancer screening? Many types of cancers can be detected early and may often be prevented. Depending on your health history and family history, you may need to have cancer screening at various ages. This may include screening for:  Colorectal cancer.  Prostate cancer.  Skin cancer.  Lung cancer. What should I know about heart disease, diabetes, and high blood pressure? Blood pressure and heart disease  High blood pressure causes heart disease and increases the risk of stroke. This is more likely to develop in people who have high blood pressure readings, are of African descent, or are overweight.  Talk with your health care provider about your target blood pressure readings.  Have your blood pressure checked: ? Every 3-5 years if you are 94-84 years of age. ? Every year if you are 68 years old or older.  If you are between the ages of 1 and 39 and are a current or former smoker, ask your health care provider if you should have a one-time screening for abdominal aortic aneurysm (AAA). Diabetes Have regular diabetes screenings. This checks your fasting blood sugar level. Have the screening done:  Once every three years after age 68 if you are at a normal weight and have a low risk for diabetes.  More often  and at a younger age if you are overweight or have a high risk for diabetes. What should I know about preventing infection? Hepatitis B If you have a higher risk for hepatitis B, you should be screened for this virus. Talk with your health care provider to find out if you are at risk for hepatitis B infection. Hepatitis C Blood testing is recommended for:  Everyone born from [birth year] through 1967.  Anyone with known risk factors  for hepatitis C. Sexually transmitted infections (STIs)  You should be screened each year for STIs, including gonorrhea and chlamydia, if: ? You are sexually active and are younger than 63 years of age. ? You are older than 62 years of age and your health care provider tells you that you are at risk for this type of infection. ? Your sexual activity has changed since you were last screened, and you are at increased risk for chlamydia or gonorrhea. Ask your health care provider if you are at risk.  Ask your health care provider about whether you are at high risk for HIV. Your health care provider may recommend a prescription medicine to help prevent HIV infection. If you choose to take medicine to prevent HIV, you should first get tested for HIV. You should then be tested every 3 months for as long as you are taking the medicine. Follow these instructions at home: Lifestyle  Do not use any products that contain nicotine or tobacco, such as cigarettes, e-cigarettes, and chewing tobacco. If you need help quitting, ask your health care provider.  Do not use street drugs.  Do not share needles.  Ask your health care provider for help if you need support or information about quitting drugs. Alcohol use  Do not drink alcohol if your health care provider tells you not to drink.  If you drink alcohol: ? Limit how much you have to 0-2 drinks a day. ? Be aware of how much alcohol is in your drink. In the [place] one drink equals one 12 oz bottle of beer (355 mL), one 5 oz glass of wine (148 mL), or one 1 oz glass of hard liquor (44 mL). General instructions  Schedule regular health, dental, and eye exams.  Stay current with your vaccines.  Tell your health care provider if: ? You often feel depressed. ? You have ever been abused or do not feel safe at home. Summary  Adopting a healthy lifestyle and getting preventive care are important in promoting health and wellness.  Follow your health  care provider's instructions about healthy diet, exercising, and getting tested or screened for diseases.  Follow your health care provider's instructions on monitoring your cholesterol and blood pressure. This information is not intended to replace advice given to you by your health care provider. Make sure you discuss any questions you have with your health care provider. Document Revised: 07/19/2018 Document Reviewed: 07/19/2018 Elsevier Patient Education  2021 [employer].

## 2020-12-24 NOTE — Assessment & Plan Note (Signed)
Advanced directive: discussed. Doesn't have advanced directive. Would want wife Michael [name] to be HCPOA. Advanced directive provided today.

## 2020-12-24 NOTE — Progress Notes (Signed)
Contacted and scheduled for annual lung screening scan. Patient is a current smoker with a 30.75 pack year history. 

## 2020-12-24 NOTE — Assessment & Plan Note (Signed)
Recently completed augmentin course 3 wks ago with benefit, notes ongoing muffled hearing and R ear discomfort, exam with evidence of ongoing effusion. Reviewed anticipated course of recovery, WASP for rpt augmentin course provided in case not recovering or any worsening, and if not better with this he will let me know for ENT referral.

## 2020-12-24 NOTE — Assessment & Plan Note (Signed)
Encourage smoking cessation

## 2020-12-31 ENCOUNTER — Other Ambulatory Visit: Payer: Self-pay

## 2020-12-31 ENCOUNTER — Ambulatory Visit
Admission: RE | Admit: 2020-12-31 | Discharge: 2020-12-31 | Disposition: A | Discharge status: Home / Self Care | Payer: Medicare PPO | Source: Ambulatory Visit | Attending: Oncology | Admitting: Oncology

## 2020-12-31 DIAGNOSIS — Z87891 Personal history of nicotine dependence: Secondary | ICD-10-CM | POA: Insufficient documentation

## 2020-12-31 DIAGNOSIS — F17200 Nicotine dependence, unspecified, uncomplicated: Secondary | ICD-10-CM | POA: Insufficient documentation

## 2020-12-31 DIAGNOSIS — Z122 Encounter for screening for malignant neoplasm of respiratory organs: Secondary | ICD-10-CM | POA: Insufficient documentation

## 2021-01-07 ENCOUNTER — Encounter: Payer: Self-pay | Admitting: *Deleted

## 2021-01-23 ENCOUNTER — Telehealth: Payer: Self-pay | Admitting: Family Medicine

## 2021-01-23 NOTE — Telephone Encounter (Signed)
Paperwork given to Dr Danise Mina to complete and we will resubmit when finished

## 2021-01-23 NOTE — Telephone Encounter (Signed)
Patty from [place] returned my call- she is faxing the needed paperwork directly to the fax at [employer], attn Tam.  I will double check with Dr Danise Mina if this is the paperwork he had already completed or not.  Chong Sicilian provided a return fax # of [phone]

## 2021-01-23 NOTE — Telephone Encounter (Signed)
Bebe Liter is calling wondering about paperwork for this pt.  I will call and get more info

## 2021-01-23 NOTE — Telephone Encounter (Signed)
Michael [name] is sending a physician statement paperwork to be completed and returned

## 2021-01-26 NOTE — Telephone Encounter (Signed)
Filled and in my out box.  Will need to go to medical records for requested records over past 2 yrs.

## 2021-01-26 NOTE — Telephone Encounter (Signed)
Paperwork completed and faxed  Copy for scan   Copy retained by me  Request sent to medical records to transfer last 2 years of medical records to [facility]

## 2021-02-08 ENCOUNTER — Other Ambulatory Visit: Payer: Self-pay | Admitting: Family Medicine

## 2021-02-25 ENCOUNTER — Ambulatory Visit (AMBULATORY_SURGERY_CENTER): Payer: Medicare PPO | Admitting: *Deleted

## 2021-02-25 ENCOUNTER — Other Ambulatory Visit: Payer: Self-pay

## 2021-02-25 VITALS — Ht 68.0 in | Wt 152.0 lb

## 2021-02-25 DIAGNOSIS — Z86010 Personal history of colonic polyps: Secondary | ICD-10-CM

## 2021-02-25 MED ORDER — NA SULFATE-K SULFATE-MG SULF 17.5-3.13-1.6 GM/177ML PO SOLN: ORAL | 0 refills | Status: DC

## 2021-02-25 NOTE — Progress Notes (Signed)
Patient's pre-visit was done today over the phone with the patient and wife due to COVID-19 pandemic. Name,DOB and address verified. Insurance verified. Patient denies any allergies to Eggs and Soy. Patient denies any problems with anesthesia/sedation. Patient denies taking diet pills or blood thinners. No home Oxygen. Packet of Prep instructions mailed to patient including a copy of a consent form-pt is aware. Patient understands to call us back with any questions or concerns. Patient is aware of our care-partner policy and [id number] safety protocol. Patient denies constipation.   EMMI education assigned to the patient for the procedure, sent to [facility].   The patient is COVID-19 vaccinated, per patient.

## 2021-03-09 ENCOUNTER — Encounter: Payer: Self-pay | Admitting: Gastroenterology

## 2021-03-09 ENCOUNTER — Other Ambulatory Visit: Payer: Self-pay

## 2021-03-09 ENCOUNTER — Ambulatory Visit (AMBULATORY_SURGERY_CENTER): Payer: Medicare PPO | Admitting: Gastroenterology

## 2021-03-09 VITALS — BP 118/76 | HR 72 | Temp 97.5°F | Resp 14 | Ht 68.0 in | Wt 152.0 lb

## 2021-03-09 DIAGNOSIS — D123 Benign neoplasm of transverse colon: Secondary | ICD-10-CM | POA: Diagnosis not present

## 2021-03-09 DIAGNOSIS — Z1211 Encounter for screening for malignant neoplasm of colon: Secondary | ICD-10-CM | POA: Diagnosis not present

## 2021-03-09 DIAGNOSIS — D125 Benign neoplasm of sigmoid colon: Secondary | ICD-10-CM

## 2021-03-09 DIAGNOSIS — Z86010 Personal history of colonic polyps: Secondary | ICD-10-CM | POA: Diagnosis not present

## 2021-03-09 DIAGNOSIS — D122 Benign neoplasm of ascending colon: Secondary | ICD-10-CM | POA: Diagnosis not present

## 2021-03-09 DIAGNOSIS — D124 Benign neoplasm of descending colon: Secondary | ICD-10-CM

## 2021-03-09 HISTORY — PX: COLONOSCOPY: SHX174

## 2021-03-09 MED ORDER — SODIUM CHLORIDE 0.9 % IV SOLN: 500.0000 mL | Freq: Once | INTRAVENOUS | Status: DC

## 2021-03-09 NOTE — Progress Notes (Signed)
Pt's states no medical or surgical changes since previsit or office visit.   Check-in-AER  V/S-SM

## 2021-03-09 NOTE — Patient Instructions (Signed)
Resume previous medications.  5 polyps removed and sent to pathology.  Await pathology for final recommendations.  Internal Handouts on findings given to patient.     YOU HAD AN ENDOSCOPIC PROCEDURE TODAY AT [facility]:   Refer to the procedure report that was given to you for any specific questions about what was found during the examination.  If the procedure report does not answer your questions, please call your gastroenterologist to clarify.  If you requested that your care partner not be given the details of your procedure findings, then the procedure report has been included in a sealed envelope for you to review at your convenience later.  YOU SHOULD EXPECT: Some feelings of bloating in the abdomen. Passage of more gas than usual.  Walking can help get rid of the air that was put into your GI tract during the procedure and reduce the bloating. If you had a lower endoscopy (such as a colonoscopy or flexible sigmoidoscopy) you may notice spotting of blood in your stool or on the toilet paper. If you underwent a bowel prep for your procedure, you may not have a normal bowel movement for a few days.  Please Note:  You might notice some irritation and congestion in your nose or some drainage.  This is from the oxygen used during your procedure.  There is no need for concern and it should clear up in a day or so.  SYMPTOMS TO REPORT IMMEDIATELY:  Following lower endoscopy (colonoscopy or flexible sigmoidoscopy):  Excessive amounts of blood in the stool  Significant tenderness or worsening of abdominal pains  Swelling of the abdomen that is new, acute  Fever of 100F or higher   For urgent or emergent issues, a gastroenterologist can be reached at any hour by calling [phone]. Do not use MyChart messaging for urgent concerns.    DIET:  We do recommend a small meal at first, but then you may proceed to your regular diet.  Drink plenty of fluids but you should avoid  alcoholic beverages for 24 hours.  ACTIVITY:  You should plan to take it easy for the rest of today and you should NOT DRIVE or use heavy machinery until tomorrow (because of the sedation medicines used during the test).    FOLLOW UP: Our staff will call the number listed on your records 48-72 hours following your procedure to check on you and address any questions or concerns that you may have regarding the information given to you following your procedure. If we do not reach you, we will leave a message.  We will attempt to reach you two times.  During this call, we will ask if you have developed any symptoms of COVID 19. If you develop any symptoms (ie: fever, flu-like symptoms, shortness of breath, cough etc.) before then, please call [phone].  If you test positive for Covid 19 in the 2 weeks post procedure, please call and report this information to [place].    If any biopsies were taken you will be contacted by phone or by letter within the next 1-3 weeks.  Please call us at [phone] if you have not heard about the biopsies in 3 weeks.    SIGNATURES/CONFIDENTIALITY: You and/or your care partner have signed paperwork which will be entered into your electronic medical record.  These signatures attest to the fact that that the information above on your After Visit Summary has been reviewed and is understood.  Full responsibility of the confidentiality  of this discharge information lies with you and/or your care-partner.  

## 2021-03-09 NOTE — Progress Notes (Signed)
A and O x3. Report to RN. Tolerated MAC anesthesia well. 

## 2021-03-09 NOTE — Progress Notes (Signed)
Called to room to assist during endoscopic procedure.  Patient ID and intended procedure confirmed with present staff. Received instructions for my participation in the procedure from the performing physician.  

## 2021-03-09 NOTE — Op Note (Signed)
[place] Patient Name: Michael [name] Procedure Date: 03/09/2021 10:23 AM MRN: [id number] Endoscopist: Ladene Artist , MD Age: 60 Referring MD:  Date of Birth: [birth year] Gender: Male Account #: [id number] Procedure:                Colonoscopy Indications:              Surveillance: History of numerous adenomas on last                            colonoscopy (< 3 yrs) Medicines:                Monitored Anesthesia Care Procedure:                Pre-Anesthesia Assessment:                           - Prior to the procedure, a History and Physical                            was performed, and patient medications and                            allergies were reviewed. The patient's tolerance of                            previous anesthesia was also reviewed. The risks                            and benefits of the procedure and the sedation                            options and risks were discussed with the patient.                            All questions were answered, and informed consent                            was obtained. Prior Anticoagulants: The patient has                            taken no previous anticoagulant or antiplatelet                            agents. ASA Grade Assessment: II - A patient with                            mild systemic disease. After reviewing the risks                            and benefits, the patient was deemed in                            satisfactory condition to undergo the procedure.  After obtaining informed consent, the colonoscope                            was passed under direct vision. Throughout the                            procedure, the patient's blood pressure, pulse, and                            oxygen saturations were monitored continuously. The                            Olympus CF-HQ190L ([id number]) Colonoscope was                            introduced through the anus and advanced to  the the                            cecum, identified by appendiceal orifice and                            ileocecal valve. The ileocecal valve, appendiceal                            orifice, and rectum were photographed. The quality                            of the bowel preparation was adequate after                            extensive lavage, suction. The colonoscopy was                            performed without difficulty. The patient tolerated                            the procedure well. Scope In: 11:12:58 AM Scope Out: 11:28:27 AM Scope Withdrawal Time: 0 hours 13 minutes 42 seconds  Total Procedure Duration: 0 hours 15 minutes 29 seconds  Findings:                 The perianal and digital rectal examinations were                            normal.                           Five sessile polyps were found in the sigmoid colon                            (2), descending colon (1), transverse colon (1) and                            ascending colon (1). The polyps were 7 to 9 mm in  size. These polyps were removed with a cold snare.                            Resection and retrieval were complete.                           Internal hemorrhoids were found during                            retroflexion. The hemorrhoids were moderate and                            Grade I (internal hemorrhoids that do not prolapse).                           The exam was otherwise without abnormality on                            direct and retroflexion views. Complications:            No immediate complications. Estimated blood loss:                            None. Estimated Blood Loss:     Estimated blood loss: none. Impression:               - Five 7 to 9 mm polyps in the sigmoid colon, in                            the descending colon, in the transverse colon and                            in the ascending colon, removed with a cold snare.                             Resected and retrieved.                           - Internal hemorrhoids.                           - The examination was otherwise normal on direct                            and retroflexion views. Recommendation:           - Repeat colonoscopy, likely 3 years, after studies                            are complete for surveillance based on pathology                            results with a more extensive bowel prep.                           - Patient has a  contact number available for                            emergencies. The signs and symptoms of potential                            delayed complications were discussed with the                            patient. Return to normal activities tomorrow.                            Written discharge instructions were provided to the                            patient.                           - Resume previous diet.                           - Continue present medications.                           - Stop smoking.                           - Await pathology results. Ladene Artist, MD 03/09/2021 11:36:31 AM This report has been signed electronically.

## 2021-03-11 ENCOUNTER — Telehealth: Payer: Self-pay | Admitting: *Deleted

## 2021-03-11 NOTE — Telephone Encounter (Signed)
  Follow up Call-  Call back number 03/09/2021 01/30/2019  Post procedure Call Back phone  # [phone] wife number ok to talk with her [phone]  Permission to leave phone message Yes Yes  Some recent data might be hidden     Patient questions:  Do you have a fever, pain , or abdominal swelling? No. Pain Score  0 *  Have you tolerated food without any problems? Yes.    Have you been able to return to your normal activities? Yes.    Do you have any questions about your discharge instructions: Diet   No. Medications  No. Follow up visit  No.  Do you have questions or concerns about your Care? No.  Actions: * If pain score is 4 or above: No action needed, pain <4.  Have you developed a fever since your procedure? no  2.   Have you had an respiratory symptoms (SOB or cough) since your procedure? no  3.   Have you tested positive for COVID 19 since your procedure no  4.   Have you had any family members/close contacts diagnosed with the COVID 19 since your procedure?  no   If yes to any of these questions please route to Joylene John, RN and Joella Prince, RN

## 2021-03-27 ENCOUNTER — Encounter: Payer: Self-pay | Admitting: Gastroenterology

## 2021-07-16 ENCOUNTER — Other Ambulatory Visit: Payer: Self-pay | Admitting: Family Medicine

## 2021-07-17 ENCOUNTER — Telehealth (INDEPENDENT_AMBULATORY_CARE_PROVIDER_SITE_OTHER): Payer: Medicare PPO | Admitting: Family Medicine

## 2021-07-17 ENCOUNTER — Encounter: Payer: Self-pay | Admitting: *Deleted

## 2021-07-17 ENCOUNTER — Other Ambulatory Visit: Payer: Self-pay

## 2021-07-17 ENCOUNTER — Encounter: Payer: Self-pay | Admitting: Family Medicine

## 2021-07-17 VITALS — BP 112/82 | HR 111 | Temp 97.0°F | Ht 68.0 in

## 2021-07-17 DIAGNOSIS — R051 Acute cough: Secondary | ICD-10-CM | POA: Diagnosis not present

## 2021-07-17 LAB — POC COVID19 BINAXNOW: SARS Coronavirus 2 Ag: NEGATIVE

## 2021-07-17 LAB — POC INFLUENZA A&B (BINAX/QUICKVUE)
Influenza A, POC: NEGATIVE
Influenza B, POC: NEGATIVE

## 2021-07-17 MED ORDER — AZITHROMYCIN 250 MG PO TABS: ORAL_TABLET | ORAL | 0 refills | Status: DC

## 2021-07-17 MED ORDER — DEXLANSOPRAZOLE 60 MG PO CPDR: 60.0000 mg | DELAYED_RELEASE_CAPSULE | Freq: Every day | ORAL | 1 refills | Status: DC

## 2021-07-17 NOTE — Progress Notes (Addendum)
VIRTUAL VISIT Due to national recommendations of social distancing due to [place] 19, a virtual visit is felt to be most appropriate for this patient at this time.   I connected with the patient on 07/17/21 at 11:40 AM EST by virtual telehealth platform and verified that I am speaking with the correct person using two identifiers.   I discussed the limitations, risks, security and privacy concerns of performing an evaluation and management service by  virtual telehealth platform and the availability of in person appointments. I also discussed with the patient that there may be a patient responsible charge related to this service. The patient expressed understanding and agreed to proceed.  Patient location: Home Provider Location: [facility] [place] Participants: Eliezer Lofts and [name]   Chief Complaint  Patient presents with   Cough    Dry-symptoms started Wednesday night-No Covid test   Sinus Pressure   Chills    History of Present Illness:  60 year old male patient of Dr. Darnell Level with history of tobacco abuse, emphysema per CT presents with new onset  Date of onset:  07/15/2021  Started with nasal congestion, dry cough, ST, headache.  Facial; pressure and pain. Fatigue, mild body aches.  Chills, no temperature.  No SOB, no wheeze.   He has been taking flonase.      COVID 19 screen COVID testing: none COVID vaccine: 11/27/2019 , 11/02/2019, no flu shot COVID exposure: No recent travel or known exposure to [place]  The importance of social distancing was discussed today.     Review of Systems  Constitutional:  Positive for chills.  HENT:  Positive for congestion, sinus pain and sore throat.   Eyes:  Negative for pain.  Respiratory:  Positive for cough. Negative for shortness of breath and wheezing.   Cardiovascular:  Negative for chest pain.     Past Medical History:  Diagnosis Date   Adenomatous colon polyp 02/2008   AK (actinic keratosis)    Allergy    Basal  cell carcinoma of face    Mohs procedure   COPD (chronic obstructive pulmonary disease) ([place])    Dyslipidemia 02/29/2016   Esophagitis 02/13/2001   Gastritis 02/13/2001   GERD (gastroesophageal reflux disease)    Hemorrhoids    Hiatal hernia    HNP (herniated nucleus pulposus), cervical 09/30/2016   With radiculopathy, s/p anterior discectomy Vertell Limber) 12/2016   Reactive depression (situational)    Renal stones    Smoker     reports that he has been smoking cigarettes. He has a 30.00 pack-year smoking history. He has never used smokeless tobacco. He reports that he does not drink alcohol and does not use drugs.   Current Outpatient Medications:    Bioflavonoid Products (ESTER C PO), Take by mouth daily., Disp: , Rfl:    Cholecalciferol (VITAMIN D3 PO), Take by mouth., Disp: , Rfl:    cyanocobalamin 100 MCG tablet, Take 100 mcg by mouth daily. , Disp: , Rfl:    dexlansoprazole (DEXILANT) 60 MG capsule, Take 1 capsule (60 mg total) by mouth daily., Disp: 90 capsule, Rfl: 3   fish oil-omega-3 fatty acids 1000 MG capsule, Take 2 g by mouth daily., Disp: , Rfl:    fluticasone (FLONASE) 50 MCG/ACT nasal spray, SPRAY 2 SPRAYS INTO EACH NOSTRIL EVERY DAY, Disp: 48 mL, Rfl: 1   HORIZANT 300 MG TBCR, Take 1 tablet by mouth at bedtime., Disp: , Rfl:    Multiple Vitamin (MULTIVITAMIN) tablet, Take 1 tablet by mouth daily.,  Disp: , Rfl:    nortriptyline (PAMELOR) 50 MG capsule, Take 1 capsule (50 mg total) by mouth at bedtime., Disp: 90 capsule, Rfl: 4   oxyCODONE-acetaminophen (PERCOCET/ROXICET) 5-325 MG tablet, Take 1 tablet by mouth at bedtime., Disp: , Rfl:    Probiotic Product (PROBIOTIC PO), Take by mouth., Disp: , Rfl:    tiZANidine (ZANAFLEX) 2 MG tablet, Take 2 tablets by mouth at bedtime. Takes as needed, Disp: , Rfl:   Current Facility-Administered Medications:    0.9 %  sodium chloride infusion, 500 mL, Intravenous, Once, Ladene Artist, MD   Observations/Objective: Blood pressure  112/82, pulse (!) 111, temperature (!) 97 F (36.1 C), temperature source Oral, height 5\' 8"  (1.727 m).  Physical Exam  Physical Exam Constitutional:      General: The patient is not in acute distress. Pulmonary:     Effort: Pulmonary effort is normal. No respiratory distress.  Neurological:     Mental Status: The patient is alert and oriented to person, place, and time.  Psychiatric:        Mood and Affect: Mood normal.        Behavior: Behavior normal.   Assessment and Plan  Problem List Items Addressed This Visit     Acute cough - Primary    Viral infection likely.  symptomatic care.  Drive up testing for flu and COVID.   No clear s/s of COPD exacerbation.. no sputum change or SOB.    ER precautions given.      Relevant Orders   POC COVID-19 BinaxNow   POC Influenza A&B(BINAX/QUICKVUE)    I discussed the assessment and treatment plan with the patient. The patient was provided an opportunity to ask questions and all were answered. The patient agreed with the plan and demonstrated an understanding of the instructions.   The patient was advised to call back or seek an in-person evaluation if the symptoms worsen or if the condition fails to improve as anticipated.   ADDENDUM: Flu and COVID tests negative. Continue flonase 2 spray per nostril and use mucinex DM twice daily.  Tylenol or ibuprofen for sinus pressure.  If not improving after 3-4 days.. fill prescription for antibiotics given smoking and emphysema history.   Call sooner if shortness of breath. Meds ordered this encounter  Medications   azithromycin (ZITHROMAX) 250 MG tablet    Sig: Fill if not improving in 3-4 days, VOID after 08/09/2021 tab po x 1 day then 1 tab po daily    Dispense:  6 tablet    Refill:  0       Eliezer Lofts, MD

## 2021-07-17 NOTE — Patient Instructions (Signed)
Negative flu and COVID test.  Continue flonase 2 spray per nostril and use mucinex DM twice daily.  Tylenol or ibuprofen for sinus pressure.  If not improving after 3-4 days.. fill prescription for antibiotics given smoking and emphysema history.   Call sooner if shortness of breath

## 2021-07-17 NOTE — Telephone Encounter (Signed)
This encounter was created in error - please disregard.

## 2021-07-17 NOTE — Assessment & Plan Note (Addendum)
Viral infection likely.  symptomatic care.  Drive up testing for flu and COVID.   No clear s/s of COPD exacerbation.. no sputum change or SOB.    ER precautions given.

## 2021-07-17 NOTE — Addendum Note (Signed)
Addended byEliezer Lofts E on: 07/17/2021 02:24 PM   Modules accepted: Orders

## 2021-07-20 ENCOUNTER — Telehealth: Payer: Self-pay | Admitting: Family Medicine

## 2021-07-20 NOTE — Telephone Encounter (Signed)
See other phone note

## 2021-07-20 NOTE — Addendum Note (Signed)
Addended by: Ria Bush on: 07/20/2021 06:08 PM   Modules accepted: Orders

## 2021-07-20 NOTE — Telephone Encounter (Signed)
He's had 5 days of symptoms only - this would be most consistent with viral sinusitis.  Did they start antibiotic already? Recommendation was to hold antibiotic and start taking if ongoing symptoms into this week.  What symptoms is he currently having?

## 2021-07-20 NOTE — Telephone Encounter (Signed)
Pt had virtual visit for Dr. Diona Browner on 07/17/21 for sinus sxs and given Z-pak.

## 2021-07-20 NOTE — Telephone Encounter (Signed)
Per previous phone note,  Z-pak never works and patient's wife is requesting amoxicillin.

## 2021-07-20 NOTE — Telephone Encounter (Signed)
Pts wife called saying the z pack that was called in never works for him. Requesting amoxicillin.

## 2021-07-21 MED ORDER — AMOXICILLIN-POT CLAVULANATE 875-125 MG PO TABS: 1.0000 | ORAL_TABLET | Freq: Two times a day (BID) | ORAL | 0 refills | Status: DC

## 2021-07-21 MED ORDER — AMOXICILLIN-POT CLAVULANATE 875-125 MG PO TABS: 1.0000 | ORAL_TABLET | Freq: Two times a day (BID) | ORAL | 0 refills | Status: AC

## 2021-07-21 NOTE — Addendum Note (Signed)
Addended by: Ria Bush on: 07/21/2021 01:50 PM   Modules accepted: Orders

## 2021-07-21 NOTE — Telephone Encounter (Signed)
Spoke with pt relaying Dr. Synthia Innocent message.  Pt states he never started abx b/c the last 2x he's taken it, still had sxs.  C/o sinus pressure/congestion and ST.  Says he doesn't want to waste time with z-pak and asks Dr. Darnell Level to send amoxicillin.

## 2021-07-21 NOTE — Telephone Encounter (Signed)
I've sent in amoxicillin/clavulanate course for him to take, but recommend he wait 24-48 hours to see if symptoms improve on their own prior to taking.

## 2021-07-21 NOTE — Telephone Encounter (Signed)
Spoke with pt relaying Dr. G's message.  Pt verbalizes understanding and expresses his thanks.  

## 2021-07-23 ENCOUNTER — Telehealth: Payer: Self-pay | Admitting: Family Medicine

## 2021-07-23 MED ORDER — FLUTICASONE PROPIONATE 50 MCG/ACT NA SUSP: NASAL | 1 refills | Status: DC

## 2021-07-23 NOTE — Telephone Encounter (Signed)
E-scribed refill 

## 2021-07-23 NOTE — Telephone Encounter (Signed)
°  Encourage patient to contact the pharmacy for refills or they can request refills through [place]:  Please schedule appointment if longer than 1 year  NEXT APPOINTMENT DATE:  MEDICATION:fluticasone (FLONASE) 50 MCG/ACT nasal spray   Is the patient out of medication?   PHARMACY:[id number] - [place], [place] - [address]    Let patient know to contact pharmacy at the end of the day to make sure medication is ready.  Please notify patient to allow 48-72 hours to process  CLINICAL FILLS OUT ALL BELOW:   LAST REFILL:  QTY:  REFILL DATE:    OTHER COMMENTS:    Okay for refill?  Please advise

## 2021-10-04 ENCOUNTER — Other Ambulatory Visit: Payer: Self-pay | Admitting: Family Medicine

## 2021-10-29 IMAGING — CT CT CHEST LUNG CANCER SCREENING LOW DOSE W/O CM
2 of 5 series · 15 of 40 positions shown, 18 images · non-contrast
Comparison: 01/01/2020

CLINICAL DATA: Thirty pack-year smoking history.  Current smoker.

EXAM:
CT CHEST WITHOUT CONTRAST LOW-DOSE FOR LUNG CANCER SCREENING
TECHNIQUE: Multidetector CT imaging of the chest was performed following the
standard protocol without IV contrast.

[Series 3: lung 1.00 · axial · 0.69mm/px · z∈[-1193,-886]mm · 12 of 341 slices shown, 15 images]
[im 17/341  mediastinal]
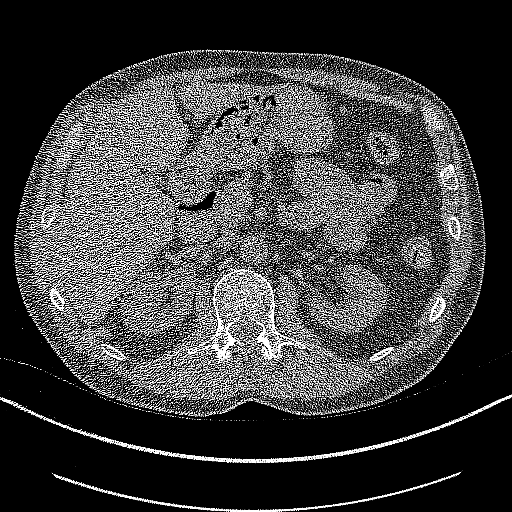
[im 17/341  lung]
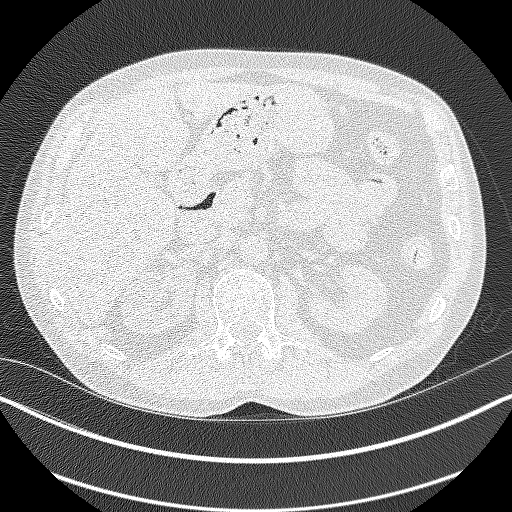
[im 49/341  lung]
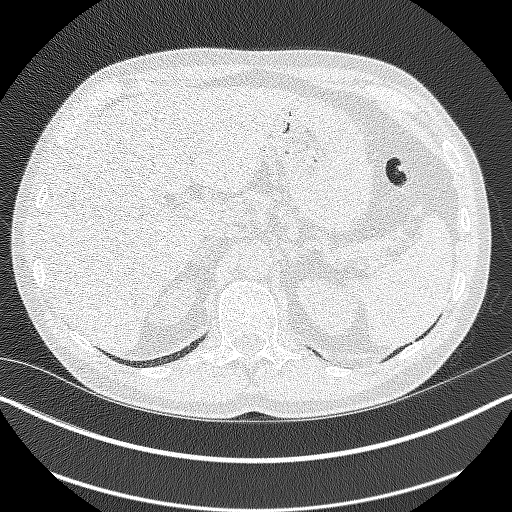
[im 81/341  lung]
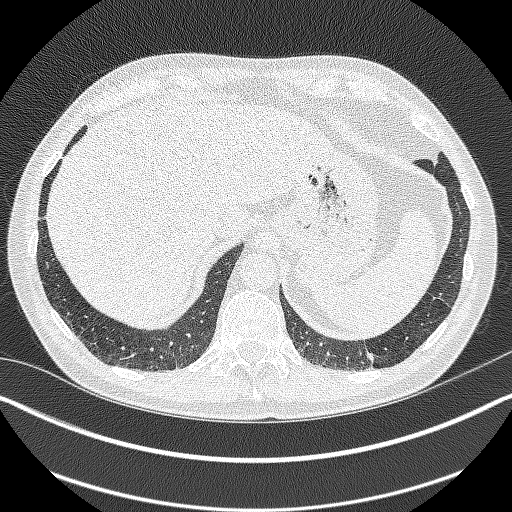
[im 98/341  lung]
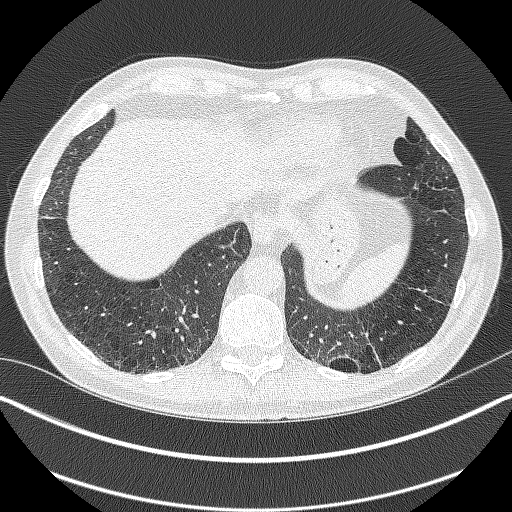
[im 130/341  mediastinal]
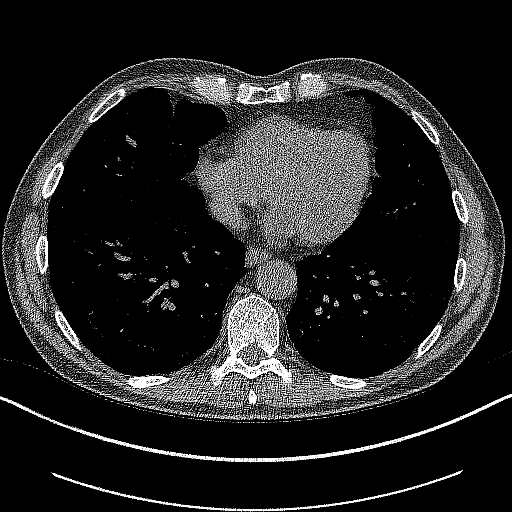
[im 130/341  lung]
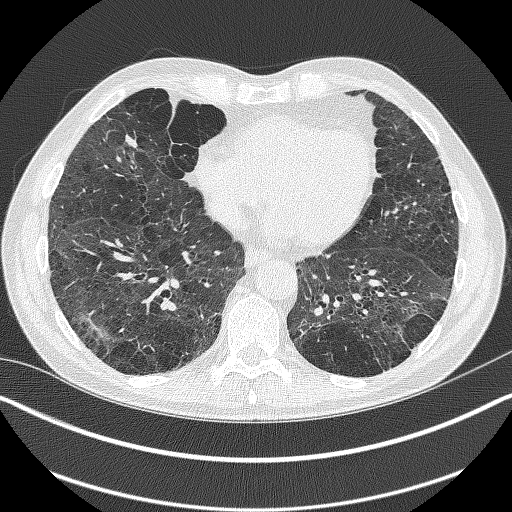
[im 162/341  lung]
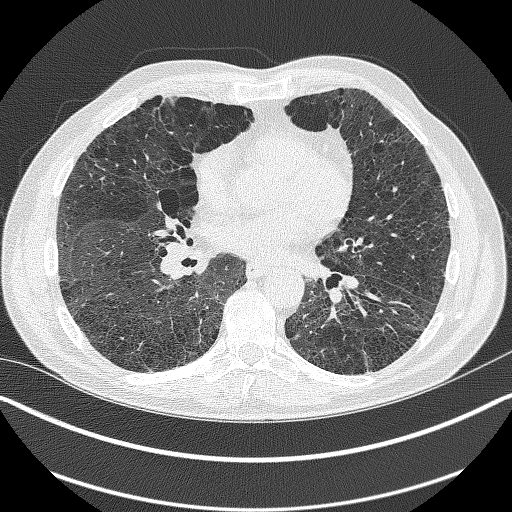
[im 179/341  lung]
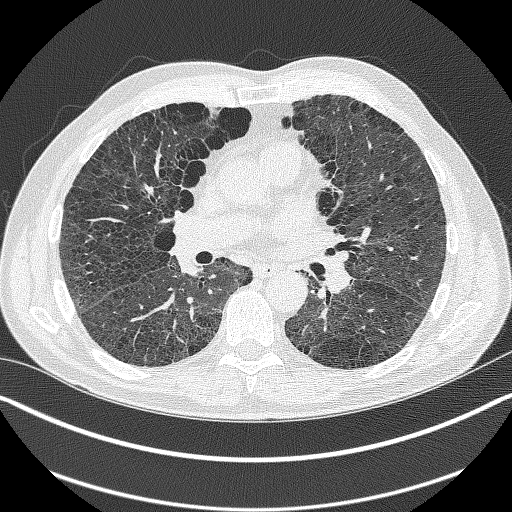
[im 211/341  lung]
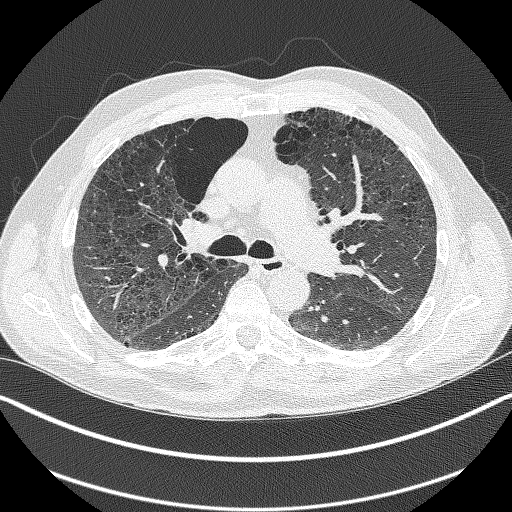
[im 243/341  mediastinal]
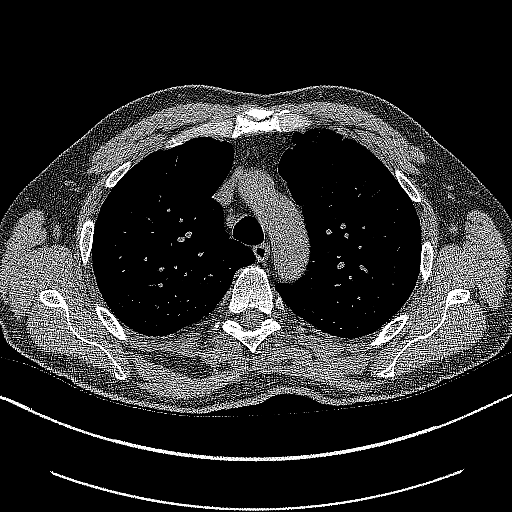
[im 243/341  lung]
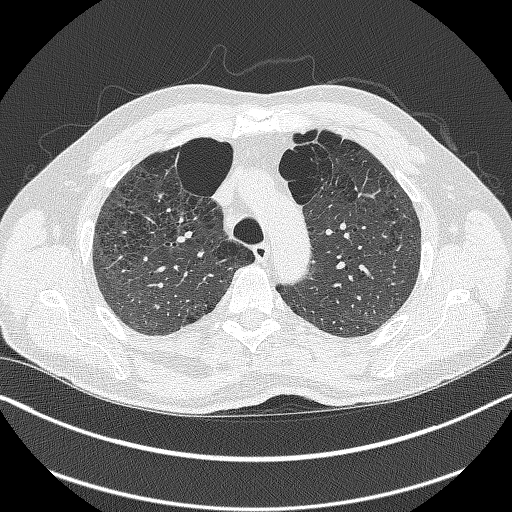
[im 260/341  lung]
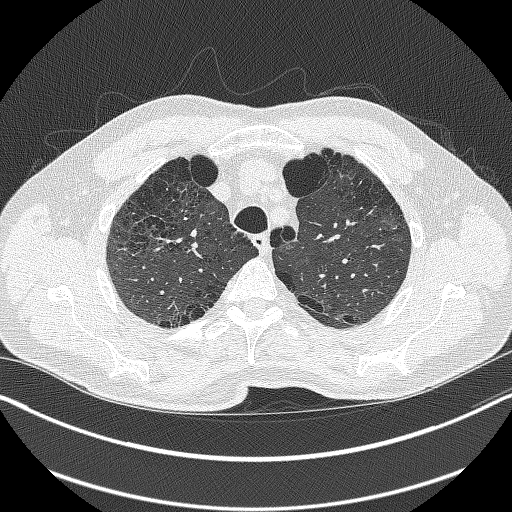
[im 292/341  lung]
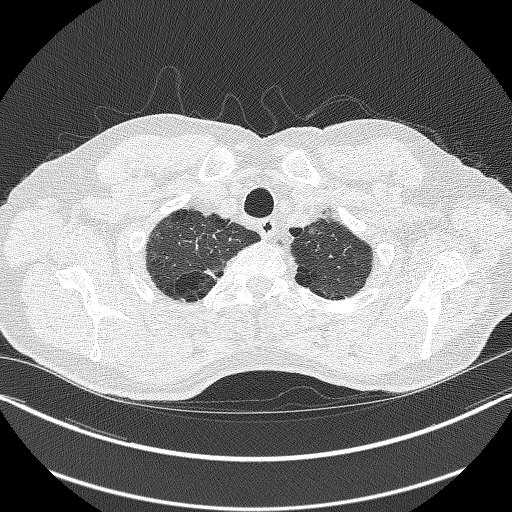
[im 324/341  lung]
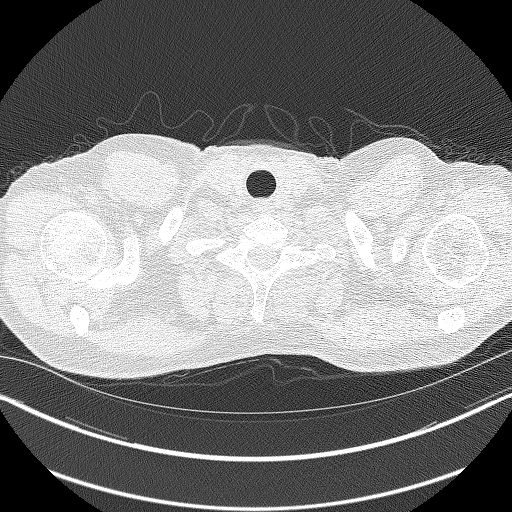

[Series 5: coronals lung 1.00 cor · coronal · 0.67mm/px · 3 of 265 slices shown]
[im 53/265  lung]
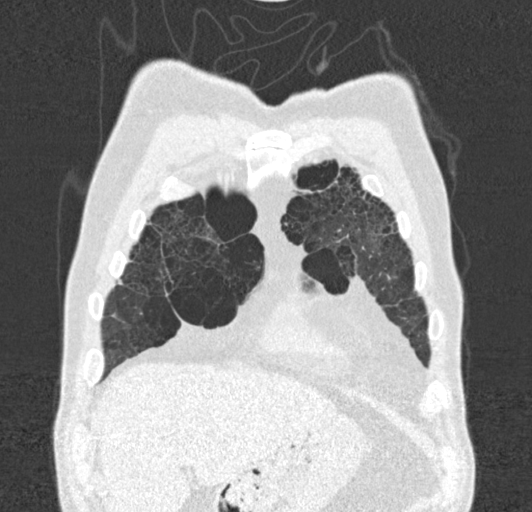
[im 106/265  lung]
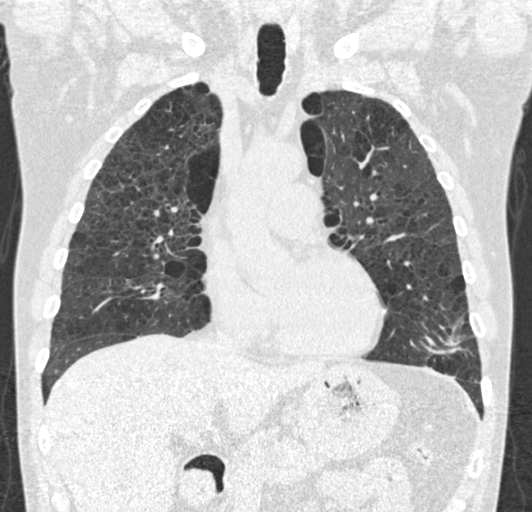
[im 159/265  lung]
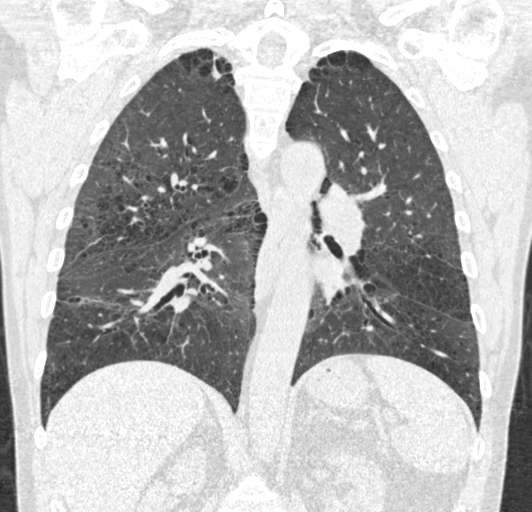

[15 of 40 positions shown; findings below may reference images not displayed]

FINDINGS: Cardiovascular: Normal aortic caliber. Normal heart size, without
pericardial effusion.

Mediastinum/Nodes: No mediastinal or definite hilar adenopathy,
given limitations of unenhanced CT.

Lungs/Pleura: No pleural fluid. Moderate to marked bullous type
emphysema. Pulmonary nodules of maximally volume derived equivalent
diameter 8.5 mm, similar.

Upper Abdomen: Normal imaged portions of the liver, spleen,
pancreas, gallbladder, adrenal glands, kidneys. Proximal gastric
underdistention. Greater curvature gastric wall apparent thickening
may be secondary. This was likely present on the prior.

Musculoskeletal: No acute osseous abnormality.
IMPRESSION: 1. Lung-RADS 2, benign appearance or behavior. Continue annual
screening with low-dose chest CT without contrast in 12 months.
2. Emphysema ([id number]).
3. Apparent greater curvature gastric wall thickening, at least
partially felt to be due to underdistention. Correlate with any
symptoms to suggest gastritis.

## 2021-11-11 DIAGNOSIS — D485 Neoplasm of uncertain behavior of skin: Secondary | ICD-10-CM | POA: Diagnosis not present

## 2021-11-11 DIAGNOSIS — C4442 Squamous cell carcinoma of skin of scalp and neck: Secondary | ICD-10-CM | POA: Diagnosis not present

## 2021-11-11 DIAGNOSIS — L728 Other follicular cysts of the skin and subcutaneous tissue: Secondary | ICD-10-CM | POA: Diagnosis not present

## 2021-11-11 DIAGNOSIS — L570 Actinic keratosis: Secondary | ICD-10-CM | POA: Diagnosis not present

## 2021-11-11 DIAGNOSIS — Z08 Encounter for follow-up examination after completed treatment for malignant neoplasm: Secondary | ICD-10-CM | POA: Diagnosis not present

## 2021-11-11 DIAGNOSIS — L708 Other acne: Secondary | ICD-10-CM | POA: Diagnosis not present

## 2021-11-11 DIAGNOSIS — Z85828 Personal history of other malignant neoplasm of skin: Secondary | ICD-10-CM | POA: Diagnosis not present

## 2021-11-11 DIAGNOSIS — L720 Epidermal cyst: Secondary | ICD-10-CM | POA: Diagnosis not present

## 2021-12-07 DIAGNOSIS — C4442 Squamous cell carcinoma of skin of scalp and neck: Secondary | ICD-10-CM | POA: Diagnosis not present

## 2021-12-07 DIAGNOSIS — D485 Neoplasm of uncertain behavior of skin: Secondary | ICD-10-CM | POA: Diagnosis not present

## 2021-12-07 DIAGNOSIS — L905 Scar conditions and fibrosis of skin: Secondary | ICD-10-CM | POA: Diagnosis not present

## 2021-12-18 ENCOUNTER — Ambulatory Visit (INDEPENDENT_AMBULATORY_CARE_PROVIDER_SITE_OTHER): Payer: Medicare PPO

## 2021-12-18 VITALS — Ht 67.0 in | Wt 154.0 lb

## 2021-12-18 DIAGNOSIS — Z0000 Encounter for general adult medical examination without abnormal findings: Secondary | ICD-10-CM | POA: Diagnosis not present

## 2021-12-18 NOTE — Progress Notes (Signed)
?I connected with Michael [name] today by telephone and verified that I am speaking with the correct person using two identifiers. ?Location patient: home ?Location provider: work ?Persons participating in the virtual visit: Michael [name], Glenna Durand LPN. ?  ?I discussed the limitations, risks, security and privacy concerns of performing an evaluation and management service by telephone and the availability of in person appointments. I also discussed with the patient that there may be a patient responsible charge related to this service. The patient expressed understanding and verbally consented to this telephonic visit.  ?  ?Interactive audio and video telecommunications were attempted between this provider and patient, however failed, due to patient having technical difficulties OR patient did not have access to video capability.  We continued and completed visit with audio only. ? ?  ? ?Vital signs may be patient reported or missing. ? ?Subjective:  ? Michael [name] is a 61 y.o. male who presents for Medicare Annual/Subsequent preventive examination. ? ?Review of Systems    ? ?Cardiac Risk Factors include: advanced age (>74mn, >>94women);male gender ? ?   ?Objective:  ?  ?Today's Vitals  ? 12/18/21 1411  ?Weight: 154 lb (69.9 kg)  ?Height: '5\' 7"'$  (1.702 m)  ? ?Body mass index is 24.12 kg/m?. ? ? ?  12/18/2021  ?  2:15 PM 12/17/2020  ? 12:04 PM 01/30/2019  ? 12:22 PM 11/16/2013  ?  1:32 PM  ?Advanced Directives  ?Does Patient Have a Medical Advance Directive? No No No Patient does not have advance directive  ?Would patient like information on creating a medical advance directive?  No - Patient declined No - Patient declined   ? ? ?Current Medications (verified) ?Outpatient Encounter Medications as of 12/18/2021  ?Medication Sig  ? Bioflavonoid Products (ESTER C PO) Take by mouth daily.  ? Cholecalciferol (VITAMIN D3 PO) Take by mouth.  ? cyanocobalamin 100 MCG tablet Take 100 mcg by mouth daily.   ? dexlansoprazole  (DEXILANT) 60 MG capsule Take 1 capsule (60 mg total) by mouth daily.  ? fish oil-omega-3 fatty acids 1000 MG capsule Take 2 g by mouth daily.  ? fluticasone (FLONASE) 50 MCG/ACT nasal spray SPRAY 2 SPRAYS INTO EACH NOSTRIL EVERY DAY  ? HORIZANT 300 MG TBCR Take 1 tablet by mouth at bedtime.  ? Multiple Vitamin (MULTIVITAMIN) tablet Take 1 tablet by mouth daily.  ? nortriptyline (PAMELOR) 50 MG capsule TAKE 1 CAPSULE BY MOUTH AT BEDTIME.  ? oxyCODONE-acetaminophen (PERCOCET/ROXICET) 5-325 MG tablet Take 1 tablet by mouth at bedtime.  ? Probiotic Product (PROBIOTIC PO) Take by mouth.  ? tiZANidine (ZANAFLEX) 2 MG tablet Take 2 tablets by mouth at bedtime. Takes as needed  ? ?Facility-Administered Encounter Medications as of 12/18/2021  ?Medication  ? 0.9 %  sodium chloride infusion  ? ? ?Allergies (verified) ?Aspirin and Codeine sulfate  ? ?History: ?Past Medical History:  ?Diagnosis Date  ? Adenomatous colon polyp 02/2008  ? AK (actinic keratosis)   ? Allergy   ? Basal cell carcinoma of face   ? Mohs procedure  ? [place]   ? Dyslipidemia 02/29/2016  ? Esophagitis 02/13/2001  ? Gastritis 02/13/2001  ? GERD (gastroesophageal reflux disease)   ? Hemorrhoids   ? Hiatal hernia   ? HNP (herniated nucleus pulposus), cervical 09/30/2016  ? With radiculopathy, s/p anterior discectomy (Vertell Limber 12/2016  ? Reactive depression (situational)   ? Renal stones   ? Smoker   ? ?Past Surgical History:  ?Procedure  Laterality Date  ? ANTERIOR CERVICAL DISCECTOMY  12/2016  ? C5/6 (Dr Vertell Limber)  ? COLONOSCOPY  02/14/03  ? polyps, ext hemorrhoids  ? COLONOSCOPY  2009  ? polyps, rec rpt 5 yrs  ? COLONOSCOPY  2015  ? 5 polyps, rec rpt 5 yrs [facility])  ? COLONOSCOPY  01/2019  ? multiple polyps including TAs, rpt 2-3 yrs [facility])  ? ESOPHAGOGASTRODUODENOSCOPY  02/14/03  ? esophagitis, gastritis, duodenitis without hemorrhage  ? ESOPHAGOGASTRODUODENOSCOPY  01/2019  ? chronic gastritis, no H pylori [facility])  ? MOHS  SURGERY    ? POLYPECTOMY    ? TONSILLECTOMY    ? ?Family History  ?Problem Relation Age of Onset  ? Hypertension Father   ? Gout Father   ? GER disease Father   ? Colon polyps Father   ? Breast cancer Mother   ? Colon polyps Brother   ? Glaucoma Brother   ?     early onset  ? Heart attack Paternal Grandfather   ? Transient ischemic attack Maternal Grandfather   ? Diabetes Neg Hx   ? Colon cancer Neg Hx   ? Esophageal cancer Neg Hx   ? Rectal cancer Neg Hx   ? Stomach cancer Neg Hx   ? ?Social History  ? ?Socioeconomic History  ? Marital status: Married  ?  Spouse name: Not on file  ? Number of children: 0  ? Years of education: Not on file  ? Highest education level: Not on file  ?Occupational History  ? 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 Smoking status: Every Day  ?  Packs/day: 0.75  ?  Years: 40.00  ?  Pack years: 30.00  ?  Types: Cigarettes  ? Smokeless tobacco: Never  ?Vaping Use  ? Vaping Use: Never used  ?Substance and Sexual Activity  ? Alcohol use: No  ? Drug use: No  ? Sexual activity: Not on file  ?Other Topics Concern  ? Not on file  ?Social History Narrative  ? Caffeine: 1 cup coffee/yda, Dr Malachi Bonds throughout the day  ? Lives with wife  ? Occupation: HVAC  ? Activity: work, Retail buyer, [place]  ? Diet: good water, fruits/vegetables daily  ? ?Social Determinants of Health  ? ?Financial Resource Strain: Low Risk   ? Difficulty of Paying Living Expenses: Not hard at all  ?Food Insecurity: No Food Insecurity  ? Worried About Charity fundraiser in the Last Year: Never true  ? Ran Out of Food in the Last Year: Never true  ?Transportation Needs: No Transportation Needs  ? Lack of Transportation (Medical): No  ? Lack of Transportation (Non-Medical): No  ?Physical Activity: Inactive  ? Days of Exercise per Week: 0 days  ? Minutes of Exercise per Session: 0 min  ?Stress: No Stress Concern Present  ? Feeling of Stress : Not at all  ?Social Connections: Not on file  ? ? ?Tobacco Counseling ?Ready to  quit: Yes ?Counseling given: Not Answered ? ? ?Clinical Intake: ? ?Pre-visit preparation completed: Yes ? ?Pain : No/denies pain ? ?  ? ?Nutritional Status: BMI of 19-24  Normal ?Nutritional Risks: None ?Diabetes: No ? ?How often do you need to have someone help you when you read instructions, pamphlets, or other written materials from your doctor or pharmacy?: 1 - Never ?What is the last grade level you completed in school?: 10th grade ? ?Diabetic? no ? ?Interpreter Needed?: No ? ?Information entered by :: NAllen LPN ? ? ?Activities of Daily  Living ? ?  12/18/2021  ?  2:16 PM  ?In your present state of health, do you have any difficulty performing the following activities:  ?Hearing? 0  ?Vision? 0  ?Difficulty concentrating or making decisions? 0  ?Walking or climbing stairs? 0  ?Dressing or bathing? 0  ?Doing errands, shopping? 0  ?Preparing Food and eating ? N  ?Using the Toilet? N  ?In the past six months, have you accidently leaked urine? N  ?Do you have problems with loss of bowel control? N  ?Managing your Medications? N  ?Managing your Finances? N  ?Housekeeping or managing your Housekeeping? N  ? ? ?Patient Care Team: ?Ria Bush, MD as PCP - General (Family Medicine) ? ?Indicate any recent Medical Services you may have received from other than [facility] providers in the past year (date may be approximate). ? ?   ?Assessment:  ? This is a routine wellness examination for Michael [name]. ? ?Hearing/Vision screen ?Vision Screening - Comments:: Regular eye exams, [facility] ? ?Dietary issues and exercise activities discussed: ?Current Exercise Habits: The patient does not participate in regular exercise at present ? ? Goals Addressed   ? ?  ?  ?  ?  ? This Visit's Progress  ?  Patient Stated     ?  12/18/2021, wants neck to get better ?  ? ?  ? ?Depression Screen ? ?  12/18/2021  ?  2:16 PM 12/17/2020  ? 12:05 PM 01/09/2019  ?  2:46 PM 07/11/2018  ?  2:44 PM  ?PHQ 2/9 Scores  ?PHQ - 2 Score 0 0 1 1  ?PHQ- 9  Score  0 3 2  ?  ?Fall Risk ? ?  12/18/2021  ?  2:16 PM 12/17/2020  ? 12:05 PM  ?Fall Risk   ?Falls in the past year? 0 0  ?Number falls in past yr: 0 0  ?Injury with Fall? 0 0  ?Risk for fall due to : Med

## 2021-12-18 NOTE — Patient Instructions (Signed)
[name] , ?Thank you for taking time to come for your Medicare Wellness Visit. I appreciate your ongoing commitment to your health goals. Please review the following plan we discussed and let me know if I can assist you in the future.  ? ?Screening recommendations/referrals: ?Colonoscopy: completed 03/09/2021, due 03/09/2024 ?Recommended yearly ophthalmology/optometry visit for glaucoma screening and checkup ?Recommended yearly dental visit for hygiene and checkup ? ?Vaccinations: ?Influenza vaccine: due 03/09/2022 ?Pneumococcal vaccine: n/a ?Tdap vaccine: completed 07/25/2014, due 07/25/2024 ?Shingles vaccine: discussed ?Covid-19: 11/27/2019, 11/02/2019 ? ?Advanced directives: Advance directive discussed with you today.  ? ?Conditions/risks identified: smoking ? ?Next appointment: Follow up in one year for your annual wellness visit ? ?Preventive Care 40-64 Years, Male ?Preventive care refers to lifestyle choices and visits with your health care provider that can promote health and wellness. ?What does preventive care include? ?A yearly physical exam. This is also called an annual well check. ?Dental exams once or twice a year. ?Routine eye exams. Ask your health care provider how often you should have your eyes checked. ?Personal lifestyle choices, including: ?Daily care of your teeth and gums. ?Regular physical activity. ?Eating a healthy diet. ?Avoiding tobacco and drug use. ?Limiting alcohol use. ?Practicing safe sex. ?Taking low-dose aspirin every day starting at age 64. ?What happens during an annual well check? ?The services and screenings done by your health care provider during your annual well check will depend on your age, overall health, lifestyle risk factors, and family history of disease. ?Counseling  ?Your health care provider may ask you questions about your: ?Alcohol use. ?Tobacco use. ?Drug use. ?Emotional well-being. ?Home and relationship well-being. ?Sexual activity. ?Eating habits. ?Work and work  Statistician. ?Screening  ?You may have the following tests or measurements: ?Height, weight, and BMI. ?Blood pressure. ?Lipid and cholesterol levels. These may be checked every 5 years, or more frequently if you are over 64 years old. ?Skin check. ?Lung cancer screening. You may have this screening every year starting at age 52 if you have a 30-pack-year history of smoking and currently smoke or have quit within the past 15 years. ?Fecal occult blood test (FOBT) of the stool. You may have this test every year starting at age 27. ?Flexible sigmoidoscopy or colonoscopy. You may have a sigmoidoscopy every 5 years or a colonoscopy every 10 years starting at age 25. ?Prostate cancer screening. Recommendations will vary depending on your family history and other risks. ?Hepatitis C blood test. ?Hepatitis B blood test. ?Sexually transmitted disease (STD) testing. ?Diabetes screening. This is done by checking your blood sugar (glucose) after you have not eaten for a while (fasting). You may have this done every 1-3 years. ?Discuss your test results, treatment options, and if necessary, the need for more tests with your health care provider. ?Vaccines  ?Your health care provider may recommend certain vaccines, such as: ?Influenza vaccine. This is recommended every year. ?Tetanus, diphtheria, and acellular pertussis (Tdap, Td) vaccine. You may need a Td booster every 10 years. ?Zoster vaccine. You may need this after age 63. ?Pneumococcal 13-valent conjugate (PCV13) vaccine. You may need this if you have certain conditions and have not been vaccinated. ?Pneumococcal polysaccharide (PPSV23) vaccine. You may need one or two doses if you smoke cigarettes or if you have certain conditions. ?Talk to your health care provider about which screenings and vaccines you need and how often you need them. ?This information is not intended to replace advice given to you by your health care provider. Make sure you  discuss any questions you  have with your health care provider. ?Document Released: 08/22/2015 Document Revised: 04/14/2016 Document Reviewed: 05/27/2015 ?Elsevier Interactive Patient Education ? 2017 [place]. ? ?Fall Prevention in the Home ?Falls can cause injuries. They can happen to people of all ages. There are many things you can do to make your home safe and to help prevent falls. ?What can I do on the outside of my home? ?Regularly fix the edges of walkways and driveways and fix any cracks. ?Remove anything that might make you trip as you walk through a door, such as a raised step or threshold. ?Trim any bushes or trees on the path to your home. ?Use bright outdoor lighting. ?Clear any walking paths of anything that might make someone trip, such as rocks or tools. ?Regularly check to see if handrails are loose or broken. Make sure that both sides of any steps have handrails. ?Any raised decks and porches should have guardrails on the edges. ?Have any leaves, snow, or ice cleared regularly. ?Use sand or salt on walking paths during winter. ?Clean up any spills in your garage right away. This includes oil or grease spills. ?What can I do in the bathroom? ?Use night lights. ?Install grab bars by the toilet and in the tub and shower. Do not use towel bars as grab bars. ?Use non-skid mats or decals in the tub or shower. ?If you need to sit down in the shower, use a plastic, non-slip stool. ?Keep the floor dry. Clean up any water that spills on the floor as soon as it happens. ?Remove soap buildup in the tub or shower regularly. ?Attach bath mats securely with double-sided non-slip rug tape. ?Do not have throw rugs and other things on the floor that can make you trip. ?What can I do in the bedroom? ?Use night lights. ?Make sure that you have a light by your bed that is easy to reach. ?Do not use any sheets or blankets that are too big for your bed. They should not hang down onto the floor. ?Have a firm chair that has side arms. You can  use this for support while you get dressed. ?Do not have throw rugs and other things on the floor that can make you trip. ?What can I do in the kitchen? ?Clean up any spills right away. ?Avoid walking on wet floors. ?Keep items that you use a lot in easy-to-reach places. ?If you need to reach something above you, use a strong step stool that has a grab bar. ?Keep electrical cords out of the way. ?Do not use floor polish or wax that makes floors slippery. If you must use wax, use non-skid floor wax. ?Do not have throw rugs and other things on the floor that can make you trip. ?What can I do with my stairs? ?Do not leave any items on the stairs. ?Make sure that there are handrails on both sides of the stairs and use them. Fix handrails that are broken or loose. Make sure that handrails are as long as the stairways. ?Check any carpeting to make sure that it is firmly attached to the stairs. Fix any carpet that is loose or worn. ?Avoid having throw rugs at the top or bottom of the stairs. If you do have throw rugs, attach them to the floor with carpet tape. ?Make sure that you have a light switch at the top of the stairs and the bottom of the stairs. If you do not have them, ask someone to  add them for you. ?What else can I do to help prevent falls? ?Wear shoes that: ?Do not have high heels. ?Have rubber bottoms. ?Are comfortable and fit you well. ?Are closed at the toe. Do not wear sandals. ?If you use a stepladder: ?Make sure that it is fully opened. Do not climb a closed stepladder. ?Make sure that both sides of the stepladder are locked into place. ?Ask someone to hold it for you, if possible. ?Clearly mark and make sure that you can see: ?Any grab bars or handrails. ?First and last steps. ?Where the edge of each step is. ?Use tools that help you move around (mobility aids) if they are needed. These include: ?Canes. ?Walkers. ?Scooters. ?Crutches. ?Turn on the lights when you go into a dark area. Replace any light  bulbs as soon as they burn out. ?Set up your furniture so you have a clear path. Avoid moving your furniture around. ?If any of your floors are uneven, fix them. ?If there are any pets around you, be aw

## 2021-12-20 ENCOUNTER — Other Ambulatory Visit: Payer: Self-pay | Admitting: Family Medicine

## 2021-12-20 DIAGNOSIS — E785 Hyperlipidemia, unspecified: Secondary | ICD-10-CM

## 2021-12-20 DIAGNOSIS — N401 Enlarged prostate with lower urinary tract symptoms: Secondary | ICD-10-CM

## 2021-12-22 ENCOUNTER — Other Ambulatory Visit (INDEPENDENT_AMBULATORY_CARE_PROVIDER_SITE_OTHER): Payer: Medicare PPO

## 2021-12-22 DIAGNOSIS — R3912 Poor urinary stream: Secondary | ICD-10-CM | POA: Diagnosis not present

## 2021-12-22 DIAGNOSIS — E785 Hyperlipidemia, unspecified: Secondary | ICD-10-CM

## 2021-12-22 DIAGNOSIS — N401 Enlarged prostate with lower urinary tract symptoms: Secondary | ICD-10-CM

## 2021-12-22 LAB — COMPREHENSIVE METABOLIC PANEL
ALT: 18 U/L (ref 0–53)
AST: 14 U/L (ref 0–37)
Albumin: 4.3 g/dL (ref 3.5–5.2)
Alkaline Phosphatase: 109 U/L (ref 39–117)
BUN: 9 mg/dL (ref 6–23)
CO2: 27 mEq/L (ref 19–32)
Calcium: 9.7 mg/dL (ref 8.4–10.5)
Chloride: 103 mEq/L (ref 96–112)
Creatinine, Ser: 0.9 mg/dL (ref 0.40–1.50)
GFR: 91.37 mL/min (ref >60.00)
Glucose, Bld: 92 mg/dL (ref 70–99)
Potassium: 3.9 mEq/L (ref 3.5–5.1)
Sodium: 139 mEq/L (ref 135–145)
Total Bilirubin: 0.5 mg/dL (ref 0.2–1.2)
Total Protein: 6.8 g/dL (ref 6.0–8.3)

## 2021-12-22 LAB — LIPID PANEL
Cholesterol: 200 mg/dL (ref 0–200)
HDL: 47 mg/dL (ref >39.00)
NonHDL: 153.43
Total CHOL/HDL Ratio: 4
Triglycerides: 284 mg/dL — ABNORMAL HIGH (ref 0.0–149.0)
VLDL: 56.8 mg/dL — ABNORMAL HIGH (ref 0.0–40.0)

## 2021-12-22 LAB — PSA: PSA: 0.2 ng/mL (ref 0.10–4.00)

## 2021-12-22 LAB — LDL CHOLESTEROL, DIRECT: Direct LDL: 113 mg/dL

## 2021-12-29 ENCOUNTER — Ambulatory Visit (INDEPENDENT_AMBULATORY_CARE_PROVIDER_SITE_OTHER): Payer: Medicare PPO | Admitting: Family Medicine

## 2021-12-29 ENCOUNTER — Encounter: Payer: Self-pay | Admitting: Family Medicine

## 2021-12-29 ENCOUNTER — Other Ambulatory Visit: Payer: Self-pay | Admitting: Family Medicine

## 2021-12-29 VITALS — BP 134/82 | HR 102 | Temp 98.0°F | Ht 67.5 in | Wt 150.4 lb

## 2021-12-29 DIAGNOSIS — F17200 Nicotine dependence, unspecified, uncomplicated: Secondary | ICD-10-CM

## 2021-12-29 DIAGNOSIS — Z0000 Encounter for general adult medical examination without abnormal findings: Secondary | ICD-10-CM | POA: Diagnosis not present

## 2021-12-29 DIAGNOSIS — K2100 Gastro-esophageal reflux disease with esophagitis, without bleeding: Secondary | ICD-10-CM

## 2021-12-29 DIAGNOSIS — H6123 Impacted cerumen, bilateral: Secondary | ICD-10-CM

## 2021-12-29 DIAGNOSIS — G4700 Insomnia, unspecified: Secondary | ICD-10-CM | POA: Diagnosis not present

## 2021-12-29 DIAGNOSIS — Z7189 Other specified counseling: Secondary | ICD-10-CM

## 2021-12-29 DIAGNOSIS — E785 Hyperlipidemia, unspecified: Secondary | ICD-10-CM

## 2021-12-29 DIAGNOSIS — R918 Other nonspecific abnormal finding of lung field: Secondary | ICD-10-CM

## 2021-12-29 DIAGNOSIS — J439 Emphysema, unspecified: Secondary | ICD-10-CM | POA: Diagnosis not present

## 2021-12-29 MED ORDER — NICOTINE 10 MG IN INHA: 1.0000 | RESPIRATORY_TRACT | 1 refills | Status: DC | PRN

## 2021-12-29 MED ORDER — NORTRIPTYLINE HCL 50 MG PO CAPS: 50.0000 mg | ORAL_CAPSULE | Freq: Every day | ORAL | 3 refills | Status: DC

## 2021-12-29 MED ORDER — FLUTICASONE PROPIONATE 50 MCG/ACT NA SUSP
NASAL | 1 refills | Status: DC
Start: 2021-12-29 — End: 2022-07-16

## 2021-12-29 NOTE — Assessment & Plan Note (Signed)
Preventative protocols reviewed and updated unless pt declined. Discussed healthy diet and lifestyle.  

## 2021-12-29 NOTE — Assessment & Plan Note (Addendum)
Chronic off medications. Reviewed diet choices to improve cholesterol levels. Discussed ASCVD risk, reviewed indication for statin. He will research Lipitor '20mg'$  and let me know if interested in trying this medication.  The 10-year ASCVD risk score (Arnett DK, et al., 2019) is: 16.9%   Values used to calculate the score:     Age: 61 years     Sex: Male     Is Non-Hispanic African American: No     Diabetic: No     Tobacco smoker: Yes     Systolic Blood Pressure: 216 mmHg     Is BP treated: No     HDL Cholesterol: 47 mg/dL     Total Cholesterol: 200 mg/dL

## 2021-12-29 NOTE — Assessment & Plan Note (Signed)
Overdue for lung cancer screening CT-number provided to call and schedule.

## 2021-12-29 NOTE — Assessment & Plan Note (Signed)
Encouraged full cessation.  Discussed nicotine replacement therapy.  I will send in a Rx for Nicotrol inhaler for him to price out and try. He is due for lung cancer screening CT, number provided to [place] [facility] to call and inquire about scheduling.

## 2021-12-29 NOTE — Progress Notes (Signed)
Patient ID: Michael [name], male    DOB: 01-14-1961, 61 y.o.   MRN: [id number]  This visit was conducted in person.  BP 134/82   Pulse (!) 102   Temp 98 F (36.7 C) (Temporal)   Ht 5' 7.5" (1.715 m)   Wt 150 lb 6 oz (68.2 kg)   SpO2 97%   BMI 23.20 kg/m    CC: CPE Subjective:   HPI: Michael [name] is a 61 y.o. male presenting on 12/29/2021 for Annual Exam (MCR prt 2. )   Saw health advisor earlier this month for medicare wellness visit. Note reviewed.   No results found.  Flowsheet Row Clinical Support from 12/18/2021 in [facility]  PHQ-2 Total Score 0          12/18/2021    2:16 PM 12/17/2020   12:05 PM  [place] in the past year? 0 0  Number falls in past yr: 0 0  Injury with Fall? 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 Takes oxycodone and nortriptyline at bedtime. Received SS disability - started 12/08/2019. Now sees Dr Elwin Sleight, sees pain management Jeral Pinch, Simeon Craft) Q88mowith steroid injections, takes oxycodone 5/'325mg'$  nightly.   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 No BPH symptoms.  Lung cancer screening - undergoing, started 12/2019, latest 12/2020, due for rpt.  Flu shot yearly [place], 11/2019, no booster Tdap 2015 Shingrix - discussed, to check with pharmacy  Advanced directive: discussed. Doesn't have advanced directive. Would want wife JRemo Lippsto be HCPOA. Advanced directive provided today.  Seat belt use discussed Sunscreen use discussed. No changing moles on skin.  Smoking - 1 ppd, 40+ PY hx.   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 Interim medical history since our last visit reviewed. Allergies and medications reviewed and updated. Outpatient Medications Prior to Visit  Medication Sig Dispense Refill   Bioflavonoid Products (ESTER C PO) Take by mouth daily.     Cholecalciferol (VITAMIN D3 PO) Take by mouth.     cyanocobalamin 100 MCG tablet Take 100 mcg by mouth daily.      dexlansoprazole (DEXILANT) 60 MG capsule Take 1 capsule (60 mg total) by mouth daily. 90 capsule 1   fish oil-omega-3 fatty acids 1000 MG capsule Take 2 g by mouth daily.     HORIZANT 300 MG TBCR Take 1 tablet by mouth at bedtime.     Multiple Vitamin (MULTIVITAMIN) tablet Take 1 tablet by mouth daily.     oxyCODONE-acetaminophen (PERCOCET/ROXICET) 5-325 MG tablet Take 1 tablet by mouth at bedtime.     Probiotic Product (PROBIOTIC PO) Take by mouth.     tiZANidine (ZANAFLEX) 2 MG tablet Take  2 tablets by mouth at bedtime. Takes as needed     fluticasone (FLONASE) 50 MCG/ACT nasal spray SPRAY 2 SPRAYS INTO EACH NOSTRIL EVERY DAY 48 mL 1   nortriptyline (PAMELOR) 50 MG capsule TAKE 1 CAPSULE BY MOUTH AT BEDTIME. 90 capsule 0   Facility-Administered Medications Prior to Visit  Medication Dose Route Frequency Provider Last Rate Last Admin   0.9 %  sodium chloride infusion  500 mL Intravenous Once Ladene Artist, MD         Per HPI unless specifically indicated in ROS section below Review of Systems  Constitutional:  Negative for activity change, appetite change, chills, fatigue, fever and unexpected weight  change.  HENT:  Negative for hearing loss.   Eyes:  Negative for visual disturbance.  Respiratory:  Positive for shortness of breath (with heavy lifting). Negative for cough, chest tightness and wheezing.   Cardiovascular:  Negative for chest pain, palpitations and leg swelling.  Gastrointestinal:  Negative for abdominal distention, abdominal pain, blood in stool, constipation, diarrhea, nausea and vomiting.  Genitourinary:  Negative for difficulty urinating and hematuria.  Musculoskeletal:  Negative for arthralgias, myalgias and neck pain.  Skin:  Negative for rash.  Neurological:  Negative for dizziness, seizures, syncope and headaches.  Hematological:  Negative for adenopathy. Does not bruise/bleed easily.  Psychiatric/Behavioral:  Negative for dysphoric mood. The patient is not nervous/anxious.    Objective:  BP 134/82   Pulse (!) 102   Temp 98 F (36.7 C) (Temporal)   Ht 5' 7.5" (1.715 m)   Wt 150 lb 6 oz (68.2 kg)   SpO2 97%   BMI 23.20 kg/m   Wt Readings from Last 3 Encounters:  12/29/21 150 lb 6 oz (68.2 kg)  12/18/21 154 lb (69.9 kg)  03/09/21 152 lb (68.9 kg)      Physical Exam Vitals and nursing note reviewed.  Constitutional:      General: He is not in acute distress.    Appearance: Normal appearance. He is well-developed. He is not ill-appearing.  HENT:     Head: Normocephalic and atraumatic.     Right Ear: Tympanic membrane, ear canal and external ear normal. Decreased hearing noted. There is impacted cerumen.     Left Ear: Hearing, tympanic membrane, ear canal and external ear normal. There is impacted cerumen.     Mouth/Throat:     Mouth: Mucous membranes are moist.     Pharynx: Oropharynx is clear. No oropharyngeal exudate or posterior oropharyngeal erythema.  Eyes:     General: No scleral icterus.    Extraocular Movements: Extraocular movements intact.     Conjunctiva/sclera: Conjunctivae normal.     Pupils: Pupils are equal, round, and reactive to  light.  Neck:     Thyroid: No thyroid mass or thyromegaly.     Vascular: No carotid bruit.  Cardiovascular:     Rate and Rhythm: Normal rate and regular rhythm.     Pulses: Normal pulses.          Radial pulses are 2+ on the right side and 2+ on the left side.     Heart sounds: Normal heart sounds. No murmur heard. Pulmonary:     Effort: Pulmonary effort is normal. No respiratory distress.     Breath sounds: Normal breath sounds. No wheezing, rhonchi or rales.  Abdominal:     General: Bowel sounds are normal. There is no distension.     Palpations: Abdomen is soft. There is no mass.  Tenderness: There is no abdominal tenderness. There is no guarding or rebound.     Hernia: No hernia is present.  Musculoskeletal:        General: Normal range of motion.     Cervical back: Normal range of motion and neck supple.     Right lower leg: No edema.     Left lower leg: No edema.  Lymphadenopathy:     Cervical: No cervical adenopathy.  Skin:    General: Skin is warm and dry.     Findings: No rash.  Neurological:     General: No focal deficit present.     Mental Status: He is alert and oriented to person, place, and time.  Psychiatric:        Mood and Affect: Mood normal.        Behavior: Behavior normal.        Thought Content: Thought content normal.        Judgment: Judgment normal.      Results for orders placed or performed in visit on 12/22/21  PSA  Result Value Ref Range   PSA 0.20 0.10 - 4.00 ng/mL  Lipid panel  Result Value Ref Range   Cholesterol 200 0 - 200 mg/dL   Triglycerides 284.0 (H) 0.0 - 149.0 mg/dL   HDL 47.00 >39.00 mg/dL   VLDL 56.8 (H) 0.0 - 40.0 mg/dL   Total CHOL/HDL Ratio 4    NonHDL 153.43   Comprehensive metabolic panel  Result Value Ref Range   Sodium 139 135 - 145 mEq/L   Potassium 3.9 3.5 - 5.1 mEq/L   Chloride 103 96 - 112 mEq/L   CO2 27 19 - 32 mEq/L   Glucose, Bld 92 70 - 99 mg/dL   BUN 9 6 - 23 mg/dL   Creatinine, Ser 0.90 0.40 - 1.50  mg/dL   Total Bilirubin 0.5 0.2 - 1.2 mg/dL   Alkaline Phosphatase 109 39 - 117 U/L   AST 14 0 - 37 U/L   ALT 18 0 - 53 U/L   Total Protein 6.8 6.0 - 8.3 g/dL   Albumin 4.3 3.5 - 5.2 g/dL   GFR 91.37 >60.00 mL/min   Calcium 9.7 8.4 - 10.5 mg/dL  LDL cholesterol, direct  Result Value Ref Range   Direct LDL 113.0 mg/dL    Assessment & Plan:   Problem List Items Addressed This Visit     Healthcare maintenance - Primary (Chronic)    Preventative protocols reviewed and updated unless pt declined. Discussed healthy diet and lifestyle.        Advanced directives, counseling/discussion (Chronic)    Advanced directive: discussed. Doesn't have advanced directive. Would want wife [name] to be HCPOA. Advanced directive provided today.       TOBACCO ABUSE    Encouraged full cessation.  Discussed nicotine replacement therapy.  I will send in a Rx for Nicotrol inhaler for him to price out and try. He is due for lung cancer screening CT, number provided to [facility] to call and inquire about scheduling.       GERD    Chronic, stable on Dexilant-continue this.       INSOMNIA    Stable period on nightly nortriptyline.       Hearing loss due to cerumen impaction    Cerumen disimpaction performed today with ear canal irrigation, patient tolerated well.  He noted improvement in hearing after irrigation.       Dyslipidemia    Chronic off medications.  Reviewed diet choices to improve cholesterol levels. Discussed ASCVD risk, reviewed indication for statin. He will research Lipitor '20mg'$  and let me know if interested in trying this medication.  The 10-year ASCVD risk score (Arnett DK, et al., 2019) is: 16.9%   Values used to calculate the score:     Age: 32 years     Sex: Male     Is Non-Hispanic African American: No     Diabetic: No     Tobacco smoker: Yes     Systolic Blood Pressure: 675 mmHg     Is BP treated: No     HDL Cholesterol: 47 mg/dL     Total Cholesterol: 200  mg/dL        Pulmonary nodules    Overdue for lung cancer screening CT-number provided to call and schedule.       Emphysema of lung ([place])    Encouraged full smoking cessation.  Overall asymptomatic from respiratory standpoint       Relevant Medications   fluticasone (FLONASE) 50 MCG/ACT nasal spray   nicotine (NICOTROL) 10 MG inhaler     Meds ordered this encounter  Medications   nortriptyline (PAMELOR) 50 MG capsule    Sig: Take 1 capsule (50 mg total) by mouth at bedtime.    Dispense:  90 capsule    Refill:  3   fluticasone (FLONASE) 50 MCG/ACT nasal spray    Sig: SPRAY 2 SPRAYS INTO EACH NOSTRIL EVERY DAY    Dispense:  48 mL    Refill:  1   nicotine (NICOTROL) 10 MG inhaler    Sig: Inhale 1 Cartridge (1 continuous puffing total) into the lungs as needed for smoking cessation.    Dispense:  36 each    Refill:  1   No orders of the defined types were placed in this encounter.    Patient instructions: Call [facility] at [phone] to ask about repeat lung cancer screening program as you're due. If interested, check with pharmacy about new 2 shot shingles series (shingrix).  Price out nicotrol nicotine inhaler sent to your pharmacy.  Good to see you today Return as needed or in 1 year for next physical/medicare wellness visit.   Follow up plan: Return in about 1 year (around 12/30/2022), or if symptoms worsen or fail to improve, for medicare wellness visit, annual exam, prior fasting for blood work.  Ria Bush, MD

## 2021-12-29 NOTE — Patient Instructions (Addendum)
Call [facility] at [phone] to ask about repeat lung cancer screening program as you're due. If interested, check with pharmacy about new 2 shot shingles series (shingrix).  Price out nicotrol nicotine inhaler sent to your pharmacy.  Good to see you today Return as needed or in 1 year for next physical/medicare wellness visit.   Health Maintenance, Male Adopting a healthy lifestyle and getting preventive care are important in promoting health and wellness. Ask your health care provider about: The right schedule for you to have regular tests and exams. Things you can do on your own to prevent diseases and keep yourself healthy. What should I know about diet, weight, and exercise? Eat a healthy diet  Eat a diet that includes plenty of vegetables, fruits, low-fat dairy products, and lean protein. Do not eat a lot of foods that are high in solid fats, added sugars, or sodium. Maintain a healthy weight Body mass index (BMI) is a measurement that can be used to identify possible weight problems. It estimates body fat based on height and weight. Your health care provider can help determine your BMI and help you achieve or maintain a healthy weight. Get regular exercise Get regular exercise. This is one of the most important things you can do for your health. Most adults should: Exercise for at least 150 minutes each week. The exercise should increase your heart rate and make you sweat (moderate-intensity exercise). Do strengthening exercises at least twice a week. This is in addition to the moderate-intensity exercise. Spend less time sitting. Even light physical activity can be beneficial. Watch cholesterol and blood lipids Have your blood tested for lipids and cholesterol at 63 years of age, then have this test every 5 years. You may need to have your cholesterol levels checked more often if: Your lipid or cholesterol levels are high. You are older than 63 years of age. You are at  high risk for heart disease. What should I know about cancer screening? Many types of cancers can be detected early and may often be prevented. Depending on your health history and family history, you may need to have cancer screening at various ages. This may include screening for: Colorectal cancer. Prostate cancer. Skin cancer. Lung cancer. What should I know about heart disease, diabetes, and high blood pressure? Blood pressure and heart disease High blood pressure causes heart disease and increases the risk of stroke. This is more likely to develop in people who have high blood pressure readings or are overweight. Talk with your health care provider about your target blood pressure readings. Have your blood pressure checked: Every 3-5 years if you are 48-47 years of age. Every year if you are 11 years old or older. If you are between the ages of 17 and 17 and are a current or former smoker, ask your health care provider if you should have a one-time screening for abdominal aortic aneurysm (AAA). Diabetes Have regular diabetes screenings. This checks your fasting blood sugar level. Have the screening done: Once every three years after age 47 if you are at a normal weight and have a low risk for diabetes. More often and at a younger age if you are overweight or have a high risk for diabetes. What should I know about preventing infection? Hepatitis B If you have a higher risk for hepatitis B, you should be screened for this virus. Talk with your health care provider to find out if you are at risk for hepatitis B infection. Hepatitis  C Blood testing is recommended for: Everyone born from [birth year] through 1967. Anyone with known risk factors for hepatitis C. Sexually transmitted infections (STIs) You should be screened each year for STIs, including gonorrhea and chlamydia, if: You are sexually active and are younger than 63 years of age. You are older than 63 years of age and your health  care provider tells you that you are at risk for this type of infection. Your sexual activity has changed since you were last screened, and you are at increased risk for chlamydia or gonorrhea. Ask your health care provider if you are at risk. Ask your health care provider about whether you are at high risk for HIV. Your health care provider may recommend a prescription medicine to help prevent HIV infection. If you choose to take medicine to prevent HIV, you should first get tested for HIV. You should then be tested every 3 months for as long as you are taking the medicine. Follow these instructions at home: Alcohol use Do not drink alcohol if your health care provider tells you not to drink. If you drink alcohol: Limit how much you have to 0-2 drinks a day. Know how much alcohol is in your drink. In the [place] one drink equals one 12 oz bottle of beer (355 mL), one 5 oz glass of wine (148 mL), or one 1 oz glass of hard liquor (44 mL). Lifestyle Do not use any products that contain nicotine or tobacco. These products include cigarettes, chewing tobacco, and vaping devices, such as e-cigarettes. If you need help quitting, ask your health care provider. Do not use street drugs. Do not share needles. Ask your health care provider for help if you need support or information about quitting drugs. General instructions Schedule regular health, dental, and eye exams. Stay current with your vaccines. Tell your health care provider if: You often feel depressed. You have ever been abused or do not feel safe at home. Summary Adopting a healthy lifestyle and getting preventive care are important in promoting health and wellness. Follow your health care provider's instructions about healthy diet, exercising, and getting tested or screened for diseases. Follow your health care provider's instructions on monitoring your cholesterol and blood pressure. This information is not intended to replace advice given  to you by your health care provider. Make sure you discuss any questions you have with your health care provider. Document Revised: 12/15/2020 Document Reviewed: 12/15/2020 Elsevier Patient Education  [place].

## 2021-12-29 NOTE — Assessment & Plan Note (Signed)
Cerumen disimpaction performed today with ear canal irrigation, patient tolerated well.  He noted improvement in hearing after irrigation.

## 2021-12-29 NOTE — Assessment & Plan Note (Signed)
Encouraged full smoking cessation.  Overall asymptomatic from respiratory standpoint

## 2021-12-29 NOTE — Assessment & Plan Note (Signed)
Advanced directive: discussed. Doesn't have advanced directive. Would want wife [name] to be HCPOA. Advanced directive provided today.

## 2021-12-29 NOTE — Assessment & Plan Note (Signed)
Stable period on nightly nortriptyline.

## 2021-12-29 NOTE — Assessment & Plan Note (Signed)
Chronic, stable on Dexilant-continue this.

## 2022-01-08 ENCOUNTER — Other Ambulatory Visit: Payer: Self-pay | Admitting: *Deleted

## 2022-01-08 DIAGNOSIS — Z87891 Personal history of nicotine dependence: Secondary | ICD-10-CM

## 2022-01-08 DIAGNOSIS — F17210 Nicotine dependence, cigarettes, uncomplicated: Secondary | ICD-10-CM

## 2022-01-08 DIAGNOSIS — Z122 Encounter for screening for malignant neoplasm of respiratory organs: Secondary | ICD-10-CM

## 2022-01-19 ENCOUNTER — Ambulatory Visit: Payer: Medicare PPO

## 2022-01-26 ENCOUNTER — Ambulatory Visit
Admission: RE | Admit: 2022-01-26 | Discharge: 2022-01-26 | Disposition: A | Discharge status: Home / Self Care | Payer: Medicare PPO | Source: Ambulatory Visit | Attending: Acute Care | Admitting: Acute Care

## 2022-01-26 DIAGNOSIS — Z87891 Personal history of nicotine dependence: Secondary | ICD-10-CM | POA: Diagnosis not present

## 2022-01-26 DIAGNOSIS — Z122 Encounter for screening for malignant neoplasm of respiratory organs: Secondary | ICD-10-CM | POA: Diagnosis not present

## 2022-01-26 DIAGNOSIS — F17210 Nicotine dependence, cigarettes, uncomplicated: Secondary | ICD-10-CM | POA: Insufficient documentation

## 2022-01-28 ENCOUNTER — Telehealth: Payer: Self-pay | Admitting: *Deleted

## 2022-01-28 DIAGNOSIS — Z87891 Personal history of nicotine dependence: Secondary | ICD-10-CM

## 2022-01-28 DIAGNOSIS — R911 Solitary pulmonary nodule: Secondary | ICD-10-CM

## 2022-01-28 DIAGNOSIS — F17210 Nicotine dependence, cigarettes, uncomplicated: Secondary | ICD-10-CM

## 2022-01-28 NOTE — Telephone Encounter (Signed)
Spoke with pt's wife [name] [facility]) and reviewed lung screening CT results. I advised that there was one new nodule noted that is very small in size (4.40m) and that the other nodules that were listed were stable compared to last CT per radiologist. She had concerns about the emphysema being listed as severe. I explained that a better measure of emphysema would be a PFT if pt is having issues with SOB. I advised her to have pt discuss this with his PCP. I advised that we will plan to repeat his CT in 6 months to evaluate new and former nodules. Results faxed to PCP. Order placed for 6 mth nodule f/u low dose CT. I explained that I would have SEric Form NP review the CT on 01/29/22 when she is back in the office and if she has any other recommendations I would call her back and let her know. She verbalized understanding and had no further questions.  Sarah, please review results and let me know if you have anything to add.

## 2022-01-28 NOTE — Telephone Encounter (Signed)
Routing to lung nodule pool.  

## 2022-01-28 NOTE — Telephone Encounter (Signed)
Patient's wife would like a call from the nurse or doctor to go over the husband's CT scan of the lungs which he got on Tuesday, 6/20.  She stated that she does not understand the results.  CB# [phone].

## 2022-02-05 DIAGNOSIS — L538 Other specified erythematous conditions: Secondary | ICD-10-CM | POA: Diagnosis not present

## 2022-02-05 DIAGNOSIS — Z789 Other specified health status: Secondary | ICD-10-CM | POA: Diagnosis not present

## 2022-02-05 DIAGNOSIS — R208 Other disturbances of skin sensation: Secondary | ICD-10-CM | POA: Diagnosis not present

## 2022-02-05 DIAGNOSIS — L723 Sebaceous cyst: Secondary | ICD-10-CM | POA: Diagnosis not present

## 2022-02-05 DIAGNOSIS — D485 Neoplasm of uncertain behavior of skin: Secondary | ICD-10-CM | POA: Diagnosis not present

## 2022-02-20 ENCOUNTER — Other Ambulatory Visit: Payer: Self-pay | Admitting: Family Medicine

## 2022-02-23 ENCOUNTER — Encounter: Payer: Self-pay | Admitting: Family Medicine

## 2022-02-23 ENCOUNTER — Telehealth (INDEPENDENT_AMBULATORY_CARE_PROVIDER_SITE_OTHER): Payer: Medicare PPO | Admitting: Family Medicine

## 2022-02-23 VITALS — BP 112/82 | HR 92 | Temp 97.3°F | Ht 67.5 in | Wt 160.0 lb

## 2022-02-23 DIAGNOSIS — F17200 Nicotine dependence, unspecified, uncomplicated: Secondary | ICD-10-CM | POA: Diagnosis not present

## 2022-02-23 DIAGNOSIS — E785 Hyperlipidemia, unspecified: Secondary | ICD-10-CM

## 2022-02-23 NOTE — Assessment & Plan Note (Addendum)
Continue to encourage smoking cessation as best option to decrease cardiovascular risk. He is currently on half to 1 pack a day, notes improvement when he uses Nicotrol inhaler.

## 2022-02-23 NOTE — Assessment & Plan Note (Addendum)
Reviewed recent FLP which showed significantly elevated triglycerides, however he notes that he had recently received a cervical epidural steroid injection and wonders if that contributed to the elevated triglycerides.  Reasonable to recheck in a month that he does not receive a steroid injection- I asked him to call and schedule fasting lab visit for next month.   No strong family history of strokes or heart attacks, recent chest CT did not have obvious coronary artery or aortic atherosclerotic disease.  We will also check lipoprotein a and apolipoprotein B to further help risk stratify.  Patient and wife agree with plan.

## 2022-02-23 NOTE — Progress Notes (Addendum)
Patient ID: Michael [name], male    DOB: [birth year], 61 y.o.   MRN: [id number]  Virtual visit completed through [employer], a video enabled telemedicine application. Due to national recommendations of social distancing due to COVID-19, a virtual visit is felt to be most appropriate for this patient at this time. Reviewed limitations, risks, security and privacy concerns of performing a virtual visit and the availability of in person appointments. I also reviewed that there may be a patient responsible charge related to this service. The patient agreed to proceed.   Patient location: home Provider location: [place] at [facility], office Persons participating in this virtual visit: patient, provider   If any vitals were documented, they were collected by patient at home unless specified below.    BP 112/82   Pulse 92   Temp (!) 97.3 F (36.3 C)   Ht 5' 7.5" (1.715 m)   Wt 160 lb (72.6 kg)   BMI 24.69 kg/m    CC: discuss cholesterol medication Subjective:   HPI: Michael [name] is a 61 y.o. male presenting on 02/23/2022 for Discuss Medication (Wants to discuss starting chol med. )   We discussed lipid levels as well as his personal ASCVD risk and option of atorvastatin at recent CPE - he would like to further discuss statin medication.   Labs were checked right after steroid injection into the neck.   Smoking - continues smoking 1/2-1 ppd.   Lab Results  Component Value Date   CHOL 200 12/22/2021   HDL 47.00 12/22/2021   LDLCALC 103 (H) 12/17/2020   LDLDIRECT 113.0 12/22/2021   TRIG 284.0 (H) 12/22/2021   CHOLHDL 4 12/22/2021   The 10-year ASCVD risk score (Arnett DK, et al., 2019) is: 12.6%   Values used to calculate the score:     Age: 11 years     Sex: Male     Is Non-Hispanic African American: No     Diabetic: No     Tobacco smoker: Yes     Systolic Blood Pressure: 093 mmHg     Is BP treated: No     HDL Cholesterol: 47 mg/dL     Total Cholesterol: 200 mg/dL   P  grandfather with massive MI at age 32s. M grandfather with TIA age 96s.     Relevant past medical, surgical, family and social history reviewed and updated as indicated. Interim medical history since our last visit reviewed. Allergies and medications reviewed and updated. Outpatient Medications Prior to Visit  Medication Sig Dispense Refill   Bioflavonoid Products (ESTER C PO) Take by mouth daily.     Cholecalciferol (VITAMIN D3 PO) Take by mouth.     cyanocobalamin 100 MCG tablet Take 100 mcg by mouth daily.      dexlansoprazole (DEXILANT) 60 MG capsule TAKE 1 CAPSULE BY MOUTH EVERY DAY 90 capsule 3   fish oil-omega-3 fatty acids 1000 MG capsule Take 2 g by mouth daily.     fluticasone (FLONASE) 50 MCG/ACT nasal spray SPRAY 2 SPRAYS INTO EACH NOSTRIL EVERY DAY 48 mL 1   HORIZANT 300 MG TBCR Take 1 tablet by mouth at bedtime.     Multiple Vitamin (MULTIVITAMIN) tablet Take 1 tablet by mouth daily.     NICOTROL 10 MG inhaler INHALE 1 CARTRIDGE (1 CONTINUOUS PUFFING TOTAL) INTO THE LUNGS AS NEEDED FOR SMOKING CESSATION. 168 each 0   nortriptyline (PAMELOR) 50 MG capsule Take 1 capsule (50 mg total) by mouth at bedtime. 90 capsule  3   oxyCODONE-acetaminophen (PERCOCET/ROXICET) 5-325 MG tablet Take 1 tablet by mouth at bedtime.     Probiotic Product (PROBIOTIC PO) Take by mouth.     tiZANidine (ZANAFLEX) 2 MG tablet Take 2 tablets by mouth at bedtime. Takes as needed     Facility-Administered Medications Prior to Visit  Medication Dose Route Frequency Provider Last Rate Last Admin   0.9 %  sodium chloride infusion  500 mL Intravenous Once Ladene Artist, MD         Per HPI unless specifically indicated in ROS section below Review of Systems Objective:  BP 112/82   Pulse 92   Temp (!) 97.3 F (36.3 C)   Ht 5' 7.5" (1.715 m)   Wt 160 lb (72.6 kg)   BMI 24.69 kg/m   Wt Readings from Last 3 Encounters:  02/23/22 160 lb (72.6 kg)  12/29/21 150 lb 6 oz (68.2 kg)  12/18/21 154 lb  (69.9 kg)       Physical exam: Gen: alert, NAD, not ill appearing Pulm: speaks in complete sentences without increased work of breathing Psych: normal mood, normal thought content      Results for orders placed or performed in visit on 12/22/21  PSA  Result Value Ref Range   PSA 0.20 0.10 - 4.00 ng/mL  Lipid panel  Result Value Ref Range   Cholesterol 200 0 - 200 mg/dL   Triglycerides 284.0 (H) 0.0 - 149.0 mg/dL   HDL 47.00 >39.00 mg/dL   VLDL 56.8 (H) 0.0 - 40.0 mg/dL   Total CHOL/HDL Ratio 4    NonHDL 153.43   Comprehensive metabolic panel  Result Value Ref Range   Sodium 139 135 - 145 mEq/L   Potassium 3.9 3.5 - 5.1 mEq/L   Chloride 103 96 - 112 mEq/L   CO2 27 19 - 32 mEq/L   Glucose, Bld 92 70 - 99 mg/dL   BUN 9 6 - 23 mg/dL   Creatinine, Ser 0.90 0.40 - 1.50 mg/dL   Total Bilirubin 0.5 0.2 - 1.2 mg/dL   Alkaline Phosphatase 109 39 - 117 U/L   AST 14 0 - 37 U/L   ALT 18 0 - 53 U/L   Total Protein 6.8 6.0 - 8.3 g/dL   Albumin 4.3 3.5 - 5.2 g/dL   GFR 91.37 >60.00 mL/min   Calcium 9.7 8.4 - 10.5 mg/dL  LDL cholesterol, direct  Result Value Ref Range   Direct LDL 113.0 mg/dL   Assessment & Plan:   Problem List Items Addressed This Visit     TOBACCO ABUSE    Continue to encourage smoking cessation as best option to decrease cardiovascular risk. He is currently on half to 1 pack a day, notes improvement when he uses Nicotrol inhaler.      Dyslipidemia - Primary    Reviewed recent FLP which showed significantly elevated triglycerides, however he notes that he had recently received a cervical epidural steroid injection and wonders if that contributed to the elevated triglycerides.  Reasonable to recheck in a month that he does not receive a steroid injection- I asked him to call and schedule fasting lab visit for next month.   No strong family history of strokes or heart attacks, recent chest CT did not have obvious coronary artery or aortic atherosclerotic disease.   We will also check lipoprotein a and apolipoprotein B to further help risk stratify.  Patient and wife agree with plan.      Relevant Orders  Lipid panel   Lipoprotein A (LPA)   Apolipoprotein B     No orders of the defined types were placed in this encounter.  Orders Placed This Encounter  Procedures   Lipid panel    Standing Status:   Future    Standing Expiration Date:   02/24/2023   Lipoprotein A (LPA)    Standing Status:   Future    Standing Expiration Date:   02/24/2023   Apolipoprotein B    Standing Status:   Future    Standing Expiration Date:   02/24/2023    I discussed the assessment and treatment plan with the patient. The patient was provided an opportunity to ask questions and all were answered. The patient agreed with the plan and demonstrated an understanding of the instructions. The patient was advised to call back or seek an in-person evaluation if the symptoms worsen or if the condition fails to improve as anticipated.  Follow up plan: No follow-ups on file.  Ria Bush, MD

## 2022-02-26 ENCOUNTER — Other Ambulatory Visit (INDEPENDENT_AMBULATORY_CARE_PROVIDER_SITE_OTHER): Payer: Medicare PPO

## 2022-02-26 DIAGNOSIS — E785 Hyperlipidemia, unspecified: Secondary | ICD-10-CM

## 2022-02-26 LAB — LIPID PANEL
Cholesterol: 190 mg/dL (ref 0–200)
HDL: 40.7 mg/dL (ref >39.00)
NonHDL: 149.7
Total CHOL/HDL Ratio: 5
Triglycerides: 257 mg/dL — ABNORMAL HIGH (ref 0.0–149.0)
VLDL: 51.4 mg/dL — ABNORMAL HIGH (ref 0.0–40.0)

## 2022-02-26 LAB — LDL CHOLESTEROL, DIRECT: Direct LDL: 110 mg/dL

## 2022-02-27 LAB — APOLIPOPROTEIN B: Apolipoprotein B: 95 mg/dL — ABNORMAL HIGH (ref <90)

## 2022-03-02 ENCOUNTER — Other Ambulatory Visit: Payer: Self-pay | Admitting: Family Medicine

## 2022-03-02 LAB — LIPOPROTEIN A (LPA): Lipoprotein (a): 54 nmol/L (ref <75)

## 2022-03-02 NOTE — Telephone Encounter (Signed)
Refill request Nicotrol Last refill 12/30/21  168 Last office visit 02/23/22

## 2022-03-15 ENCOUNTER — Telehealth: Payer: Self-pay

## 2022-03-15 ENCOUNTER — Telehealth (INDEPENDENT_AMBULATORY_CARE_PROVIDER_SITE_OTHER): Payer: Medicare PPO | Admitting: Family Medicine

## 2022-03-15 ENCOUNTER — Ambulatory Visit: Payer: Self-pay

## 2022-03-15 ENCOUNTER — Encounter: Payer: Self-pay | Admitting: Family Medicine

## 2022-03-15 VITALS — BP 111/86 | HR 103 | Temp 98.3°F | Ht 67.5 in | Wt 148.0 lb

## 2022-03-15 DIAGNOSIS — J439 Emphysema, unspecified: Secondary | ICD-10-CM

## 2022-03-15 DIAGNOSIS — F17200 Nicotine dependence, unspecified, uncomplicated: Secondary | ICD-10-CM | POA: Diagnosis not present

## 2022-03-15 DIAGNOSIS — U071 COVID-19: Secondary | ICD-10-CM | POA: Insufficient documentation

## 2022-03-15 HISTORY — DX: COVID-19: U07.1

## 2022-03-15 MED ORDER — MOLNUPIRAVIR EUA 200MG CAPSULE: 4.0000 | ORAL_CAPSULE | Freq: Two times a day (BID) | ORAL | 0 refills | Status: AC

## 2022-03-15 NOTE — Telephone Encounter (Signed)
[place] Night - Client TELEPHONE ADVICE RECORD [facility] Patient Name: Michael [name] Gender: Male DOB: [birth year] Age: 61 Y 11 M 25 D Return Phone Number: [phone] (Primary), [phone] (Secondary) Address: City/ State/ Zip: [place] [address] Client [place] Night - Client Client Site [place] Provider Ria Bush - MD Contact Type Call Who Is Calling Patient / Member / Family / Caregiver Call Type Triage / Clinical Caller Name Nollie Terlizzi Relationship To Patient Spouse Return Phone Number [phone] (Primary) Chief Complaint Diarrhea Reason for Call Symptomatic / Request for [place] states husband had diarrhea last night after pizza, this morning he is congested, fever at 100, did 2 covid tests, 1 had faint line, 2nd was negative. Translation No Nurse Assessment Nurse: Arvella Nigh, RN, Lubertha Basque Date/Time (Eastern Time): 03/13/2022 1:25:43 PM Confirm and document reason for call. If symptomatic, describe symptoms. ---Caller states husband had diarrhea last night but none this morning. States he has a cough, runny nose and congestion that started this morning. States he has taken a COVID test which one was negative and the other was positive. Temp about 69mn ago was 99.2 orally after Tylenol. Denies difficulty breathing/SOB/ chest pain but has a 8 out of 10 HA. Does the patient have any new or worsening symptoms? ---Yes Will a triage be completed? ---Yes Related visit to physician within the last 2 weeks? ---No Does the PT have any chronic conditions? (i.e. diabetes, asthma, this includes High risk factors for pregnancy, etc.) ---Yes List chronic conditions. ---COPD Emphysema Is this a behavioral health or substance abuse call? ---No Guidelines Guideline Title Affirmed Question Affirmed Notes Nurse Date/Time ([place]all[place] BayTime) COVID-19  - Diagnosed or Suspected [1] Drinking very little AND [2] dehydration suspected (e.g., no GArvella Nigh RN, [place] 03/13/2022 1:30:07 PM PLEASE NOTE: All timestamps contained within this report are represented as ER[place] Federation[place] Time. CONFIDENTIALTY NOTICE: This fax transmission is intended only for the addressee. It contains information that is legally privileged, confidential or otherwise protected from use or disclosure. If you are not the intended recipient, you are strictly prohibited from reviewing, disclosing, copying using or disseminating any of this information or taking any action in reliance on or regarding this information. If you have received this fax in error, please notify uKoreaimmediately by telephone so that we can arrange for its return to [place] Phone: [phone] Toll-Free: [phone] Fax: [phone]: 2 of 2 Call Id: [id number] Guideline Title Affirmed Question Affirmed Notes Nurse Date/Time (Eilene GhaziTime) urine > 12 hours, very dry mouth, very lightheaded) Disp. Time (Eilene GhaziTime) Disposition Final User 03/13/2022 1:34:35 PM Go to ED Now (or PCP triage) Yes GArvella Nigh RN, OLubertha BasqueFinal Disposition 03/13/2022 1:34:35 PM Go to ED Now (or PCP triage) Yes GArvella Nigh RN, ORozanna BoerDisagree/Comply Comply Caller Understands Yes PreDisposition InappropriateToAsk Care Advice Given Per Guideline GO TO ED NOW (OR PCP TRIAGE): * IF NO PCP (PRIMARY CARE PROVIDER) SECOND-LEVEL TRIAGE: You need to be seen within the next hour. [facility] the [facility] [place] as you can. ANOTHER ADULT SHOULD DRIVE: * It is better and safer if another adult drives instead of you. CARE ADVICE given per COVID-19 - DIAGNOSED OR SUSPECTED (Adult) guideline. Comments User: ORonn Melena RN Date/Time (Eilene GhaziTime): 03/13/2022 1:33:27 PM oxygen 94%; pulse 108 Referrals ALazy Y U

## 2022-03-15 NOTE — Assessment & Plan Note (Addendum)
Reviewed currently approved antiviral treatments.  Reviewed expected course of illness, anticipated course of recovery, as well as red flags to suggest COVID pneumonia and/or to seek urgent in-person care. Reviewed latest CDC isolation/quarantine guidelines.  Encouraged fluids and rest. Reviewed further supportive care measures at home including vit C '500mg'$  bid, vit D 2000 IU daily, zinc '100mg'$  daily, tylenol PRN, pepcid '20mg'$  BID PRN.   Recommend:  molnupiravir given paxlovid interactions Paxlovid drug interactions:  dexilant amitriptyline

## 2022-03-15 NOTE — Progress Notes (Signed)
Patient ID: Michael [name], male    DOB: [birth year], 61 y.o.   MRN: [id number]  Virtual visit completed through [place], a video enabled telemedicine application. Due to national recommendations of social distancing due to COVID-19, a virtual visit is felt to be most appropriate for this patient at this time. Reviewed limitations, risks, security and privacy concerns of performing a virtual visit and the availability of in person appointments. I also reviewed that there may be a patient responsible charge related to this service. The patient agreed to proceed.   Patient location: home Provider location: [place] at [facility], office Persons participating in this virtual visit: patient, provider   If any vitals were documented, they were collected by patient at home unless specified below.    BP 111/86   Pulse (!) 103   Temp 98.3 F (36.8 C)   Ht 5' 7.5" (1.715 m)   Wt 148 lb (67.1 kg)   SpO2 95%   BMI 22.84 kg/m    CC: COVID infection Subjective:   HPI: Michael [name] is a 61 y.o. male presenting on 03/15/2022 for Covid Positive (C/o dry cough, HA, runny nose and drainage.  Denies SOB, fever or body aches. Sxs started 03/12/22.  Pos home COVID test- 03/13/22. )   First day of symptoms: 03/12/2022 Tested COVID positive: 03/13/2022  Current symptoms: dry cough, significant headache, rhinorrhea and pndrainage. Also had diarrhea, head congestion. Chills, body aches, change in taste and loss of smell.  No: dyspnea, fever, wheezing, abd pain Treatments to date: tylenol, as well as left over amoxicillin  Risk factors include: age, smoking status, COPD by imaging  COVID vaccination status: [place] x2   First time he's had COVID infection.    Relevant past medical, surgical, family and social history reviewed and updated as indicated. Interim medical history since our last visit reviewed. Allergies and medications reviewed and updated. Outpatient Medications Prior to Visit  Medication Sig  Dispense Refill   Bioflavonoid Products (ESTER C PO) Take by mouth daily.     Cholecalciferol (VITAMIN D3 PO) Take by mouth.     cyanocobalamin 100 MCG tablet Take 100 mcg by mouth daily.      dexlansoprazole (DEXILANT) 60 MG capsule TAKE 1 CAPSULE BY MOUTH EVERY DAY 90 capsule 3   fish oil-omega-3 fatty acids 1000 MG capsule Take 2 g by mouth daily.     fluticasone (FLONASE) 50 MCG/ACT nasal spray SPRAY 2 SPRAYS INTO EACH NOSTRIL EVERY DAY 48 mL 1   HORIZANT 300 MG TBCR Take 1 tablet by mouth at bedtime.     Multiple Vitamin (MULTIVITAMIN) tablet Take 1 tablet by mouth daily.     NICOTROL 10 MG inhaler INHALE 1 CARTRIDGE (1 CONTINUOUS PUFFING TOTAL) INTO THE LUNGS AS NEEDED FOR SMOKING CESSATION. 168 each 0   nortriptyline (PAMELOR) 50 MG capsule Take 1 capsule (50 mg total) by mouth at bedtime. 90 capsule 3   oxyCODONE-acetaminophen (PERCOCET/ROXICET) 5-325 MG tablet Take 1 tablet by mouth at bedtime.     Probiotic Product (PROBIOTIC PO) Take by mouth.     tiZANidine (ZANAFLEX) 2 MG tablet Take 2 tablets by mouth at bedtime. Takes as needed     Facility-Administered Medications Prior to Visit  Medication Dose Route Frequency Provider Last Rate Last Admin   0.9 %  sodium chloride infusion  500 mL Intravenous Once Ladene Artist, MD         Per HPI unless specifically indicated in ROS section below  Review of Systems Objective:  BP 111/86   Pulse (!) 103   Temp 98.3 F (36.8 C)   Ht 5' 7.5" (1.715 m)   Wt 148 lb (67.1 kg)   SpO2 95%   BMI 22.84 kg/m   Wt Readings from Last 3 Encounters:  03/15/22 148 lb (67.1 kg)  02/23/22 160 lb (72.6 kg)  12/29/21 150 lb 6 oz (68.2 kg)       Physical exam: Gen: alert, NAD, not ill appearing Pulm: speaks in complete sentences without increased work of breathing Psych: normal mood, normal thought content      Results for orders placed or performed in visit on 02/26/22  Apolipoprotein B  Result Value Ref Range   Apolipoprotein B 95 (H)  <90 mg/dL  Lipoprotein A (LPA)  Result Value Ref Range   Lipoprotein (a) 54 <75 nmol/L  Lipid panel  Result Value Ref Range   Cholesterol 190 0 - 200 mg/dL   Triglycerides 257.0 (H) 0.0 - 149.0 mg/dL   HDL 40.70 >39.00 mg/dL   VLDL 51.4 (H) 0.0 - 40.0 mg/dL   Total CHOL/HDL Ratio 5    NonHDL 149.70   LDL cholesterol, direct  Result Value Ref Range   Direct LDL 110.0 mg/dL   Lab Results  Component Value Date   CREATININE 0.90 12/22/2021   BUN 9 12/22/2021   NA 139 12/22/2021   K 3.9 12/22/2021   CL 103 12/22/2021   CO2 27 12/22/2021  GFR 91  Assessment & Plan:   Problem List Items Addressed This Visit     TOBACCO ABUSE   Emphysema of lung ([place])   COVID-19 virus infection - Primary    Reviewed currently approved antiviral treatments.  Reviewed expected course of illness, anticipated course of recovery, as well as red flags to suggest COVID pneumonia and/or to seek urgent in-person care. Reviewed latest CDC isolation/quarantine guidelines.  Encouraged fluids and rest. Reviewed further supportive care measures at home including vit C '500mg'$  bid, vit D 2000 IU daily, zinc '100mg'$  daily, tylenol PRN, pepcid '20mg'$  BID PRN.   Recommend:  molnupiravir given paxlovid interactions Paxlovid drug interactions:  dexilant amitriptyline      Relevant Medications   molnupiravir EUA (LAGEVRIO) 200 mg CAPS capsule     Meds ordered this encounter  Medications   molnupiravir EUA (LAGEVRIO) 200 mg CAPS capsule    Sig: Take 4 capsules (800 mg total) by mouth 2 (two) times daily for 5 days.    Dispense:  40 capsule    Refill:  0   No orders of the defined types were placed in this encounter.   I discussed the assessment and treatment plan with the patient. The patient was provided an opportunity to ask questions and all were answered. The patient agreed with the plan and demonstrated an understanding of the instructions. The patient was advised to call back or seek an in-person  evaluation if the symptoms worsen or if the condition fails to improve as anticipated.  Follow up plan: Return if symptoms worsen or fail to improve.  Ria Bush, MD

## 2022-03-15 NOTE — Telephone Encounter (Signed)
I spoke with pt's wife ; pt did not go to UC or ED and did home covid test which was positive on 03/13/22' symptoms started on 03/12/22 in evening; pt has dry cough and a lot of drainage; no CP, SOB or high fever.I scheduled appt at [facility] 03/15/22 at 4 PM. I cancelled that appt and Dr Darnell Level is going to do VV 03/15/22 at 4:30.Self quarantine, drink plenty of fluids, rest, and take Tylenol for fever. UC & ED precautions given and pt's wife voiced understanding. Sending note to DR Baldwin Crown CMA. Pts wife will have vital signs ready for appt.

## 2022-04-08 ENCOUNTER — Institutional Professional Consult (permissible substitution): Payer: Medicare PPO | Admitting: Pulmonary Disease

## 2022-05-19 ENCOUNTER — Other Ambulatory Visit: Payer: Self-pay | Admitting: Acute Care

## 2022-05-19 DIAGNOSIS — F17210 Nicotine dependence, cigarettes, uncomplicated: Secondary | ICD-10-CM

## 2022-05-19 DIAGNOSIS — R911 Solitary pulmonary nodule: Secondary | ICD-10-CM

## 2022-05-19 DIAGNOSIS — Z87891 Personal history of nicotine dependence: Secondary | ICD-10-CM

## 2022-06-03 ENCOUNTER — Encounter: Payer: Self-pay | Admitting: Pulmonary Disease

## 2022-06-03 ENCOUNTER — Ambulatory Visit: Payer: Medicare PPO | Admitting: Pulmonary Disease

## 2022-06-03 VITALS — BP 110/70 | HR 97 | Temp 98.1°F | Ht 67.5 in | Wt 152.8 lb

## 2022-06-03 DIAGNOSIS — R0602 Shortness of breath: Secondary | ICD-10-CM

## 2022-06-03 DIAGNOSIS — J439 Emphysema, unspecified: Secondary | ICD-10-CM | POA: Diagnosis not present

## 2022-06-03 DIAGNOSIS — F17210 Nicotine dependence, cigarettes, uncomplicated: Secondary | ICD-10-CM

## 2022-06-03 DIAGNOSIS — J449 Chronic obstructive pulmonary disease, unspecified: Secondary | ICD-10-CM

## 2022-06-03 MED ORDER — NICOTINE 10 MG IN INHA
RESPIRATORY_TRACT | 0 refills | Status: DC
Start: 2022-06-03 — End: 2024-05-08

## 2022-06-03 MED ORDER — ALBUTEROL SULFATE HFA 108 (90 BASE) MCG/ACT IN AERS: 2.0000 | INHALATION_SPRAY | Freq: Four times a day (QID) | RESPIRATORY_TRACT | 2 refills | Status: DC | PRN

## 2022-06-03 MED ORDER — TRELEGY ELLIPTA 100-62.5-25 MCG/ACT IN AEPB: 1.0000 | INHALATION_SPRAY | Freq: Every day | RESPIRATORY_TRACT | 0 refills | Status: DC

## 2022-06-03 MED ORDER — TRELEGY ELLIPTA 100-62.5-25 MCG/ACT IN AEPB: 1.0000 | INHALATION_SPRAY | Freq: Every day | RESPIRATORY_TRACT | 11 refills | Status: DC

## 2022-06-03 NOTE — Progress Notes (Signed)
Patient shown how to use his Trelegy and Albuterol inhalers. He understood the directions.

## 2022-06-03 NOTE — Patient Instructions (Signed)
We are ordering some breathing test to see how severe your emphysema is.  Also checking a blood test to check for hereditary emphysema.  You need to quit smoking due to the severity of emphysema that is evident on your CT.  We are giving you a trial of an inhaler called Trelegy Ellipta this is 1 puff daily, make sure you rinse your mouth well after you use it.  We will send also a prescription for an inhaler that is an "emergency inhaler" which you can use if you get very short of breath.  We will see you in follow-up in 4 to 6 weeks time call sooner should any new problems arise.

## 2022-06-03 NOTE — Progress Notes (Signed)
Subjective:    Patient ID: Michael [name], male    DOB: June 30, 1961, 61 y.o.   MRN: [id number] Patient Care Team: Eustaquio Boyden, MD as PCP - General [facility])  Chief Complaint  Patient presents with   Shortness of Breath    SOB for 2-3 years. Chest CT in June. No wheezing. Occasional cough.     HPI Patient is a 61 year old current smoker (0.75 PPD, 40 PY) who presents for evaluation on the issue of COPD on the basis of emphysema and shortness of breath.  He is kindly referred by Dr. Eustaquio Boyden.  He was noted to have significant emphysema on lung cancer screening chest CT.  The patient has noted shortness of breath for approximately 2 to 3 years.  Notes this mostly when going up stairs or inclines.  Resting makes the symptoms better.  Currently he is not on any inhalers.  He has rare occasional cough productive of whitish to yellowish sputum.  He unfortunately continues to smoke.  He has tried multiple modalities to quit smoking but has not tolerated them these include patches, "pills" and decreasing amount of cigarettes smoked gradually.  He does not endorse any orthopnea or paroxysmal nocturnal dyspnea.  No lower extremity edema or calf tenderness.  No chest pain.  He has occasional wheezing.  This responds to albuterol.   He has significant emphysema by CT scan of the chest performed for lung cancer screening purposes.  He is currently retired, takes care of both elderly parents.  No military history.  No significant occupational exposure.     Review of Systems A 10 point review of systems was performed and it is as noted above otherwise negative.  Past Medical History:  Diagnosis Date   Adenomatous colon polyp 02/2008   AK (actinic keratosis)    Allergy    Basal cell carcinoma of face    Mohs procedure   COPD (chronic obstructive pulmonary disease) (HCC)    Dyslipidemia 02/29/2016   Esophagitis 02/13/2001   Gastritis 02/13/2001   GERD (gastroesophageal  reflux disease)    Hemorrhoids    Hiatal hernia    HNP (herniated nucleus pulposus), cervical 09/30/2016   With radiculopathy, s/p anterior discectomy Venetia Maxon) 12/2016   Reactive depression (situational)    Renal stones    Smoker    Past Surgical History:  Procedure Laterality Date   ANTERIOR CERVICAL DISCECTOMY  12/2016   C5/6 (Dr Venetia Maxon)   COLONOSCOPY  02/14/2003   polyps, ext hemorrhoids   COLONOSCOPY  2009   polyps, rec rpt 5 yrs   COLONOSCOPY  2015   5 polyps, rec rpt 5 yrs Russella Dar)   COLONOSCOPY  01/2019   multiple polyps including TAs, rpt 2-3 yrs Russella Dar)   COLONOSCOPY  03/2021   TA/HP, rpt 3 yrs Russella Dar)   ESOPHAGOGASTRODUODENOSCOPY  02/14/2003   esophagitis, gastritis, duodenitis without hemorrhage   ESOPHAGOGASTRODUODENOSCOPY  01/2019   chronic gastritis, no H pylori Russella Dar)   MOHS SURGERY     POLYPECTOMY     TONSILLECTOMY     Patient Active Problem List   Diagnosis Date Noted   COVID-19 virus infection 03/15/2022   Advanced directives, counseling/discussion 12/24/2020   Papilledema 04/02/2020   Pulmonary nodules 02/12/2020   Emphysema of lung (HCC) 02/12/2020   Constipation due to pain medication 10/16/2019   BPH (benign prostatic hyperplasia) 07/11/2018   Right inguinal hernia 07/11/2018   Cervical stenosis of spine 01/02/2018   HNP (herniated nucleus pulposus), cervical 09/30/2016   Dyslipidemia  02/29/2016   Hearing loss due to cerumen impaction 12/05/2015   History of kidney stones 03/06/2014   Healthcare maintenance 01/28/2012   Situational depression 10/08/2009   GERD 05/23/2009   PERSONAL HX COLONIC POLYPS 05/23/2009   INSOMNIA 02/04/2009   TOBACCO ABUSE 05/01/2007   Family History  Problem Relation Age of Onset   Hypertension Father    Gout Father    GER disease Father    Colon polyps Father    Breast cancer Mother    Colon polyps Brother    Glaucoma Brother        early onset   Heart attack Paternal Grandfather    Transient ischemic attack  Maternal Grandfather    Diabetes Neg Hx    Colon cancer Neg Hx    Esophageal cancer Neg Hx    Rectal cancer Neg Hx    Stomach cancer Neg Hx    Social History   Tobacco Use   Smoking status: Every Day    Packs/day: 1.00    Years: 40.00    Total pack years: 40.00    Types: Cigarettes   Smokeless tobacco: Never   Tobacco comments:    0.75 PPD 06/03/2025  Substance Use Topics   Alcohol use: No   Allergies  Allergen Reactions   Aspirin     REACTION: "did not work"   Codeine Sulfate     REACTION: "did not work"   Current Meds  Medication Sig   Bioflavonoid Products (ESTER C PO) Take by mouth daily.   Cholecalciferol (VITAMIN D3 PO) Take by mouth.   cyanocobalamin 100 MCG tablet Take 100 mcg by mouth daily.    dexlansoprazole (DEXILANT) 60 MG capsule TAKE 1 CAPSULE BY MOUTH EVERY DAY   fish oil-omega-3 fatty acids 1000 MG capsule Take 2 g by mouth daily.   fluticasone (FLONASE) 50 MCG/ACT nasal spray SPRAY 2 SPRAYS INTO EACH NOSTRIL EVERY DAY   HORIZANT 300 MG TBCR Take 1 tablet by mouth at bedtime.   Multiple Vitamin (MULTIVITAMIN) tablet Take 1 tablet by mouth daily.   nortriptyline (PAMELOR) 50 MG capsule Take 1 capsule (50 mg total) by mouth at bedtime.   oxyCODONE-acetaminophen (PERCOCET/ROXICET) 5-325 MG tablet Take 1 tablet by mouth at bedtime.   Probiotic Product (PROBIOTIC PO) Take by mouth.   tiZANidine (ZANAFLEX) 2 MG tablet Take 2 tablets by mouth at bedtime. Takes as needed   Current Facility-Administered Medications for the 06/03/22 encounter (Office Visit) with Salena Saner, MD  Medication   0.9 %  sodium chloride infusion        Objective:   Physical Exam BP 110/70 (BP Location: Left Arm, Cuff Size: Normal)   Pulse 97   Temp 98.1 F (36.7 C)   Ht 5' 7.5" (1.715 m)   Wt 152 lb 12.8 oz (69.3 kg)   SpO2 95%   BMI 23.58 kg/m   SpO2: 95 % O2 Device: None (Room air)  GENERAL: Well-developed, well-nourished gentleman, no acute distress. HEAD:  Normocephalic, atraumatic.  EYES: Pupils equal, round, reactive to light.  No scleral icterus.  MOUTH: Nose/mouth/throat not examined due to institutional masking requirements. NECK: Supple. No thyromegaly. Trachea midline. No JVD.  No adenopathy. PULMONARY: Good air entry bilaterally.  Coarse otherwise, no adventitious sounds. CARDIOVASCULAR: S1 and S2. Regular rate and rhythm.  No rubs, murmurs or gallops heard. ABDOMEN: Benign. MUSCULOSKELETAL: No joint deformity, no clubbing, no edema.  NEUROLOGIC: No overt focal deficit, no gait disturbance, speech is fluent. SKIN: Intact,warm,dry. PSYCH: Mood  and behavior normal.  Representative images from CT chest performed 26 January 2022 showing significant paraseptal and centrilobular emphysema and apical blebs:      Assessment & Plan:     ICD-10-CM   1. COPD suggested by initial evaluation [facility])  J44.9 Pulmonary Function Test [facility] Only    Alpha-1 antitrypsin phenotype   Significant emphysema on imaging Patient symptomatic PFT/alpha-1 antitrypsin Trial of Trelegy Ellipta As needed albuterol    2. Pulmonary emphysema, unspecified emphysema type (HCC)  J43.9    Alpha 1 antitrypsin    3. Shortness of breath  R06.02    Likely due to poorly compensated COPD Trial of Trelegy as above    4. Tobacco dependence due to cigarettes  F17.210    Patient counseled regards to discontinuation of smoking Counseling time 3 to 5 minutes He is enrolled in lung cancer screening program Nicotrol inhaler     Orders Placed This Encounter  Procedures   Alpha-1 antitrypsin phenotype    Standing Status:   Future    Standing Expiration Date:   06/04/2023   Pulmonary Function Test [facility] Only    Standing Status:   Future    Number of Occurrences:   1    Standing Expiration Date:   06/04/2023    Order Specific Question:   Full PFT: includes the following: basic spirometry, spirometry pre & post bronchodilator, diffusion capacity (DLCO), lung volumes     Answer:   Full PFT    Order Specific Question:   This test can only be performed at    Answer:   [facility]   Meds ordered this encounter  Medications   nicotine (NICOTROL) 10 MG inhaler    Sig: INHALE 1 CARTRIDGE (1 CONTINUOUS PUFFING TOTAL) INTO THE LUNGS AS NEEDED FOR SMOKING CESSATION.    Dispense:  168 each    Refill:  0   Fluticasone-Umeclidin-Vilant (TRELEGY ELLIPTA) 100-62.5-25 MCG/ACT AEPB    Sig: Inhale 1 puff into the lungs daily.    Dispense:  14 each    Refill:  0    Order Specific Question:   Lot Number?    Answer:   [id number]    Order Specific Question:   Expiration Date?    Answer:   11/07/2021    Order Specific Question:   Quantity    Answer:   1   Fluticasone-Umeclidin-Vilant (TRELEGY ELLIPTA) 100-62.5-25 MCG/ACT AEPB    Sig: Inhale 1 puff into the lungs daily.    Dispense:  28 each    Refill:  11   albuterol (VENTOLIN HFA) 108 (90 Base) MCG/ACT inhaler    Sig: Inhale 2 puffs into the lungs every 6 (six) hours as needed.    Dispense:  8 g    Refill:  2   We will see the patient in follow-up in 4 to 6 weeks time he is to contact us prior to that time should any new difficulties arise.  Gailen Shelter, MD Advanced Bronchoscopy PCCM [facility]    *This note was dictated using voice recognition software/Dragon.  Despite best efforts to proofread, errors can occur which can change the meaning. Any transcriptional errors that result from this process are unintentional and may not be fully corrected at the time of dictation.

## 2022-06-28 ENCOUNTER — Other Ambulatory Visit
Admission: RE | Admit: 2022-06-28 | Discharge: 2022-06-28 | Disposition: A | Discharge status: Home / Self Care | Payer: Medicare PPO | Attending: Pulmonary Disease | Admitting: Pulmonary Disease

## 2022-06-28 DIAGNOSIS — J439 Emphysema, unspecified: Secondary | ICD-10-CM | POA: Insufficient documentation

## 2022-07-05 LAB — ALPHA-1-ANTITRYPSIN PHENOTYP: A-1 Antitrypsin, Ser: 155 mg/dL (ref 101–187)

## 2022-07-06 NOTE — Progress Notes (Signed)
Patient has emphysema as part of the workup alpha 1 antitrypsin was obtained due to family history of emphysema without tobacco exposure.  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  Renold Don, MD Advanced Bronchoscopy PCCM [facility]

## 2022-07-13 ENCOUNTER — Ambulatory Visit: Payer: Medicare PPO | Attending: Pulmonary Disease

## 2022-07-13 DIAGNOSIS — F17210 Nicotine dependence, cigarettes, uncomplicated: Secondary | ICD-10-CM | POA: Insufficient documentation

## 2022-07-13 DIAGNOSIS — J449 Chronic obstructive pulmonary disease, unspecified: Secondary | ICD-10-CM | POA: Insufficient documentation

## 2022-07-13 DIAGNOSIS — J439 Emphysema, unspecified: Secondary | ICD-10-CM | POA: Diagnosis not present

## 2022-07-13 MED ORDER — ALBUTEROL SULFATE (2.5 MG/3ML) 0.083% IN NEBU
2.5000 mg | INHALATION_SOLUTION | Freq: Once | RESPIRATORY_TRACT | Status: AC
  Administered 2022-07-13: 2.5 mg via RESPIRATORY_TRACT
  Filled 2022-07-13: qty 3

## 2022-07-15 ENCOUNTER — Encounter: Payer: Self-pay | Admitting: Pulmonary Disease

## 2022-07-15 ENCOUNTER — Ambulatory Visit: Payer: Medicare PPO | Admitting: Pulmonary Disease

## 2022-07-15 VITALS — BP 118/80 | HR 109 | Temp 97.0°F | Ht 67.5 in | Wt 152.4 lb

## 2022-07-15 DIAGNOSIS — J439 Emphysema, unspecified: Secondary | ICD-10-CM | POA: Diagnosis not present

## 2022-07-15 DIAGNOSIS — R0602 Shortness of breath: Secondary | ICD-10-CM | POA: Diagnosis not present

## 2022-07-15 DIAGNOSIS — J4489 Other specified chronic obstructive pulmonary disease: Secondary | ICD-10-CM

## 2022-07-15 DIAGNOSIS — F17210 Nicotine dependence, cigarettes, uncomplicated: Secondary | ICD-10-CM

## 2022-07-15 LAB — NITRIC OXIDE: Nitric Oxide: 14

## 2022-07-15 NOTE — Progress Notes (Signed)
Subjective:    Patient ID: Michael [name], male    DOB: 01/29/1961, 61 y.o.   MRN: [id number] Patient Care Team: Eustaquio Boyden, MD as PCP - General (Family Medicine) Salena Saner, MD as Consulting Physician (Pulmonary Disease)  Chief Complaint  Patient presents with   Follow-up    Cough with brownish sputum. No SOB or wheezing.    HPI Patient is a 61 year old current smoker (0.75 PPD, 40 PY) who presents for follow-up on the issue of COPD on the basis of emphysema and shortness of breath. He was initially evaluated on 03 June 2022.  He was advised to have significant emphysema on chest CT.  Alpha-1 antitrypsin was requested, he is phenotype MS, level normal.  He also had PFTs which are consistent with moderate obstructive airways disease with reversible component (see below).  He was started on Trelegy Ellipta 100 and still notices shortness of breath on exertion but notes that the Trelegy helps him.  He has had no cough.  He unfortunately continues to smoke.  He states that he has too many stressors in his life currently.  He is taking care of of both elderly parents and feels overwhelmed.  He does not endorse any orthopnea or paroxysmal nocturnal dyspnea.  No lower extremity edema or calf tenderness.  No chest pain.  He has occasional wheezing.  This responds to albuterol.   He has significant emphysema by CT scan of the chest.  DATA 06/28/2022 alpha-1 antitrypsin: Phenotype MS, level 155 mg/dL (reference 657 to 846 mg/dL).  Patient is a carrier but does not have disease. 07/13/2022 PFTs: FEV1 2.03 L or 62% predicted, FVC 3.39 L or 78% predicted, FEV1/FVC 60% predicted, he had a very significant bronchodilator response with 33% net change in FEV1 postbronchodilator.  Lung volumes normal.  Diffusion capacity results invalid due to error code during testing.  Consistent with moderate obstructive airways disease with reversible component (COPD asthma overlap).  Review of  Systems A 10 point review of systems was performed and it is as noted above otherwise negative.  Patient Active Problem List   Diagnosis Date Noted   COVID-19 virus infection 03/15/2022   Advanced directives, counseling/discussion 12/24/2020   Papilledema 04/02/2020   Pulmonary nodules 02/12/2020   Emphysema of lung (HCC) 02/12/2020   Constipation due to pain medication 10/16/2019   BPH (benign prostatic hyperplasia) 07/11/2018   Right inguinal hernia 07/11/2018   Cervical stenosis of spine 01/02/2018   HNP (herniated nucleus pulposus), cervical 09/30/2016   Dyslipidemia 02/29/2016   Hearing loss due to cerumen impaction 12/05/2015   History of kidney stones 03/06/2014   Healthcare maintenance 01/28/2012   Situational depression 10/08/2009   GERD 05/23/2009   PERSONAL HX COLONIC POLYPS 05/23/2009   INSOMNIA 02/04/2009   TOBACCO ABUSE 05/01/2007   Social History   Tobacco Use   Smoking status: Every Day    Packs/day: 1.00    Years: 40.00    Total pack years: 40.00    Types: Cigarettes   Smokeless tobacco: Never   Tobacco comments:    0.75 PPD 07/15/2022  Substance Use Topics   Alcohol use: No   Allergies  Allergen Reactions   Aspirin     REACTION: "did not work"   Codeine Sulfate     REACTION: "did not work"   Current Meds  Medication Sig   albuterol (VENTOLIN HFA) 108 (90 Base) MCG/ACT inhaler Inhale 2 puffs into the lungs every 6 (six) hours as needed.   Bioflavonoid  Products (ESTER C PO) Take by mouth daily.   Cholecalciferol (VITAMIN D3 PO) Take by mouth.   cyanocobalamin 100 MCG tablet Take 100 mcg by mouth daily.    dexlansoprazole (DEXILANT) 60 MG capsule TAKE 1 CAPSULE BY MOUTH EVERY DAY   fish oil-omega-3 fatty acids 1000 MG capsule Take 2 g by mouth daily.   fluticasone (FLONASE) 50 MCG/ACT nasal spray SPRAY 2 SPRAYS INTO EACH NOSTRIL EVERY DAY   Fluticasone-Umeclidin-Vilant (TRELEGY ELLIPTA) 100-62.5-25 MCG/ACT AEPB Inhale 1 puff into the lungs daily.    Fluticasone-Umeclidin-Vilant (TRELEGY ELLIPTA) 100-62.5-25 MCG/ACT AEPB Inhale 1 puff into the lungs daily.   HORIZANT 300 MG TBCR Take 1 tablet by mouth at bedtime.   Multiple Vitamin (MULTIVITAMIN) tablet Take 1 tablet by mouth daily.   nicotine (NICOTROL) 10 MG inhaler INHALE 1 CARTRIDGE (1 CONTINUOUS PUFFING TOTAL) INTO THE LUNGS AS NEEDED FOR SMOKING CESSATION.   nortriptyline (PAMELOR) 50 MG capsule Take 1 capsule (50 mg total) by mouth at bedtime.   oxyCODONE-acetaminophen (PERCOCET/ROXICET) 5-325 MG tablet Take 1 tablet by mouth at bedtime.   Probiotic Product (PROBIOTIC PO) Take by mouth.   tiZANidine (ZANAFLEX) 2 MG tablet Take 2 tablets by mouth at bedtime. Takes as needed   Current Facility-Administered Medications for the 07/15/22 encounter (Office Visit) with Salena Saner, MD  Medication   0.9 %  sodium chloride infusion   Immunization History  Administered Date(s) Administered   Influenza,inj,Quad PF,6+ Mos 07/11/2018   Influenza-Unspecified 05/09/2014, 05/26/2016, 05/16/2017   PFIZER(Purple Top)SARS-COV-2 Vaccination 11/02/2019, 11/27/2019   Td 08/10/2002   Tdap 07/25/2014       Objective:   Physical Exam BP 118/80 (BP Location: Left Arm, Cuff Size: Normal)   Pulse (!) 109   Temp (!) 97 F (36.1 C)   Ht 5' 7.5" (1.715 m)   Wt 152 lb 6.4 oz (69.1 kg)   SpO2 97%   BMI 23.52 kg/m   SpO2: 97 % O2 Device: None (Room air)  GENERAL: Well-developed, well-nourished gentleman, no acute distress. HEAD: Normocephalic, atraumatic.  EYES: Pupils equal, round, reactive to light.  No scleral icterus.  MOUTH: Nose/mouth/throat not examined due to institutional masking requirements. NECK: Supple. No thyromegaly. Trachea midline. No JVD.  No adenopathy. PULMONARY: Good air entry bilaterally.  Coarse otherwise, no adventitious sounds. CARDIOVASCULAR: S1 and S2. Regular rate and rhythm.  No rubs, murmurs or gallops heard. ABDOMEN: Benign. MUSCULOSKELETAL: No joint  deformity, no clubbing, no edema.  NEUROLOGIC: No overt focal deficit, no gait disturbance, speech is fluent. SKIN: Intact,warm,dry. PSYCH: Mood and behavior normal.   Lab Results  Component Value Date   NITRICOXIDE 14 07/15/2022       Assessment & Plan:     ICD-10-CM   1. 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 Pulmonary emphysema, unspecified emphysema type (HCC)  J43.9    Phenotype MS Level normal    3. Tobacco dependence due to cigarettes  F17.210    Patient counseled regards to discontinuation of smoking Total counseling time 3 to 5 minutes Patient provided with resources for smoking cessation     Smoking cessation instruction/counseling given:  counseled patient on the dangers of tobacco use, advised patient to stop smoking, and reviewed strategies to maximize success.  Will see the patient in follow-up in 3 months time he is to contact us prior to that time should any new problems arise.  Gailen Shelter, MD Advanced Bronchoscopy  PCCM [facility]    *This note was dictated using voice recognition software/Dragon.  Despite best efforts to proofread, errors can occur which can change the meaning. Any transcriptional errors that result from this process are unintentional and may not be fully corrected at the time of dictation.

## 2022-07-15 NOTE — Patient Instructions (Addendum)
The inhaler that you are using now is working well for you.  Continue using it.  We talked to the use of the rescue inhaler.  I recommend that you engage in smoking cessation  You can receive free nicotine replacement therapy (patches, gum, or mints) by calling 1-800-QUIT NOW. Please call so we can get you on the path to becoming a non-smoker. I know it is hard, but you can do this!  www.becomeanex.org is a good website to check  We will see you in follow-up in 3 months time call sooner should any new problems arise.

## 2022-07-16 ENCOUNTER — Other Ambulatory Visit: Payer: Self-pay | Admitting: Family Medicine

## 2022-07-28 ENCOUNTER — Ambulatory Visit: Admission: RE | Admit: 2022-07-28 | Payer: Medicare PPO | Source: Ambulatory Visit

## 2022-09-15 ENCOUNTER — Ambulatory Visit
Admission: RE | Admit: 2022-09-15 | Discharge: 2022-09-15 | Disposition: A | Discharge status: Home / Self Care | Payer: Medicare PPO | Source: Ambulatory Visit | Attending: Acute Care | Admitting: Acute Care

## 2022-09-15 DIAGNOSIS — F17210 Nicotine dependence, cigarettes, uncomplicated: Secondary | ICD-10-CM | POA: Diagnosis not present

## 2022-09-15 DIAGNOSIS — R911 Solitary pulmonary nodule: Secondary | ICD-10-CM | POA: Insufficient documentation

## 2022-09-15 DIAGNOSIS — Z87891 Personal history of nicotine dependence: Secondary | ICD-10-CM | POA: Diagnosis not present

## 2022-09-17 ENCOUNTER — Other Ambulatory Visit: Payer: Self-pay

## 2022-09-17 DIAGNOSIS — Z87891 Personal history of nicotine dependence: Secondary | ICD-10-CM

## 2022-09-17 DIAGNOSIS — Z122 Encounter for screening for malignant neoplasm of respiratory organs: Secondary | ICD-10-CM

## 2022-10-21 ENCOUNTER — Encounter: Payer: Self-pay | Admitting: Pulmonary Disease

## 2022-10-21 ENCOUNTER — Ambulatory Visit: Payer: Medicare PPO | Admitting: Pulmonary Disease

## 2022-10-21 VITALS — BP 118/78 | HR 100 | Temp 98.2°F | Ht 67.5 in | Wt 153.2 lb

## 2022-10-21 DIAGNOSIS — J4489 Other specified chronic obstructive pulmonary disease: Secondary | ICD-10-CM | POA: Diagnosis not present

## 2022-10-21 DIAGNOSIS — J432 Centrilobular emphysema: Secondary | ICD-10-CM

## 2022-10-21 DIAGNOSIS — F17210 Nicotine dependence, cigarettes, uncomplicated: Secondary | ICD-10-CM | POA: Diagnosis not present

## 2022-10-21 DIAGNOSIS — J439 Emphysema, unspecified: Secondary | ICD-10-CM

## 2022-10-21 DIAGNOSIS — Z148 Genetic carrier of other disease: Secondary | ICD-10-CM | POA: Diagnosis not present

## 2022-10-21 MED ORDER — TRELEGY ELLIPTA 100-62.5-25 MCG/ACT IN AEPB: 1.0000 | INHALATION_SPRAY | Freq: Every day | RESPIRATORY_TRACT | 0 refills | Status: DC

## 2022-10-21 NOTE — Progress Notes (Signed)
Subjective:    Patient ID: Michael [name], male    DOB: 07-16-1961, 62 y.o.   MRN: [id number] Patient Care Team: Ria Bush, MD as PCP - General (Family Medicine) Tyler Pita, MD as Consulting Physician (Pulmonary Disease)  Chief Complaint  Patient presents with   Follow-up    SOB with exertion. Some wheezing. No cough.     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 He was last evaluated on 15 July 2022.  At that time he was noted to be doing well on Trelegy Ellipta and as needed albuterol.  He presents today still doing well with this regimen.  Still notices shortness of breath on exertion.  He has had no cough.  He unfortunately has increase his amount of cigarettes smoked during the day.  He states that he has too many stressors in his life currently.  He is taking care of of both elderly parents and feels overwhelmed.  Not endorse any orthopnea or paroxysmal nocturnal dyspnea.  No lower extremity edema or calf tenderness.  No chest pain.  He has occasional wheezing.  This responds to albuterol.  He has significant emphysema by CT scan of the chest.  He has significant difficulty affording Trelegy.  DATA 06/28/2022 alpha-1 antitrypsin: Phenotype MS, level 155 mg/dL (reference 101 to 187 mg/dL).  Patient is a carrier but does not have disease. 07/13/2022 PFTs: FEV1 2.03 L or 62% predicted, FVC 3.39 L or 78% predicted, FEV1/FVC 60% predicted, he had a very significant bronchodilator response with 33% net change in FEV1 postbronchodilator.  Lung volumes normal.  Diffusion capacity results invalid due to error code during testing.  Consistent with moderate obstructive airways disease with reversible component (COPD asthma overlap). 09/15/2022 LDCT chest: Lung RADS 2, severe paraseptal and centrilobular emphysema.  No consolidation, pleural effusion or  pneumothorax.  Previously noted solid pulmonary nodule in the left lower lobe has decreased in size measuring 2.1 mm (4.1 mm prior) other bilateral solid pulmonary nodules stable.   Review of Systems A 10 point review of systems was performed and it is as noted above otherwise negative.  Patient Active Problem List   Diagnosis Date Noted   COVID-19 virus infection 03/15/2022   Advanced directives, counseling/discussion 12/24/2020   Papilledema 04/02/2020   Pulmonary nodules 02/12/2020   Emphysema of lung ([place]) 02/12/2020   Constipation due to pain medication 10/16/2019   BPH (benign prostatic hyperplasia) 07/11/2018   Right inguinal hernia 07/11/2018   Cervical stenosis of spine 01/02/2018   HNP (herniated nucleus pulposus), cervical 09/30/2016   Dyslipidemia 02/29/2016   Hearing loss due to cerumen impaction 12/05/2015   History of kidney stones 03/06/2014   Healthcare maintenance 01/28/2012   Situational depression 10/08/2009   GERD 05/23/2009   PERSONAL HX COLONIC POLYPS 05/23/2009   INSOMNIA 02/04/2009   TOBACCO ABUSE 05/01/2007   Social History   Tobacco Use   Smoking status: Every Day    Packs/day: 1.00    Years: 40.00    Additional pack years: 0.00    Total pack years: 40.00    Types: Cigarettes   Smokeless tobacco: Never   Tobacco comments:    0.75 PPD 10/21/2022  Substance Use Topics   Alcohol use: No   Allergies  Allergen Reactions   Aspirin     REACTION: "did not work"   Codeine Sulfate  REACTION: "did not work"   Current Meds  Medication Sig   albuterol (VENTOLIN HFA) 108 (90 Base) MCG/ACT inhaler Inhale 2 puffs into the lungs every 6 (six) hours as needed.   Bioflavonoid Products (ESTER C PO) Take by mouth daily.   Cholecalciferol (VITAMIN D3 PO) Take by mouth.   cyanocobalamin 100 MCG tablet Take 100 mcg by mouth daily.    dexlansoprazole (DEXILANT) 60 MG capsule TAKE 1 CAPSULE BY MOUTH EVERY DAY   fish oil-omega-3 fatty acids 1000 MG capsule  Take 2 g by mouth daily.   fluticasone (FLONASE) 50 MCG/ACT nasal spray SPRAY 2 SPRAYS INTO EACH NOSTRIL EVERY DAY   Fluticasone-Umeclidin-Vilant (TRELEGY ELLIPTA) 100-62.5-25 MCG/ACT AEPB Inhale 1 puff into the lungs daily.   Fluticasone-Umeclidin-Vilant (TRELEGY ELLIPTA) 100-62.5-25 MCG/ACT AEPB Inhale 1 puff into the lungs daily.   HORIZANT 300 MG TBCR Take 1 tablet by mouth at bedtime.   Multiple Vitamin (MULTIVITAMIN) tablet Take 1 tablet by mouth daily.   nicotine (NICOTROL) 10 MG inhaler INHALE 1 CARTRIDGE (1 CONTINUOUS PUFFING TOTAL) INTO THE LUNGS AS NEEDED FOR SMOKING CESSATION.   nortriptyline (PAMELOR) 50 MG capsule Take 1 capsule (50 mg total) by mouth at bedtime.   oxyCODONE-acetaminophen (PERCOCET/ROXICET) 5-325 MG tablet Take 1 tablet by mouth at bedtime.   Probiotic Product (PROBIOTIC PO) Take by mouth.   tiZANidine (ZANAFLEX) 2 MG tablet Take 2 tablets by mouth at bedtime. Takes as needed   Current Facility-Administered Medications for the 10/21/22 encounter (Office Visit) with Tyler Pita, MD  Medication   0.9 %  sodium chloride infusion        Objective:   Physical Exam BP 118/78 (BP Location: Left Arm, Cuff Size: Normal)   Pulse 100   Temp 98.2 F (36.8 C)   Ht 5' 7.5" (1.715 m)   Wt 153 lb 3.2 oz (69.5 kg)   SpO2 96%   BMI 23.64 kg/m   SpO2: 96 % O2 Device: None (Room air)  GENERAL: Well-developed, well-nourished gentleman, no acute distress. HEAD: Normocephalic, atraumatic.  EYES: Pupils equal, round, reactive to light.  No scleral icterus.  MOUTH: Nose/mouth/throat not examined due to institutional masking requirements. NECK: Supple. No thyromegaly. Trachea midline. No JVD.  No adenopathy. PULMONARY: Good air entry bilaterally.  Coarse otherwise, no adventitious sounds. CARDIOVASCULAR: S1 and S2. Regular rate and rhythm.  No rubs, murmurs or gallops heard. ABDOMEN: Benign. MUSCULOSKELETAL: No joint deformity, no clubbing, no edema.  NEUROLOGIC:  No overt focal deficit, no gait disturbance, speech is fluent. SKIN: Intact,warm,dry. PSYCH: Mood and behavior normal.      Assessment & Plan:     ICD-10-CM   1. COPD with asthma  J44.89    Continue Trelegy Ellipta 100, 1 inhaler daily Continue as needed albuterol STOP SMOKING!    2. Centrilobular emphysema ([place])  J43.2    Patient needs to quit smoking Moderate obstruction    3. Alpha-1-antitrypsin deficiency carrier  Z14.8    Phenotype MS Level 155 mg/dL    4. Tobacco dependence due to cigarettes  F17.210    Patient counseled extensively regards to discontinuation of smoking Total counseling time 5 to 8 minutes Enrolled in lung cancer screening program     Meds ordered this encounter  Medications   Fluticasone-Umeclidin-Vilant (TRELEGY ELLIPTA) 100-62.5-25 MCG/ACT AEPB    Sig: Inhale 1 puff into the lungs daily.    Dispense:  28 each    Refill:  0    Order Specific Question:   Lot Number?  Answer:   [id number]    Order Specific Question:   Expiration Date?    Answer:   01/08/2024    Order Specific Question:   Quantity    Answer:   2   Patient will continue Trelegy for now.  He is well compensated on this medication.  Will proceed with assistance program for the patient.  He was provided samples today.  We will see him in follow-up in 4 to 6 weeks time call sooner should any new problems arise.  Renold Don, MD Advanced Bronchoscopy PCCM [facility]    *This note was dictated using voice recognition software/Dragon.  Despite best efforts to proofread, errors can occur which can change the meaning. Any transcriptional errors that result from this process are unintentional and may not be fully corrected at the time of dictation.

## 2022-10-21 NOTE — Patient Instructions (Addendum)
We are providing you with a form that you can fill out this may help [place] get the Trelegy for you at no cost.  We provided you with samples of the Trelegy today.  We will see you in follow-up in 4 to 6 weeks time call sooner should any new problems arise.

## 2022-10-22 ENCOUNTER — Encounter: Payer: Self-pay | Admitting: Pulmonary Disease

## 2022-10-31 ENCOUNTER — Emergency Department: Payer: Medicare PPO

## 2022-10-31 ENCOUNTER — Emergency Department
Admission: EM | Admit: 2022-10-31 | Discharge: 2022-10-31 | Disposition: A | Discharge status: Home / Self Care | Payer: Medicare PPO | Attending: Emergency Medicine | Admitting: Emergency Medicine

## 2022-10-31 ENCOUNTER — Other Ambulatory Visit: Payer: Self-pay

## 2022-10-31 DIAGNOSIS — R079 Chest pain, unspecified: Secondary | ICD-10-CM | POA: Insufficient documentation

## 2022-10-31 DIAGNOSIS — N200 Calculus of kidney: Secondary | ICD-10-CM | POA: Diagnosis not present

## 2022-10-31 DIAGNOSIS — J45909 Unspecified asthma, uncomplicated: Secondary | ICD-10-CM | POA: Insufficient documentation

## 2022-10-31 DIAGNOSIS — R109 Unspecified abdominal pain: Secondary | ICD-10-CM | POA: Insufficient documentation

## 2022-10-31 DIAGNOSIS — R1011 Right upper quadrant pain: Secondary | ICD-10-CM | POA: Diagnosis not present

## 2022-10-31 DIAGNOSIS — K824 Cholesterolosis of gallbladder: Secondary | ICD-10-CM | POA: Diagnosis not present

## 2022-10-31 DIAGNOSIS — J439 Emphysema, unspecified: Secondary | ICD-10-CM | POA: Diagnosis not present

## 2022-10-31 DIAGNOSIS — Z1152 Encounter for screening for COVID-19: Secondary | ICD-10-CM | POA: Diagnosis not present

## 2022-10-31 DIAGNOSIS — D72829 Elevated white blood cell count, unspecified: Secondary | ICD-10-CM | POA: Insufficient documentation

## 2022-10-31 DIAGNOSIS — R0602 Shortness of breath: Secondary | ICD-10-CM | POA: Insufficient documentation

## 2022-10-31 DIAGNOSIS — R1013 Epigastric pain: Secondary | ICD-10-CM | POA: Diagnosis not present

## 2022-10-31 DIAGNOSIS — R0789 Other chest pain: Secondary | ICD-10-CM | POA: Diagnosis not present

## 2022-10-31 LAB — RESP PANEL BY RT-PCR (RSV, FLU A&B, COVID)  RVPGX2
Influenza A by PCR: NEGATIVE
Influenza B by PCR: NEGATIVE
Resp Syncytial Virus by PCR: NEGATIVE
SARS Coronavirus 2 by RT PCR: NEGATIVE

## 2022-10-31 LAB — HEPATIC FUNCTION PANEL
ALT: 25 U/L (ref 0–44)
AST: 18 U/L (ref 15–41)
Albumin: 4 g/dL (ref 3.5–5.0)
Alkaline Phosphatase: 112 U/L (ref 38–126)
Bilirubin, Direct: <0.1 mg/dL (ref 0.0–0.2)
Total Bilirubin: 0.4 mg/dL (ref 0.3–1.2)
Total Protein: 7.5 g/dL (ref 6.5–8.1)

## 2022-10-31 LAB — LIPASE, BLOOD: Lipase: 33 U/L (ref 11–51)

## 2022-10-31 LAB — BASIC METABOLIC PANEL
Anion gap: 6 (ref 5–15)
BUN: 11 mg/dL (ref 8–23)
CO2: 25 mmol/L (ref 22–32)
Calcium: 9.3 mg/dL (ref 8.9–10.3)
Chloride: 104 mmol/L (ref 98–111)
Creatinine, Ser: 0.9 mg/dL (ref 0.61–1.24)
GFR, Estimated: >60 mL/min (ref >60)
Glucose, Bld: 103 mg/dL — ABNORMAL HIGH (ref 70–99)
Potassium: 3.4 mmol/L — ABNORMAL LOW (ref 3.5–5.1)
Sodium: 135 mmol/L (ref 135–145)

## 2022-10-31 LAB — CBC
HCT: 48.3 % (ref 39.0–52.0)
Hemoglobin: 15.9 g/dL (ref 13.0–17.0)
MCH: 31.3 pg (ref 26.0–34.0)
MCHC: 32.9 g/dL (ref 30.0–36.0)
MCV: 95.1 fL (ref 80.0–100.0)
Platelets: 278 10*3/uL (ref 150–400)
RBC: 5.08 MIL/uL (ref 4.22–5.81)
RDW: 13 % (ref 11.5–15.5)
WBC: 11.1 10*3/uL — ABNORMAL HIGH (ref 4.0–10.5)
nRBC: 0 % (ref 0.0–0.2)

## 2022-10-31 LAB — TROPONIN I (HIGH SENSITIVITY)
Troponin I (High Sensitivity): 3 ng/L (ref <18)
Troponin I (High Sensitivity): 4 ng/L (ref <18)

## 2022-10-31 MED ORDER — FENTANYL CITRATE PF 50 MCG/ML IJ SOSY
50.0000 ug | PREFILLED_SYRINGE | Freq: Once | INTRAMUSCULAR | Status: AC
  Administered 2022-10-31: 50 ug via INTRAVENOUS
  Filled 2022-10-31: qty 1

## 2022-10-31 MED ORDER — IOHEXOL 350 MG/ML SOLN
100.0000 mL | Freq: Once | INTRAVENOUS | Status: AC | PRN
  Administered 2022-10-31: 100 mL via INTRAVENOUS

## 2022-10-31 MED ORDER — POTASSIUM CHLORIDE CRYS ER 20 MEQ PO TBCR
40.0000 meq | EXTENDED_RELEASE_TABLET | Freq: Once | ORAL | Status: AC
  Administered 2022-10-31: 40 meq via ORAL
  Filled 2022-10-31: qty 2

## 2022-10-31 MED ORDER — ONDANSETRON HCL 4 MG/2ML IJ SOLN
4.0000 mg | Freq: Once | INTRAMUSCULAR | Status: AC
  Administered 2022-10-31: 4 mg via INTRAVENOUS
  Filled 2022-10-31: qty 2

## 2022-10-31 MED ORDER — ALUM & MAG HYDROXIDE-SIMETH 200-200-20 MG/5ML PO SUSP
30.0000 mL | Freq: Once | ORAL | Status: AC
  Administered 2022-10-31: 30 mL via ORAL
  Filled 2022-10-31: qty 30

## 2022-10-31 MED ORDER — PANTOPRAZOLE SODIUM 40 MG IV SOLR
40.0000 mg | Freq: Once | INTRAVENOUS | Status: AC
  Administered 2022-10-31: 40 mg via INTRAVENOUS
  Filled 2022-10-31: qty 10

## 2022-10-31 NOTE — Discharge Instructions (Addendum)
Continue to take dexilant and call your gastroenterologist for follow-up appointment.  For your chest pain I made a referral to cardiology who will call you to schedule an appointment.  Thank you for choosing [place] for your health care today!  Please see your primary doctor this week for a follow up appointment.   Sometimes, in the early stages of certain disease courses it is difficult to detect in the emergency department evaluation -- so, it is important that you continue to monitor your symptoms and call your doctor right away or return to the emergency department if you develop any new or worsening symptoms.  Please go to the following website to schedule new (and existing) patient appointments:   [email or web address]  If you do not have a primary doctor try calling the following clinics to establish care:  If you have insurance:  [facility] [phone] [place] [address]   [facility]  [phone] [place] [place] [address]   If you do not have insurance:  [facility]  [phone] [address], [place] [address]   The following is another list of primary care offices in the area who are accepting new patients at this time.  Please reach out to one of them directly and let them know you would like to schedule an appointment to follow up on an Emergency Department visit, and/or to establish a new primary care provider (PCP).  There are likely other primary care clinics in the are who are accepting new patients, but this is an excellent place to start:  [place] physician: Dr Lavon Paganini [address] [place], [place] [address] [phone]  [facility] Lead Physician: Dr Steele Sizer [address], [place], [place] [address] [phone]  [place] Physician: Dr Park Liter [address] [place], [place] [address] [phone]  [facility] Lead Physician: Dr Dewaine Oats [place], [place], [place] [address] [phone]  [facility] Physician: Dr Halina Maidens [address] [place] [address] [place], [place] [address] [phone]   It was my pleasure to care for you today.   Hoover Brunette Jacelyn Grip, MD

## 2022-10-31 NOTE — ED Provider Notes (Signed)
[facility] Provider Note    Event Date/Time   First MD Initiated Contact with Patient 10/31/22 1417     (approximate)   History   Chest Pain   HPI  Michael [name] is a 62 y.o. male with history of asthma who comes in with chest pain and shortness of breath.  Patient reports symptoms started last night felt more like indigestion but he currently reports a pain in his middle chest down into his upper abdomen pain is worse with taking a deep breath.  He denies any history of blood thinners.  He does report some shortness of breath associated with it.  Denies any fevers, cough.  Denies any falls hitting his head.  Denies this ever happening previously.  Has been taking his acid reducers without any relief in symptoms.  Currently reports that pain is an 8 out of 10.   Physical Exam   Triage Vital Signs: ED Triage Vitals  Enc Vitals Group     BP 10/31/22 1355 (!) 112/101     Pulse Rate 10/31/22 1355 100     Resp 10/31/22 1355 20     Temp 10/31/22 1355 97.9 F (36.6 C)     Temp src --      SpO2 10/31/22 1355 97 %     Weight 10/31/22 1356 164 lb (74.4 kg)     Height 10/31/22 1356 5\' 7"  (1.702 m)     Head Circumference --      Peak Flow --      Pain Score 10/31/22 1353 8     Pain Loc --      Pain Edu? --      Excl. in [place]? --     Most recent vital signs: Vitals:   10/31/22 1355  BP: (!) 112/101  Pulse: 100  Resp: 20  Temp: 97.9 F (36.6 C)  SpO2: 97%     General: Awake, no distress.  CV:  Good peripheral perfusion.  Resp:  Normal effort.  Clear lungs, no wheezing Abd:  No distention.  Other:  Tender in his upper abdomen.   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  RADIOLOGY I have reviewed the xray personally and and interpreted no pneumonia  PROCEDURES:  Critical Care performed: No  .1-3 Lead EKG Interpretation  Performed by: Vanessa Lyons Switch, MD Authorized by: Vanessa Sanpete, MD     Interpretation: normal     ECG rate:  90   ECG rate assessment: normal     Rhythm: sinus rhythm     Ectopy: none     Conduction: normal      MEDICATIONS ORDERED IN ED: Medications  fentaNYL (SUBLIMAZE) injection 50 mcg (has no administration in time range)  ondansetron (ZOFRAN) injection 4 mg (has no administration in time range)  alum & mag hydroxide-simeth (MAALOX/MYLANTA) 200-200-20 MG/5ML suspension 30 mL (has no administration in time range)  pantoprazole (PROTONIX) injection 40 mg (  has no administration in time range)     IMPRESSION / MDM / ASSESSMENT AND PLAN / ED COURSE  I reviewed the triage vital signs and the nursing notes.   Patient's presentation is most consistent with acute presentation with potential threat to life or bodily function.   Patient comes in with some pleuritic chest pain as well as abdominal pain.  Do not hear any wheezing to suggest that this is related to his COPD.  Labs were ordered to evaluate for ACS, COVID, flu, chest x-ray to evaluate for pneumonia,  BMP shows slightly low potassium.  CBC stable slightly elevated white count.  Initial troponin was negative.  Discussed with patient we will get CT imaging to further evaluate as well as ultrasound of his gallbladder given his pain is difficult to pinpoint exactly where it is at and patient will be handed off to oncoming team pending these results  Patient handed off pending these results   The patient is on the cardiac monitor to evaluate for  evidence of arrhythmia and/or significant heart rate changes.      FINAL CLINICAL IMPRESSION(S) / ED DIAGNOSES   Final diagnoses:  Chest pain, unspecified type     Rx / DC Orders   ED Discharge Orders     None        Note:  This document was prepared using Dragon voice recognition software and may include unintentional dictation errors.   Vanessa [place], MD 10/31/22 [phone]

## 2022-10-31 NOTE — ED Notes (Signed)
US at bedside

## 2022-10-31 NOTE — ED Triage Notes (Signed)
Pt brought in due to chest pain and shortness of breath. As per family, pt started with indigestion last night and now has chest pain. Pt denies any cardiac problems. Pt denies being on blood thinners.

## 2022-10-31 NOTE — ED Provider Notes (Signed)
I was asked to follow-up on reading and repeat troponin for this patient which were negative   He has been stable in the emergency department.  I spoke with both him and his spouse about discharge plan, he will continue his PPI and call his gastroenterologist for follow-up.  He has no cardiologist admitted referral to cardiologist.  He understands to return to the emergency department any new or worsening unexpected symptoms.   Lucillie Garfinkel, MD 10/31/22 [phone]

## 2022-11-01 ENCOUNTER — Telehealth: Payer: Self-pay | Admitting: Gastroenterology

## 2022-11-01 NOTE — Telephone Encounter (Signed)
Inbound call from patient spouse. States patient was admitted into the ed Saturday due to him having chest pain and intergestion. States ed told patient to schedule appointment with his gastro provider. Offered next available but patient would like to be seen sooner. Requesting to speak with a nurse. Please advise.

## 2022-11-01 NOTE — Telephone Encounter (Signed)
Pt scheduled to see Dr. Fuller Plan 11/23/22 at 3:20pm. Pt aware of appt.

## 2022-11-02 ENCOUNTER — Encounter: Payer: Self-pay | Admitting: Family Medicine

## 2022-11-02 ENCOUNTER — Ambulatory Visit (INDEPENDENT_AMBULATORY_CARE_PROVIDER_SITE_OTHER)
Admission: RE | Admit: 2022-11-02 | Discharge: 2022-11-02 | Disposition: A | Discharge status: Home / Self Care | Payer: Medicare PPO | Source: Ambulatory Visit | Attending: Family Medicine | Admitting: Family Medicine

## 2022-11-02 ENCOUNTER — Ambulatory Visit: Payer: Medicare PPO | Admitting: Family Medicine

## 2022-11-02 VITALS — BP 124/84 | HR 115 | Temp 97.8°F | Ht 67.0 in | Wt 152.0 lb

## 2022-11-02 DIAGNOSIS — K5903 Drug induced constipation: Secondary | ICD-10-CM | POA: Diagnosis not present

## 2022-11-02 DIAGNOSIS — R1084 Generalized abdominal pain: Secondary | ICD-10-CM

## 2022-11-02 DIAGNOSIS — K2100 Gastro-esophageal reflux disease with esophagitis, without bleeding: Secondary | ICD-10-CM | POA: Diagnosis not present

## 2022-11-02 DIAGNOSIS — F17200 Nicotine dependence, unspecified, uncomplicated: Secondary | ICD-10-CM

## 2022-11-02 DIAGNOSIS — R109 Unspecified abdominal pain: Secondary | ICD-10-CM | POA: Diagnosis not present

## 2022-11-02 LAB — POC URINALSYSI DIPSTICK (AUTOMATED)
Bilirubin, UA: NEGATIVE
Blood, UA: NEGATIVE
Glucose, UA: NEGATIVE
Ketones, UA: NEGATIVE
Leukocytes, UA: NEGATIVE
Nitrite, UA: NEGATIVE
Protein, UA: NEGATIVE
Spec Grav, UA: 1.015 (ref 1.010–1.025)
Urobilinogen, UA: 0.2 E.U./dL
pH, UA: 6 (ref 5.0–8.0)

## 2022-11-02 MED ORDER — FAMOTIDINE 20 MG PO TABS: 20.0000 mg | ORAL_TABLET | Freq: Every day | ORAL | Status: DC

## 2022-11-02 MED ORDER — SUCRALFATE 1 G PO TABS: 1.0000 g | ORAL_TABLET | Freq: Three times a day (TID) | ORAL | 0 refills | Status: DC

## 2022-11-02 NOTE — Progress Notes (Signed)
Patient ID: Michael [name], male    DOB: 06-17-1961, 62 y.o.   MRN: [id number]  This visit was conducted in person.  BP 124/84   Pulse (!) 115   Temp 97.8 F (36.6 C) (Temporal)   Ht 5\' 7"  (1.702 m)   Wt 152 lb (68.9 kg)   SpO2 95%   BMI 23.81 kg/m    CC: ER f/u visit Subjective:   HPI: Michael [name] is a 62 y.o. male presenting on 11/02/2022 for Hospitalization Follow-up (Seen on 10/31/22 at [facility] ED, dx chest pain. Pt accompanied by wife, [name]. )   Recent ER visit on Sunday for chest pain and shortness of breath. Symptoms initially suspicious for indigestion with upper abdominal discomfort but also pleuritic pain. No improvement despite dexilant 60mg  daily. ER records reviewed. BP was elevated at 112/101. Treated with fentanyl, zofran, maalox, and pantoprazole. Tested negative for ACS with TnIx2, COVID, flu, CXR clear besides known emphysema. RUQ [place] unrevealing (fatty liver changes) as was chest CTA, abd/pelvis CT. WBC slightly elevated at 11.1, lipase normal at 33.   Initial indigestion/gerd acid reflux (after eating spicy food (chili beans, taco [employer]) for a few days) that progressed. Trouble moving bowels - despite feeling to have to go. Last BM  yesterday - very small amount. Passing gas ok - started yesterday. + abdominal cramping. Describes burning epigastric pain, feels better when supine.  Takes tums at night, feels this may cause constipation.   No NSAID use No alcohol intake Does drink caffeine daily.   No fevers/chills, dysuria, urgency, urgency, hematuria.  No blood in stool, nausea/vomiting.   He did receive ESI cervical injection on 10/26/2022. Symptoms started after this. Normally receives steroid injections Q3 months. Has been staying with father who is on dialysis.  May have had similar episode 2021 after cervical ESI associated with constipation at the time treated with linzess - hasn't tried this yet.  Not using trelegy regularly.  He is taking Horizant  (gabapentin) 300mg  nightly, nortriptyline 50mg  nightly, oxycodone 5/325mg  1/2 tab nightly      Relevant past medical, surgical, family and social history reviewed and updated as indicated. Interim medical history since our last visit reviewed. Allergies and medications reviewed and updated. Outpatient Medications Prior to Visit  Medication Sig Dispense Refill   albuterol (VENTOLIN HFA) 108 (90 Base) MCG/ACT inhaler Inhale 2 puffs into the lungs every 6 (six) hours as needed. 8 g 2   Bioflavonoid Products (ESTER C PO) Take by mouth daily.     Cholecalciferol (VITAMIN D3 PO) Take by mouth.     cyanocobalamin 100 MCG tablet Take 100 mcg by mouth daily.      dexlansoprazole (DEXILANT) 60 MG capsule TAKE 1 CAPSULE BY MOUTH EVERY DAY 90 capsule 3   fish oil-omega-3 fatty acids 1000 MG capsule Take 2 g by mouth daily.     fluticasone (FLONASE) 50 MCG/ACT nasal spray SPRAY 2 SPRAYS INTO EACH NOSTRIL EVERY DAY 48 mL 1   Fluticasone-Umeclidin-Vilant (TRELEGY ELLIPTA) 100-62.5-25 MCG/ACT AEPB Inhale 1 puff into the lungs daily. 28 each 11   Fluticasone-Umeclidin-Vilant (TRELEGY ELLIPTA) 100-62.5-25 MCG/ACT AEPB Inhale 1 puff into the lungs daily. 28 each 0   HORIZANT 300 MG TBCR Take 1 tablet by mouth at bedtime.     Multiple Vitamin (MULTIVITAMIN) tablet Take 1 tablet by mouth daily.     nicotine (NICOTROL) 10 MG inhaler INHALE 1 CARTRIDGE (1 CONTINUOUS PUFFING TOTAL) INTO THE LUNGS AS NEEDED FOR SMOKING CESSATION. Sherrill  each 0   nortriptyline (PAMELOR) 50 MG capsule Take 1 capsule (50 mg total) by mouth at bedtime. 90 capsule 3   oxyCODONE-acetaminophen (PERCOCET/ROXICET) 5-325 MG tablet Take 1 tablet by mouth at bedtime.     Probiotic Product (PROBIOTIC PO) Take by mouth.     tiZANidine (ZANAFLEX) 2 MG tablet Take 2 tablets by mouth at bedtime. Takes as needed     Zinc 30 MG TABS Take 1 tablet by mouth daily.     0.9 %  sodium chloride infusion      No facility-administered medications prior to  visit.     Per HPI unless specifically indicated in ROS section below Review of Systems  Objective:  BP 124/84   Pulse (!) 115   Temp 97.8 F (36.6 C) (Temporal)   Ht 5\' 7"  (1.702 m)   Wt 152 lb (68.9 kg)   SpO2 95%   BMI 23.81 kg/m   Wt Readings from Last 3 Encounters:  11/02/22 152 lb (68.9 kg)  10/31/22 164 lb (74.4 kg)  10/21/22 153 lb 3.2 oz (69.5 kg)      Physical Exam Vitals and nursing note reviewed.  Constitutional:      Appearance: Normal appearance. He is not ill-appearing.  HENT:     Mouth/Throat:     Mouth: Mucous membranes are moist.     Pharynx: Oropharynx is clear. No oropharyngeal exudate or posterior oropharyngeal erythema.  Eyes:     Extraocular Movements: Extraocular movements intact.     Pupils: Pupils are equal, round, and reactive to light.  Cardiovascular:     Rate and Rhythm: Regular rhythm. Tachycardia present.     Pulses: Normal pulses.     Heart sounds: Normal heart sounds. No murmur heard. Pulmonary:     Effort: Pulmonary effort is normal. No respiratory distress.     Breath sounds: Normal breath sounds. No wheezing, rhonchi or rales.  Abdominal:     General: Bowel sounds are normal. There is no distension.     Palpations: Abdomen is soft. There is no mass.     Tenderness: There is abdominal tenderness (moderate) in the epigastric area. There is no right CVA tenderness, left CVA tenderness, guarding or rebound. Negative signs include Murphy's sign.     Hernia: No hernia is present.  Musculoskeletal:        General: Normal range of motion.     Right lower leg: No edema.     Left lower leg: No edema.  Skin:    Findings: No rash.  Neurological:     Mental Status: He is alert.  Psychiatric:        Mood and Affect: Mood normal.        Behavior: Behavior normal.       Results for orders placed or performed in visit on 11/02/22  POCT Urinalysis Dipstick (Automated)  Result Value Ref Range   Color, UA yellow    Clarity, UA clear     Glucose, UA Negative Negative   Bilirubin, UA negative    Ketones, UA negative    Spec Grav, UA 1.015 1.010 - 1.025   Blood, UA negative    pH, UA 6.0 5.0 - 8.0   Protein, UA Negative Negative   Urobilinogen, UA 0.2 0.2 or 1.0 E.U./dL   Nitrite, UA negative    Leukocytes, UA Negative Negative   Lab Results  Component Value Date   CREATININE 0.90 10/31/2022   BUN 11 10/31/2022   NA 135 10/31/2022  K 3.4 (L) 10/31/2022   CL 104 10/31/2022   CO2 25 10/31/2022    Lab Results  Component Value Date   ALT 25 10/31/2022   AST 18 10/31/2022   ALKPHOS 112 10/31/2022   BILITOT 0.4 10/31/2022    Lab Results  Component Value Date   WBC 11.1 (H) 10/31/2022   HGB 15.9 10/31/2022   HCT 48.3 10/31/2022   MCV 95.1 10/31/2022   PLT 278 10/31/2022    DG Abd 2 Views CLINICAL DATA:  Pain.  Constipation.  EXAM: ABDOMEN - 2 VIEW  COMPARISON:  None Available.  FINDINGS: Moderate fecal loading throughout the colon. A rounded density projects over the right kidney on only one view, possibly bowel contents. No bowel obstruction. No other bony or soft tissue abnormalities are identified to explain the patient's symptoms. Degenerative changes in the left SI joint with an osteophyte.  IMPRESSION: 1. Moderate fecal loading throughout the colon. 2. A rounded density projects over the right kidney on only one view, possibly bowel contents. 3. Degenerative changes in the left SI joint.  Electronically Signed   By: Dorise Bullion III M.D.   On: 11/02/2022 17:34   Assessment & Plan:   Problem List Items Addressed This Visit     TOBACCO ABUSE    Continued smoker >1/2 ppd - encouraged cessation.       GERD    H/o esophagitis, gastritis, duodenitis and HH on remote EGD 2004. On Dexilant 60mg  daily - add carafate and pepcid. Pending GI eval      Relevant Medications   sucralfate (CARAFATE) 1 g tablet   famotidine (PEPCID) 20 MG tablet   Constipation due to pain medication    Rec  start miralax.       Generalized abdominal pain - Primary    ER records reviewed.  Predominant symptoms suspicious for gastritis/PUD precipitated by recent diet along with constipation.  Check abd xray r/o obstruction, evaluate stool burden.  UA today normal.  Continue dexilant, add carafate for a few weeks with pepcid nightly. Add miralax PRN constipation.  Update if not improving for further evaluation.  Noted tachycardia thought due to discomfort.       Relevant Orders   DG Abd 2 Views (Completed)   POCT Urinalysis Dipstick (Automated) (Completed)     Meds ordered this encounter  Medications   sucralfate (CARAFATE) 1 g tablet    Sig: Take 1 tablet (1 g total) by mouth 4 (four) times daily -  with meals and at bedtime.    Dispense:  60 tablet    Refill:  0   famotidine (PEPCID) 20 MG tablet    Sig: Take 1 tablet (20 mg total) by mouth at bedtime.    Orders Placed This Encounter  Procedures   DG Abd 2 Views    Standing Status:   Future    Number of Occurrences:   1    Standing Expiration Date:   11/02/2023    Order Specific Question:   Reason for Exam (SYMPTOM  OR DIAGNOSIS REQUIRED)    Answer:   abd pain, constipation    Order Specific Question:   Preferred imaging location?    Answer:   [facility]   POCT Urinalysis Dipstick (Automated)    Patient Instructions  Urinalysis today  Abdominal xray Start miralax 1 capful daily with 8 oz fluids. Start carafate 1 tablet daily with meals and at bedtime as needed Continue dexilant  Start pepcid 20mg  nightly  Keep  appointment with GI.   Follow up plan: Return if symptoms worsen or fail to improve.  Ria Bush, MD

## 2022-11-02 NOTE — Patient Instructions (Addendum)
Urinalysis today  Abdominal xray Start miralax 1 capful daily with 8 oz fluids. Start carafate 1 tablet daily with meals and at bedtime as needed Continue dexilant  Start pepcid 20mg  nightly  Keep appointment with GI.

## 2022-11-03 ENCOUNTER — Telehealth: Payer: Self-pay | Admitting: Gastroenterology

## 2022-11-03 DIAGNOSIS — R1084 Generalized abdominal pain: Secondary | ICD-10-CM | POA: Insufficient documentation

## 2022-11-03 NOTE — Telephone Encounter (Signed)
PT wife is calling to find out what she can do for his pain.

## 2022-11-03 NOTE — Assessment & Plan Note (Addendum)
ER records reviewed.  Predominant symptoms suspicious for gastritis/PUD precipitated by recent diet along with constipation.  Check abd xray r/o obstruction, evaluate stool burden.  UA today normal.  Continue dexilant, add carafate for a few weeks with pepcid nightly. Add miralax PRN constipation.  Update if not improving for further evaluation.  Noted tachycardia thought due to discomfort.

## 2022-11-03 NOTE — Assessment & Plan Note (Signed)
Continued smoker >1/2 ppd - encouraged cessation.

## 2022-11-04 NOTE — Telephone Encounter (Signed)
Patient has been rescheduled to 11/08/22 with Dr. Fuller Plan at 11:30.  Wife reports continued constipation, he has not been taking the Miralax as recommended by his PCP.  Wife is advised to have him start Miralax 1-2 capfuls a day.

## 2022-11-06 NOTE — Assessment & Plan Note (Signed)
Rec start miralax.

## 2022-11-06 NOTE — Assessment & Plan Note (Signed)
H/o esophagitis, gastritis, duodenitis and HH on remote EGD 2004. On Dexilant 60mg  daily - add carafate and pepcid. Pending GI eval

## 2022-11-08 ENCOUNTER — Encounter: Payer: Self-pay | Admitting: *Deleted

## 2022-11-08 ENCOUNTER — Ambulatory Visit: Payer: Medicare PPO | Admitting: Gastroenterology

## 2022-11-08 VITALS — BP 110/70 | HR 112 | Ht 67.0 in | Wt 150.0 lb

## 2022-11-08 DIAGNOSIS — K5903 Drug induced constipation: Secondary | ICD-10-CM

## 2022-11-08 DIAGNOSIS — K219 Gastro-esophageal reflux disease without esophagitis: Secondary | ICD-10-CM

## 2022-11-08 DIAGNOSIS — K760 Fatty (change of) liver, not elsewhere classified: Secondary | ICD-10-CM | POA: Diagnosis not present

## 2022-11-08 NOTE — Progress Notes (Signed)
Assessment     GERD Constipation due to pain medications Personal history of adenomatous colon polyps Hepatic steatosis   Recommendations    Continue Dexilant 60 mg daily, Tums or Mylanta as needed and follow antireflux measures Begin MiraLAX once or twice daily titrated for complete bowel movement daily.  If this is not effective begin Linzess 145 mcg daily. If not effective then contact us.  Discontinue sucralfate Surveillance colonoscopy recommended in August 2025 Carb modified, fat modified diet REV in 1 year   HPI    This is a 62 year old male he was evaluated in the ED on March 24 with chest pain, SOB and abdominal pain.  He is accompanied by his wife.  Imaging results below.  RUQ US showed fatty infiltration of liver.  CT AP was negative for abdominal findings.  Blood work was unremarkable except for a WBC of 11.1.  Above symptoms started a couple days after cervical spine injection for pain management.  His reflux symptoms are under good control.  He continues to struggle with constipation.  Linzess was previously prescribed but he did not try it.  He took magnesium citrate last week with good results.  He is having small bowel movements and feels he is not completely evacuating.  EGD June 2020   Colonoscopy Aug 2022    Labs / Imaging       Latest Ref Rng & Units 10/31/2022    2:46 PM 12/22/2021    7:56 AM 12/17/2020    8:04 AM  Hepatic Function  Total Protein 6.5 - 8.1 g/dL 7.5  6.8  6.7   Albumin 3.5 - 5.0 g/dL 4.0  4.3  4.3   AST 15 - 41 U/L 18  14  19    ALT 0 - 44 U/L 25  18  19    Alk Phosphatase 38 - 126 U/L 112  109  105   Total Bilirubin 0.3 - 1.2 mg/dL 0.4  0.5  0.5   Bilirubin, Direct 0.0 - 0.2 mg/dL <0.1          Latest Ref Rng & Units 10/31/2022    1:58 PM 02/02/2013    8:27 AM 04/21/2010    8:53 AM  CBC  WBC 4.0 - 10.5 K/uL 11.1  11.2  8.6   Hemoglobin 13.0 - 17.0 g/dL 15.9  15.0  15.0   Hematocrit 39.0 - 52.0 % 48.3  44.0  43.8   Platelets  150 - 400 K/uL 278  265.0  257.0      DG Abd 2 Views  Result Date: 11/02/2022 CLINICAL DATA:  Pain.  Constipation. EXAM: ABDOMEN - 2 VIEW COMPARISON:  None Available. FINDINGS: Moderate fecal loading throughout the colon. A rounded density projects over the right kidney on only one view, possibly bowel contents. No bowel obstruction. No other bony or soft tissue abnormalities are identified to explain the patient's symptoms. Degenerative changes in the left SI joint with an osteophyte. IMPRESSION: 1. Moderate fecal loading throughout the colon. 2. A rounded density projects over the right kidney on only one view, possibly bowel contents. 3. Degenerative changes in the left SI joint. Electronically Signed   By: Dorise Bullion III M.D.   On: 11/02/2022 17:34   CT ABDOMEN PELVIS W CONTRAST  Result Date: 10/31/2022 CLINICAL DATA:  Acute abdominal pain.  Indigestion. EXAM: CT ABDOMEN AND PELVIS WITH CONTRAST TECHNIQUE: Multidetector CT imaging of the abdomen and pelvis was performed using the standard protocol following bolus administration of  intravenous contrast. RADIATION DOSE REDUCTION: This exam was performed according to the departmental dose-optimization program which includes automated exposure control, adjustment of the mA and/or kV according to patient size and/or use of iterative reconstruction technique. CONTRAST:  151mL OMNIPAQUE IOHEXOL 350 MG/ML SOLN COMPARISON:  None Available. FINDINGS: Lower Chest: No acute findings. Hepatobiliary: No hepatic masses identified. Gallbladder is unremarkable. No evidence of biliary ductal dilatation. Pancreas:  No mass or inflammatory changes. Spleen: Within normal limits in size and appearance. Adrenals/Urinary Tract: No suspicious masses identified. 3 mm calculus noted in lower pole of right kidney. No evidence of ureteral calculi or hydronephrosis. Stomach/Bowel: No evidence of obstruction, inflammatory process or abnormal fluid collections. Normal appendix  visualized. Vascular/Lymphatic: No pathologically enlarged lymph nodes. No acute vascular findings. Reproductive:  No mass or other significant abnormality. Other:  None. Musculoskeletal:  No suspicious bone lesions identified. IMPRESSION: No acute findings within the abdomen or pelvis. 3 mm right renal calculus. No evidence of ureteral calculi or hydronephrosis. Electronically Signed   By: Marlaine Hind M.D.   On: 10/31/2022 16:54   CT Angio Chest PE W and/or Wo Contrast  Result Date: 10/31/2022 CLINICAL DATA:  Chest pain and shortness of breath. High probability for pulmonary embolism. EXAM: CT ANGIOGRAPHY CHEST WITH CONTRAST TECHNIQUE: Multidetector CT imaging of the chest was performed using the standard protocol during bolus administration of intravenous contrast. Multiplanar CT image reconstructions and MIPs were obtained to evaluate the vascular anatomy. RADIATION DOSE REDUCTION: This exam was performed according to the departmental dose-optimization program which includes automated exposure control, adjustment of the mA and/or kV according to patient size and/or use of iterative reconstruction technique. CONTRAST:  140mL OMNIPAQUE IOHEXOL 350 MG/ML SOLN COMPARISON:  09/15/2022 FINDINGS: Cardiovascular: Satisfactory opacification of pulmonary arteries noted, and no pulmonary emboli identified. No evidence of thoracic aortic dissection or aneurysm. Mediastinum/Nodes: No masses or pathologically enlarged lymph nodes identified. Lungs/Pleura: Moderate centrilobular and paraseptal emphysema again seen. No pulmonary mass, infiltrate, or effusion. Upper abdomen: No acute findings. Musculoskeletal: No suspicious bone lesions identified. Review of the MIP images confirms the above findings. IMPRESSION: No evidence of pulmonary embolism or other acute findings. Emphysema (ICD10-J43.9). Electronically Signed   By: Marlaine Hind M.D.   On: 10/31/2022 16:48   US ABDOMEN LIMITED RUQ (LIVER/GB)  Result Date:  10/31/2022 CLINICAL DATA:  RIGHT upper quadrant abdominal pain, epigastric pain, and chest pain for 1 day EXAM: ULTRASOUND ABDOMEN LIMITED RIGHT UPPER QUADRANT COMPARISON:  None Available. FINDINGS: Gallbladder: Normally distended without stones or wall thickening. 3 mm gallbladder polyp; no follow-up imaging recommended. No definite pericholecystic fluid or sonographic Murphy sign. Common bile duct: Diameter: 3 mm, normal Liver: Echogenic parenchyma, likely fatty infiltration though this can be seen with cirrhosis and certain infiltrative disorders. No hepatic mass or nodularity. No intrahepatic biliary dilatation. Portal vein is patent on color Doppler imaging with normal direction of blood flow towards the liver. Other: No RIGHT upper quadrant free fluid. IMPRESSION: Probable fatty infiltration of liver as above. Otherwise negative exam. Electronically Signed   By: Lavonia Dana M.D.   On: 10/31/2022 16:07   DG Chest 2 View  Result Date: 10/31/2022 CLINICAL DATA:  Chest pain and shortness of breath. EXAM: CHEST - 2 VIEW COMPARISON:  None Available. FINDINGS: The heart size and mediastinal contours are within normal limits. Pulmonary emphysema is demonstrated. No evidence of acute infiltrate or edema. No evidence of pleural effusion. Cervical spine fusion hardware incidentally noted. IMPRESSION: Emphysema.  No active cardiopulmonary disease. Electronically  Signed   By: Marlaine Hind M.D.   On: 10/31/2022 14:43    Current Medications, Allergies, Past Medical History, Past Surgical History, Family History and Social History were reviewed in [employer] record.   Physical Exam: General: Well developed, well nourished, no acute distress Head: Normocephalic and atraumatic Eyes: Sclerae anicteric, EOMI Ears: Normal auditory acuity Mouth: No deformities or lesions noted Lungs: Clear throughout to auscultation Heart: Regular rate and rhythm; No murmurs, rubs or bruits Abdomen: Soft,  non tender and non distended. No masses, hepatosplenomegaly or hernias noted. Normal Bowel sounds Rectal: Not done Musculoskeletal: Symmetrical with no gross deformities  Pulses:  Normal pulses noted Extremities: No edema or deformities noted Neurological: Alert oriented x 4, grossly nonfocal Psychological:  Alert and cooperative. Normal mood and affect   Cassidi Modesitt T. Fuller Plan, MD 11/08/2022, 10:57 AM

## 2022-11-08 NOTE — Patient Instructions (Signed)
Please look over and follow antireflux measure pamphlet given to you at your visit today.  Please purchase the following medications over the counter and take as directed: Miralax 17 grams (1 capful) dissolved in at least 8 ounces water/juice 1-2 times daily for at least a 2 week trial. If this is ineffective in helping your constipation, you may begin Linzess previously prescribed for you.  If the above measures are ineffective in helping constipation and reflux, please call our office.  Please follow upw with Dr Fuller Plan in 1 year.  _______________________________________________________  If your blood pressure at your visit was 140/90 or greater, please contact your primary care physician to follow up on this.  _______________________________________________________  If you are age 64 or older, your body mass index should be between 23-30. Your Body mass index is 23.49 kg/m. If this is out of the aforementioned range listed, please consider follow up with your Primary Care Provider.  If you are age 64 or younger, your body mass index should be between 19-25. Your Body mass index is 23.49 kg/m. If this is out of the aformentioned range listed, please consider follow up with your Primary Care Provider.   ________________________________________________________  The [facility] providers would like to encourage you to use [facility] to communicate with providers for non-urgent requests or questions.  Due to long hold times on the telephone, sending your provider a message by [facility] may be a faster and more efficient way to get a response.  Please allow 48 business hours for a response.  Please remember that this is for non-urgent requests.  _______________________________________________________  Due to recent changes in healthcare laws, you may see the results of your imaging and laboratory studies on MyChart before your provider has had a chance to review them.  We understand that in some cases  there may be results that are confusing or concerning to you. Not all laboratory results come back in the same time frame and the provider may be waiting for multiple results in order to interpret others.  Please give [place] 48 hours in order for your provider to thoroughly review all the results before contacting the office for clarification of your results.

## 2022-11-09 ENCOUNTER — Other Ambulatory Visit: Payer: Self-pay | Admitting: Family Medicine

## 2022-11-09 NOTE — Telephone Encounter (Signed)
Message from pharmacy:  [place]el[place].   Sucralfate Last filled:  11/02/22, #1 g Last OV: 11/02/22, hosp f/u Next OV:  none

## 2022-11-12 ENCOUNTER — Ambulatory Visit: Payer: Medicare PPO | Admitting: Cardiovascular Disease

## 2022-11-23 ENCOUNTER — Ambulatory Visit: Payer: Medicare PPO | Admitting: Gastroenterology

## 2022-11-30 ENCOUNTER — Other Ambulatory Visit: Payer: Self-pay | Admitting: Family Medicine

## 2022-12-01 NOTE — Telephone Encounter (Signed)
Patient scheduled.

## 2022-12-01 NOTE — Telephone Encounter (Signed)
E-scribed refill  Plz scheduled CPE and fasting lab visits for additional refills.

## 2022-12-01 NOTE — Telephone Encounter (Signed)
Noted  

## 2022-12-02 ENCOUNTER — Ambulatory Visit: Payer: Medicare PPO | Admitting: Pulmonary Disease

## 2022-12-02 ENCOUNTER — Encounter: Payer: Self-pay | Admitting: Pulmonary Disease

## 2022-12-02 VITALS — BP 122/80 | HR 106 | Temp 98.1°F | Ht 67.0 in | Wt 153.8 lb

## 2022-12-02 DIAGNOSIS — F17210 Nicotine dependence, cigarettes, uncomplicated: Secondary | ICD-10-CM

## 2022-12-02 DIAGNOSIS — J432 Centrilobular emphysema: Secondary | ICD-10-CM

## 2022-12-02 DIAGNOSIS — Z148 Genetic carrier of other disease: Secondary | ICD-10-CM

## 2022-12-02 DIAGNOSIS — J4489 Other specified chronic obstructive pulmonary disease: Secondary | ICD-10-CM

## 2022-12-02 MED ORDER — TRELEGY ELLIPTA 100-62.5-25 MCG/ACT IN AEPB: 1.0000 | INHALATION_SPRAY | Freq: Every day | RESPIRATORY_TRACT | 0 refills | Status: DC

## 2022-12-02 NOTE — Progress Notes (Signed)
Subjective:    Patient ID: Michael [name], male    DOB: [birth year], 62 y.o.   MRN: [id number] Patient Care Team: Eustaquio Boyden, MD as PCP - General (Family Medicine) Salena Saner, MD as Consulting Physician (Pulmonary Disease)  Chief Complaint  Patient presents with   Follow-up    No SOB or wheezing. Cough with white sputum. Brown sputum in the morning after he uses him inhaler.    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 He was last seen on 21 October 2022.  At that time he was noted to be doing well on Trelegy Ellipta and as needed albuterol.  He presents today still doing well with this regimen.  Still notices shortness of breath on exertion.  He has had no cough.  Continues to smoke three quarters of a pack a day of cigarettes. He does not endorse any orthopnea or paroxysmal nocturnal dyspnea.  No lower extremity edema or calf tenderness.  No chest pain.  He has occasional wheezing.  This responds to albuterol.  He continues to use Trelegy Ellipta and notes that this medication helps for more than any other medication he has tried.  Albuterol use is approximately 1 to twice per week.  He resolved the issues with payment for Trelegy and now is able to procure the medication.  DATA 06/28/2022 alpha-1 antitrypsin: Phenotype MS, level 155 mg/dL (reference 829 to 562 mg/dL).  Patient is a carrier but does not have disease. 07/13/2022 PFTs: FEV1 2.03 L or 62% predicted, FVC 3.39 L or 78% predicted, FEV1/FVC 60% predicted, he had a very significant bronchodilator response with 33% net change in FEV1 postbronchodilator.  Lung volumes normal.  Diffusion capacity results invalid due to error code during testing.  Consistent with moderate obstructive airways disease with reversible component (COPD asthma overlap). 09/15/2022 LDCT chest: Lung RADS 2, severe paraseptal and  centrilobular emphysema.  No consolidation, pleural effusion or pneumothorax.  Previously noted solid pulmonary nodule in the left lower lobe has decreased in size measuring 2.1 mm (4.1 mm prior) other bilateral solid pulmonary nodules stable.  Review of Systems A 10 point review of systems was performed and it is as noted above otherwise negative.  Patient Active Problem List   Diagnosis Date Noted   Generalized abdominal pain 11/03/2022   COVID-19 virus infection 03/15/2022   Advanced directives, counseling/discussion 12/24/2020   Papilledema 04/02/2020   Pulmonary nodules 02/12/2020   Emphysema of lung 02/12/2020   Constipation due to pain medication 10/16/2019   BPH (benign prostatic hyperplasia) 07/11/2018   Right inguinal hernia 07/11/2018   Cervical stenosis of spine 01/02/2018   HNP (herniated nucleus pulposus), cervical 09/30/2016   Dyslipidemia 02/29/2016   Hearing loss due to cerumen impaction 12/05/2015   History of kidney stones 03/06/2014   Healthcare maintenance 01/28/2012   Situational depression 10/08/2009   GERD 05/23/2009   PERSONAL HX COLONIC POLYPS 05/23/2009   INSOMNIA 02/04/2009   TOBACCO ABUSE 05/01/2007   Social History   Tobacco Use   Smoking status: Every Day    Packs/day: 1.00    Years: 40.00    Additional pack years: 0.00    Total pack years: 40.00    Types: Cigarettes   Smokeless tobacco: Never   Tobacco comments:    0.75 PPD 12/02/2022  Substance Use Topics   Alcohol use: No   Allergies  Allergen Reactions   Aspirin     REACTION: "did not work"   Codeine Sulfate     REACTION: "did not work"   Current Meds  Medication Sig   albuterol (VENTOLIN HFA) 108 (90 Base) MCG/ACT inhaler Inhale 2 puffs into the lungs every 6 (six) hours as needed.   Bioflavonoid Products (ESTER C PO) Take by mouth daily.   Cholecalciferol (VITAMIN D3 PO) Take by mouth.   cyanocobalamin 100 MCG tablet Take 100 mcg by mouth daily.    dexlansoprazole (DEXILANT)  60 MG capsule TAKE 1 CAPSULE BY MOUTH EVERY DAY   fish oil-omega-3 fatty acids 1000 MG capsule Take 2 g by mouth daily.   fluticasone (FLONASE) 50 MCG/ACT nasal spray SPRAY 2 SPRAYS INTO EACH NOSTRIL EVERY DAY   Fluticasone-Umeclidin-Vilant (TRELEGY ELLIPTA) 100-62.5-25 MCG/ACT AEPB Inhale 1 puff into the lungs daily.   HORIZANT 300 MG TBCR Take 1 tablet by mouth at bedtime.   Multiple Vitamin (MULTIVITAMIN) tablet Take 1 tablet by mouth daily.   nicotine (NICOTROL) 10 MG inhaler INHALE 1 CARTRIDGE (1 CONTINUOUS PUFFING TOTAL) INTO THE LUNGS AS NEEDED FOR SMOKING CESSATION.   nortriptyline (PAMELOR) 50 MG capsule Take 1 capsule (50 mg total) by mouth at bedtime.   oxyCODONE-acetaminophen (PERCOCET/ROXICET) 5-325 MG tablet Take 1 tablet by mouth at bedtime.   Probiotic Product (PROBIOTIC PO) Take by mouth.   tiZANidine (ZANAFLEX) 2 MG tablet Take 2 tablets by mouth at bedtime. Takes as needed   Zinc 30 MG TABS Take 1 tablet by mouth daily.   Immunization History  Administered Date(s) Administered   Influenza,inj,Quad PF,6+ Mos 07/11/2018   Influenza-Unspecified 05/09/2014, 05/26/2016, 05/16/2017   PFIZER(Purple Top)SARS-COV-2 Vaccination 11/02/2019, 11/27/2019   Td 08/10/2002   Tdap 07/25/2014       Objective:   Physical Exam BP 122/80 (BP Location: Left Arm, Cuff Size: Normal)   Pulse (!) 106   Temp 98.1 F (36.7 C)   Ht  (1.702 m)   Wt 153 lb 12.8 oz (69.8 kg)   SpO2 99%   BMI 24.09 kg/m   SpO2: 99 % O2 Device: None (Room air)  GENERAL: Well-developed, well-nourished gentleman, no acute distress.  Fully ambulatory. HEAD: Normocephalic, atraumatic.  EYES: Pupils equal, round, reactive to light.  No scleral icterus.  MOUTH: Oral mucosa moist.  No thrush. NECK: Supple. No thyromegaly. Trachea midline. No JVD.  No adenopathy. PULMONARY: Good air entry bilaterally.  Coarse otherwise, no adventitious sounds. CARDIOVASCULAR: S1 and S2. Regular rate and rhythm.  No rubs,  murmurs or gallops heard.  Recheck pulse rate was 85. ABDOMEN: Benign. MUSCULOSKELETAL: Good muscle tone.  No joint deformity, no clubbing, no edema.  NEUROLOGIC: No overt focal deficit, no gait disturbance, speech is fluent. SKIN: Intact,warm,dry. PSYCH: Mood and behavior normal.     Assessment & Plan:     ICD-10-CM   1. COPD with asthma  J44.89    Continue Trelegy 100, 1 inhalation daily Continue as needed albuterol    2. Centrilobular emphysema  J43.2    Significant as noted on CT chest Recommend smoking cessation    3. Alpha-1-antitrypsin deficiency carrier  Z14.8    Phenotype MS Alpha-1 levels normal    4. Tobacco dependence due to cigarettes  F17.210    Patient counseled regards discontinuation of smoking Total counseling time 3 to 5 minutes     Meds ordered this encounter  Medications   Fluticasone-Umeclidin-Vilant (TRELEGY ELLIPTA) 100-62.5-25 MCG/ACT AEPB    Sig: Inhale 1 puff into  the lungs daily.    Dispense:  28 each    Refill:  0    Order Specific Question:   Lot Number?    Answer:   [id number]    Order Specific Question:   Expiration Date?    Answer:   03/09/2024    Order Specific Question:   Quantity    Answer:   2   We will see the patient in follow-up in 6 months time he is to call sooner should any new problems arise.  Gailen Shelter, MD Advanced Bronchoscopy PCCM [facility]    *This note was dictated using voice recognition software/Dragon.  Despite best efforts to proofread, errors can occur which can change the meaning. Any transcriptional errors that result from this process are unintentional and may not be fully corrected at the time of dictation.

## 2022-12-02 NOTE — Patient Instructions (Signed)
Your lungs sounded clear today.  Continue your Trelegy 1 puff daily.  We have provided you with samples today.  We will see you in follow-up in 6 months time call sooner should any new problems arise.

## 2022-12-15 ENCOUNTER — Encounter: Payer: Self-pay | Admitting: Family Medicine

## 2022-12-15 ENCOUNTER — Ambulatory Visit: Payer: Medicare PPO | Admitting: Family Medicine

## 2022-12-15 VITALS — BP 122/78 | HR 107 | Temp 97.4°F | Ht 67.0 in | Wt 151.1 lb

## 2022-12-15 DIAGNOSIS — R300 Dysuria: Secondary | ICD-10-CM

## 2022-12-15 DIAGNOSIS — K5903 Drug induced constipation: Secondary | ICD-10-CM | POA: Diagnosis not present

## 2022-12-15 DIAGNOSIS — Z87442 Personal history of urinary calculi: Secondary | ICD-10-CM | POA: Diagnosis not present

## 2022-12-15 LAB — POC URINALSYSI DIPSTICK (AUTOMATED)
Bilirubin, UA: NEGATIVE
Glucose, UA: NEGATIVE
Ketones, UA: NEGATIVE
Leukocytes, UA: NEGATIVE
Nitrite, UA: NEGATIVE
Protein, UA: NEGATIVE
Spec Grav, UA: <=1.005 — AB (ref 1.010–1.025)
Urobilinogen, UA: 0.2 E.U./dL
pH, UA: 6 (ref 5.0–8.0)

## 2022-12-15 NOTE — Assessment & Plan Note (Addendum)
Anticipate recurrent - see above.  Kidney stone prevention diet handout provided

## 2022-12-15 NOTE — Progress Notes (Signed)
Ph: [phone]       Fax: [phone]   Patient ID: Michael [name], male    DOB: July 04, 1961, 62 y.o.   MRN: [id number]  This visit was conducted in person.  BP 122/78   Pulse (!) 107   Temp (!) 97.4 F (36.3 C) (Temporal)   Ht 5\' 7"  (1.702 m)   Wt 151 lb 2 oz (68.5 kg)   SpO2 94%   BMI 23.67 kg/m    CC: possible UTI Subjective:   HPI: Michael [name] is a 62 y.o. male presenting on 12/15/2022 for Dysuria (C/o burning when urinating and R-side low back pain. Sxs started 12/11/22. Thinks Michael [name] may have seen a kidney stone after urinating about 2 hrs ago. )   4d h/o end of stream dysuria associated with sudden urgency and R sided lower back pain. Michael [name] may have seen stone in urine after voiding 2 hours ago. Has also noted increased frequency.   Last kidney stone was 3-4 yrs ago.  No flank pain, fevers/chills, nausea, vomiting, groin pain. No hematuria.  No recent abx.   Recent contrasted CT abd/pelvis done at ER was overall reassuring. Did show 3mm R kidney stone.   Constipation - Michael [name]'s been doing better since taking daily miralax. Michael [name] is also on TCA and opiate.   Continued smoker ~1/2 ppd     Relevant past medical, surgical, family and social history reviewed and updated as indicated. Interim medical history since our last visit reviewed. Allergies and medications reviewed and updated. Outpatient Medications Prior to Visit  Medication Sig Dispense Refill   albuterol (VENTOLIN HFA) 108 (90 Base) MCG/ACT inhaler Inhale 2 puffs into the lungs every 6 (six) hours as needed. 8 g 2   Bioflavonoid Products (ESTER C PO) Take by mouth daily.     Cholecalciferol (VITAMIN D3 PO) Take by mouth.     cyanocobalamin 100 MCG tablet Take 100 mcg by mouth daily.      dexlansoprazole (DEXILANT) 60 MG capsule TAKE 1 CAPSULE BY MOUTH EVERY DAY 90 capsule 3   fish oil-omega-3 fatty acids 1000 MG capsule Take 2 g by mouth daily.     fluticasone (FLONASE) 50 MCG/ACT nasal spray SPRAY 2 SPRAYS INTO  EACH NOSTRIL EVERY DAY 48 mL 0   Fluticasone-Umeclidin-Vilant (TRELEGY ELLIPTA) 100-62.5-25 MCG/ACT AEPB Inhale 1 puff into the lungs daily. 28 each 11   Fluticasone-Umeclidin-Vilant (TRELEGY ELLIPTA) 100-62.5-25 MCG/ACT AEPB Inhale 1 puff into the lungs daily. 28 each 0   HORIZANT 300 MG TBCR Take 1 tablet by mouth at bedtime.     Multiple Vitamin (MULTIVITAMIN) tablet Take 1 tablet by mouth daily.     nicotine (NICOTROL) 10 MG inhaler INHALE 1 CARTRIDGE (1 CONTINUOUS PUFFING TOTAL) INTO THE LUNGS AS NEEDED FOR SMOKING CESSATION. 168 each 0   nortriptyline (PAMELOR) 50 MG capsule Take 1 capsule (50 mg total) by mouth at bedtime. 90 capsule 3   oxyCODONE-acetaminophen (PERCOCET/ROXICET) 5-325 MG tablet Take 1 tablet by mouth at bedtime.     Probiotic Product (PROBIOTIC PO) Take by mouth.     tiZANidine (ZANAFLEX) 2 MG tablet Take 2 tablets by mouth at bedtime. Takes as needed     Zinc 30 MG TABS Take 1 tablet by mouth daily.     sucralfate (CARAFATE) 1 g tablet Take 1 tablet (1 g total) by mouth 4 (four) times daily -  with meals and at bedtime. 60 tablet 0   No facility-administered medications prior to visit.  Per HPI unless specifically indicated in ROS section below Review of Systems  Objective:  BP 122/78   Pulse (!) 107   Temp (!) 97.4 F (36.3 C) (Temporal)   Ht 5\' 7"  (1.702 m)   Wt 151 lb 2 oz (68.5 kg)   SpO2 94%   BMI 23.67 kg/m   Wt Readings from Last 3 Encounters:  12/15/22 151 lb 2 oz (68.5 kg)  12/02/22 153 lb 12.8 oz (69.8 kg)  11/08/22 150 lb (68 kg)      Physical Exam Vitals and nursing note reviewed.  Constitutional:      Appearance: Normal appearance. Michael [name] is not ill-appearing.  Cardiovascular:     Rate and Rhythm: Normal rate and regular rhythm.     Pulses: Normal pulses.     Heart sounds: Normal heart sounds. No murmur heard. Pulmonary:     Effort: Pulmonary effort is normal. No respiratory distress.     Breath sounds: Normal breath sounds. No  wheezing, rhonchi or rales.  Abdominal:     General: Bowel sounds are normal. There is no distension.     Palpations: Abdomen is soft. There is no mass.     Tenderness: There is no abdominal tenderness. There is no right CVA tenderness, left CVA tenderness, guarding or rebound.     Hernia: No hernia is present.  Musculoskeletal:        General: Normal range of motion.     Comments: No midline lumbar spine pain or paraspinous mm tenderness  Skin:    General: Skin is warm and dry.     Findings: No rash.  Neurological:     Mental Status: Michael [name] is alert.  Psychiatric:        Mood and Affect: Mood normal.        Behavior: Behavior normal.       Results for orders placed or performed in visit on 12/15/22  POCT Urinalysis Dipstick (Automated)  Result Value Ref Range   Color, UA yellow    Clarity, UA clear    Glucose, UA Negative Negative   Bilirubin, UA negative    Ketones, UA negative    Spec Grav, UA <=1.005 (A) 1.010 - 1.025   Blood, UA 1+    pH, UA 6.0 5.0 - 8.0   Protein, UA Negative Negative   Urobilinogen, UA 0.2 0.2 or 1.0 E.U./dL   Nitrite, UA negative    Leukocytes, UA Negative Negative   Lab Results  Component Value Date   CREATININE 0.90 10/31/2022   BUN 11 10/31/2022   NA 135 10/31/2022   K 3.4 (L) 10/31/2022   CL 104 10/31/2022   CO2 25 10/31/2022    Assessment & Plan:   Problem List Items Addressed This Visit     History of kidney stones    Anticipate recurrent - see above.  Kidney stone prevention diet handout provided      Constipation due to pain medication    Doing better with more regular miralax use.       Dysuria - Primary    Several days of dysuria associated with urgency and R lower back pain.  R LBP may have been MSK as Michael [name]'s recently been more active planting tomatoes.  Suspect urinary symptoms may be related to passed kidney stone (3mm on recent CT). Reassurance provided. UA today overall normal. Recent CT reassuring as well. Discussed  NSAID use PRN.  Update if ongoing or worsening symptoms.       Relevant Orders  POCT Urinalysis Dipstick (Automated) (Completed)     No orders of the defined types were placed in this encounter.   Orders Placed This Encounter  Procedures   POCT Urinalysis Dipstick (Automated)    Patient Instructions  Urine returned ok.  Possible kidney stone that has passed.  May use ibuprofen 600mg  as needed for lower back pain.  Let us know if ongoing or worsening pain or other symptoms.  Look at handout for kidney stone diet.   Follow up plan: Return if symptoms worsen or fail to improve.  Eustaquio Boyden, MD

## 2022-12-15 NOTE — Assessment & Plan Note (Signed)
Several days of dysuria associated with urgency and R lower back pain.  R LBP may have been MSK as he's recently been more active planting tomatoes.  Suspect urinary symptoms may be related to passed kidney stone (3mm on recent CT). Reassurance provided. UA today overall normal. Recent CT reassuring as well. Discussed NSAID use PRN.  Update if ongoing or worsening symptoms.

## 2022-12-15 NOTE — Assessment & Plan Note (Signed)
Doing better with more regular miralax use.

## 2022-12-15 NOTE — Patient Instructions (Addendum)
Urine returned ok.  Possible kidney stone that has passed.  May use ibuprofen 600mg  as needed for lower back pain.  Let us know if ongoing or worsening pain or other symptoms.  Look at handout for kidney stone diet.

## 2022-12-31 ENCOUNTER — Other Ambulatory Visit: Payer: Self-pay | Admitting: Family Medicine

## 2022-12-31 DIAGNOSIS — F4321 Adjustment disorder with depressed mood: Secondary | ICD-10-CM

## 2023-01-08 ENCOUNTER — Other Ambulatory Visit: Payer: Self-pay | Admitting: Family Medicine

## 2023-01-08 DIAGNOSIS — E559 Vitamin D deficiency, unspecified: Secondary | ICD-10-CM | POA: Insufficient documentation

## 2023-01-08 DIAGNOSIS — E785 Hyperlipidemia, unspecified: Secondary | ICD-10-CM

## 2023-01-08 DIAGNOSIS — N401 Enlarged prostate with lower urinary tract symptoms: Secondary | ICD-10-CM

## 2023-01-10 ENCOUNTER — Other Ambulatory Visit (INDEPENDENT_AMBULATORY_CARE_PROVIDER_SITE_OTHER): Payer: Medicare PPO

## 2023-01-10 DIAGNOSIS — E785 Hyperlipidemia, unspecified: Secondary | ICD-10-CM

## 2023-01-10 DIAGNOSIS — E559 Vitamin D deficiency, unspecified: Secondary | ICD-10-CM

## 2023-01-10 DIAGNOSIS — R3912 Poor urinary stream: Secondary | ICD-10-CM | POA: Diagnosis not present

## 2023-01-10 DIAGNOSIS — N401 Enlarged prostate with lower urinary tract symptoms: Secondary | ICD-10-CM | POA: Diagnosis not present

## 2023-01-10 LAB — COMPREHENSIVE METABOLIC PANEL
ALT: 16 U/L (ref 0–53)
AST: 16 U/L (ref 0–37)
Albumin: 4.3 g/dL (ref 3.5–5.2)
Alkaline Phosphatase: 106 U/L (ref 39–117)
BUN: 10 mg/dL (ref 6–23)
CO2: 29 mEq/L (ref 19–32)
Calcium: 9.5 mg/dL (ref 8.4–10.5)
Chloride: 104 mEq/L (ref 96–112)
Creatinine, Ser: 0.89 mg/dL (ref 0.40–1.50)
GFR: 91.01 mL/min (ref >60.00)
Glucose, Bld: 90 mg/dL (ref 70–99)
Potassium: 4.1 mEq/L (ref 3.5–5.1)
Sodium: 141 mEq/L (ref 135–145)
Total Bilirubin: 0.5 mg/dL (ref 0.2–1.2)
Total Protein: 6.8 g/dL (ref 6.0–8.3)

## 2023-01-10 LAB — LIPID PANEL
Cholesterol: 180 mg/dL (ref 0–200)
HDL: 43.3 mg/dL (ref >39.00)
LDL Cholesterol: 99 mg/dL (ref 0–99)
NonHDL: 136.85
Total CHOL/HDL Ratio: 4
Triglycerides: 187 mg/dL — ABNORMAL HIGH (ref 0.0–149.0)
VLDL: 37.4 mg/dL (ref 0.0–40.0)

## 2023-01-10 LAB — PSA: PSA: 0.37 ng/mL (ref 0.10–4.00)

## 2023-01-10 LAB — TSH: TSH: 1.08 u[IU]/mL (ref 0.35–5.50)

## 2023-01-10 LAB — VITAMIN D 25 HYDROXY (VIT D DEFICIENCY, FRACTURES): VITD: 37.63 ng/mL (ref 30.00–100.00)

## 2023-01-18 ENCOUNTER — Encounter: Payer: Self-pay | Admitting: Family Medicine

## 2023-01-18 ENCOUNTER — Ambulatory Visit (INDEPENDENT_AMBULATORY_CARE_PROVIDER_SITE_OTHER): Payer: Medicare PPO | Admitting: Family Medicine

## 2023-01-18 VITALS — BP 124/70 | HR 100 | Temp 97.3°F | Ht 67.5 in | Wt 150.0 lb

## 2023-01-18 DIAGNOSIS — E559 Vitamin D deficiency, unspecified: Secondary | ICD-10-CM

## 2023-01-18 DIAGNOSIS — G4700 Insomnia, unspecified: Secondary | ICD-10-CM

## 2023-01-18 DIAGNOSIS — K5903 Drug induced constipation: Secondary | ICD-10-CM

## 2023-01-18 DIAGNOSIS — E785 Hyperlipidemia, unspecified: Secondary | ICD-10-CM

## 2023-01-18 DIAGNOSIS — K2100 Gastro-esophageal reflux disease with esophagitis, without bleeding: Secondary | ICD-10-CM

## 2023-01-18 DIAGNOSIS — Z7189 Other specified counseling: Secondary | ICD-10-CM

## 2023-01-18 DIAGNOSIS — Z0000 Encounter for general adult medical examination without abnormal findings: Secondary | ICD-10-CM | POA: Diagnosis not present

## 2023-01-18 DIAGNOSIS — F4321 Adjustment disorder with depressed mood: Secondary | ICD-10-CM

## 2023-01-18 DIAGNOSIS — F17200 Nicotine dependence, unspecified, uncomplicated: Secondary | ICD-10-CM

## 2023-01-18 DIAGNOSIS — J439 Emphysema, unspecified: Secondary | ICD-10-CM

## 2023-01-18 MED ORDER — DEXLANSOPRAZOLE 60 MG PO CPDR: 60.0000 mg | DELAYED_RELEASE_CAPSULE | Freq: Every day | ORAL | 4 refills | Status: DC

## 2023-01-18 NOTE — Progress Notes (Signed)
Ph: [phone] Fax: [phone]   Patient ID: Michael [name], male    DOB: 1961/07/02, 62 y.o.   MRN: [id number]  This visit was conducted in person.  BP 124/70   Pulse 100   Temp (!) 97.3 F (36.3 C) (Temporal)   Ht 5' 7.5" (1.715 m)   Wt 150 lb (68 kg)   SpO2 96%   BMI 23.15 kg/m    CC: AMW Subjective:   HPI: Michael [name] is a 62 y.o. male presenting on 01/18/2023 for Medicare Wellness (Pt accompanied by wife, Aurea Graff. )   Did not see health advisor this year.   Hearing Screening   500Hz  1000Hz  2000Hz  4000Hz   Right ear 20 20 20  0  Left ear 20 20 20  0   Vision Screening   Right eye Left eye Both eyes  Without correction 20/25 20/25 20/20   With correction       Flowsheet Row Office Visit from 01/18/2023 in [facility] [place]  PHQ-2 Total Score 0          01/18/2023   12:58 PM 12/15/2022    3:31 PM 11/02/2022    2:08 PM 12/18/2021    2:16 PM 12/17/2020   12:05 PM  Fall Risk   Falls in the past year? 0 0 0 0 0  Number falls in past yr:    0 0  Injury with Fall?    0 0  Risk for fall due to :    Medication side effect Medication side effect  Follow up    Falls evaluation completed;Education provided;Falls prevention discussed Falls evaluation completed;Falls prevention discussed   S/p ACDF 2018 Venetia Maxon) with chronic residual pain - saw Dr Ollen Bowl s/p steroid injections. Received SS disability - started 12/08/2019. Now sees Dr Kendell Bane Dawley and pain management Cleopatra Cedar, Celedonio Miyamoto) Q25mo with steroid injections. Takes oxycodone and nortriptyline at bedtime.    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  GERD on dexilant 60mg  daily.   Preventative: COLONOSCOPY 03/2021 - TA/HP, rpt 3 yrs Russella Dar)  Prostate cancer screening - reassuring PSA yearly. No nocturia.  Lung cancer screening - undergoing, started 12/2019, latest 12/2020, due for rpt.  Flu shot yearly COVID vaccine Pfizer 10/2019, 11/2019, no booster Tdap 2015 RSV - to  consider  Shingrix - discussed, to check with pharmacy  Advanced directive: discussed. Doesn't have advanced directive. Would want wife Aurea Graff to be HCPOA. Advanced directive provided today.  Seat belt use discussed Sunscreen use discussed. No changing moles on skin.  Smoking - 3/4 ppd - encouraged full cessation, 40+ PY hx.  Alcohol - none  Dentist yearly - due, needs lower work done - doesn't have dental insurance Eye exam yearly ([place])  Bowel - constipation managed with daily miralax - BM every 2-3 days. He is on medicines that contribute to constipation (TCA, opiate). 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 Interim medical history since our last visit reviewed. Allergies and medications reviewed and updated. Outpatient Medications Prior to Visit  Medication Sig Dispense Refill   albuterol (VENTOLIN HFA) 108 (90 Base) MCG/ACT inhaler Inhale 2 puffs into the lungs every 6 (six) hours as needed. 8 g 2  Bioflavonoid Products (ESTER C PO) Take by mouth daily.     Cholecalciferol (VITAMIN D3 PO) Take by mouth.     cyanocobalamin 100 MCG tablet Take 100 mcg by mouth daily.      fish oil-omega-3 fatty acids 1000 MG capsule Take 2 g by mouth daily.     fluticasone (FLONASE) 50 MCG/ACT nasal spray SPRAY 2 SPRAYS INTO EACH NOSTRIL EVERY DAY 48 mL 0   Fluticasone-Umeclidin-Vilant (TRELEGY ELLIPTA) 100-62.5-25 MCG/ACT AEPB Inhale 1 puff into the lungs daily. 28 each 11   HORIZANT 300 MG TBCR Take 1 tablet by mouth at bedtime.     Multiple Vitamin (MULTIVITAMIN) tablet Take 1 tablet by mouth daily.     nicotine (NICOTROL) 10 MG inhaler INHALE 1 CARTRIDGE (1 CONTINUOUS PUFFING TOTAL) INTO THE LUNGS AS  NEEDED FOR SMOKING CESSATION. 168 each 0   oxyCODONE-acetaminophen (PERCOCET/ROXICET) 5-325 MG tablet Take 1 tablet by mouth at bedtime.     Probiotic Product (PROBIOTIC PO) Take by mouth.     tiZANidine (ZANAFLEX) 2 MG tablet Take 2 tablets by mouth at bedtime. Takes as needed     Zinc 30 MG TABS Take 1 tablet by mouth daily.     dexlansoprazole (DEXILANT) 60 MG capsule TAKE 1 CAPSULE BY MOUTH EVERY DAY 90 capsule 0   Fluticasone-Umeclidin-Vilant (TRELEGY ELLIPTA) 100-62.5-25 MCG/ACT AEPB Inhale 1 puff into the lungs daily. 28 each 0   nortriptyline (PAMELOR) 50 MG capsule TAKE 1 CAPSULE BY MOUTH AT BEDTIME. 90 capsule 0   nortriptyline (PAMELOR) 50 MG capsule Take 1 capsule (50 mg total) by mouth at bedtime.     No facility-administered medications prior to visit.     Per HPI unless specifically indicated in ROS section below Review of Systems  Constitutional:  Negative for activity change, appetite change, chills, fatigue, fever and unexpected weight change.  HENT:  Negative for hearing loss.   Eyes:  Negative for visual disturbance.  Respiratory:  Negative for cough, chest tightness, shortness of breath and wheezing.   Cardiovascular:  Negative for chest pain, palpitations and leg swelling.  Gastrointestinal:  Negative for abdominal distention, abdominal pain, blood in stool, constipation, diarrhea, nausea and vomiting.  Genitourinary:  Negative for difficulty urinating and hematuria.  Musculoskeletal:  Positive for arthralgias and back pain. Negative for myalgias and neck pain.  Skin:  Negative for rash.  Neurological:  Negative for dizziness, seizures, syncope and headaches.  Hematological:  Negative for adenopathy. Bruises/bleeds easily.  Psychiatric/Behavioral:  Negative for dysphoric mood. The patient is not nervous/anxious.     Objective:  BP 124/70   Pulse 100   Temp (!) 97.3 F (36.3 C) (Temporal)   Ht 5' 7.5" (1.715 m)   Wt 150 lb (68 kg)   SpO2 96%   BMI 23.15 kg/m    Wt Readings from Last 3 Encounters:  01/18/23 150 lb (68 kg)  12/15/22 151 lb 2 oz (68.5 kg)  12/02/22 153 lb 12.8 oz (69.8 kg)      Physical Exam Vitals and nursing note reviewed.  Constitutional:      General: He is not in acute distress.    Appearance: Normal appearance. He is well-developed. He is not ill-appearing.  HENT:     Head: Normocephalic and atraumatic.     Right Ear: Hearing, tympanic membrane, ear canal and external ear normal.     Left Ear: Hearing, tympanic membrane, ear canal and external ear normal.     Nose: Nose normal.     Mouth/Throat:  Mouth: Mucous membranes are moist.     Pharynx: Oropharynx is clear. No oropharyngeal exudate or posterior oropharyngeal erythema.  Eyes:     General: No scleral icterus.    Extraocular Movements: Extraocular movements intact.     Conjunctiva/sclera: Conjunctivae normal.     Pupils: Pupils are equal, round, and reactive to light.  Neck:     Thyroid: No thyroid mass or thyromegaly.     Vascular: No carotid bruit.  Cardiovascular:     Rate and Rhythm: Normal rate and regular rhythm.     Pulses: Normal pulses.          Radial pulses are 2+ on the right side and 2+ on the left side.     Heart sounds: Normal heart sounds. No murmur heard. Pulmonary:     Effort: Pulmonary effort is normal. No respiratory distress.     Breath sounds: Normal breath sounds. No wheezing, rhonchi or rales.  Abdominal:     General: Bowel sounds are normal. There is no distension.     Palpations: Abdomen is soft. There is no mass.     Tenderness: There is no abdominal tenderness. There is no guarding or rebound.     Hernia: No hernia is present.  Musculoskeletal:        General: Normal range of motion.     Cervical back: Normal range of motion and neck supple.     Right lower leg: No edema.     Left lower leg: No edema.  Lymphadenopathy:     Cervical: No cervical adenopathy.  Skin:    General: Skin is warm and dry.     Findings: No  rash.  Neurological:     General: No focal deficit present.     Mental Status: He is alert and oriented to person, place, and time.     Comments:  Recall 3/3  Calculation 4/5 serial 3s  Psychiatric:        Mood and Affect: Mood normal.        Behavior: Behavior normal.        Thought Content: Thought content normal.        Judgment: Judgment normal.       Results for orders placed or performed in visit on 01/10/23  VITAMIN D 25 Hydroxy (Vit-D Deficiency, Fractures)  Result Value Ref Range   VITD 37.63 30.00 - 100.00 ng/mL  TSH  Result Value Ref Range   TSH 1.08 0.35 - 5.50 uIU/mL  PSA  Result Value Ref Range   PSA 0.37 0.10 - 4.00 ng/mL  Comprehensive metabolic panel  Result Value Ref Range   Sodium 141 135 - 145 mEq/L   Potassium 4.1 3.5 - 5.1 mEq/L   Chloride 104 96 - 112 mEq/L   CO2 29 19 - 32 mEq/L   Glucose, Bld 90 70 - 99 mg/dL   BUN 10 6 - 23 mg/dL   Creatinine, Ser 1.61 0.40 - 1.50 mg/dL   Total Bilirubin 0.5 0.2 - 1.2 mg/dL   Alkaline Phosphatase 106 39 - 117 U/L   AST 16 0 - 37 U/L   ALT 16 0 - 53 U/L   Total Protein 6.8 6.0 - 8.3 g/dL   Albumin 4.3 3.5 - 5.2 g/dL   GFR 09.60 >45.40 mL/min   Calcium 9.5 8.4 - 10.5 mg/dL  Lipid panel  Result Value Ref Range   Cholesterol 180 0 - 200 mg/dL   Triglycerides 981.1 (H) 0.0 - 149.0 mg/dL   HDL  43.30 >39.00 mg/dL   VLDL 62.1 0.0 - 30.8 mg/dL   LDL Cholesterol 99 0 - 99 mg/dL   Total CHOL/HDL Ratio 4    NonHDL 136.85       01/18/2023   12:58 PM 12/15/2022    3:31 PM 11/02/2022    2:08 PM 12/18/2021    2:16 PM 12/17/2020   12:05 PM  Depression screen PHQ 2/9  Decreased Interest 0 0 1 0 0  Down, Depressed, Hopeless 0 0 1 0 0  PHQ - 2 Score 0 0 2 0 0  Altered sleeping 0 2 2  0  Tired, decreased energy 1 1 2   0  Change in appetite 0 0 1  0  Feeling bad or failure about yourself  0 0 0  0  Trouble concentrating 0 0 0  0  Moving slowly or fidgety/restless 0 0 0  0  Suicidal thoughts 0 0 0  0  PHQ-9 Score 1 3  7   0  Difficult doing work/chores Not difficult at all  Not difficult at all  Not difficult at all       01/18/2023   12:58 PM 12/15/2022    3:32 PM 11/02/2022    2:08 PM 07/11/2018    2:44 PM  GAD 7 : Generalized Anxiety Score  Nervous, Anxious, on Edge 0 0 1 0  Control/stop worrying 0 0 0 1  Worry too much - different things 0 0 0 1  Trouble relaxing 0 1 1 1   Restless 0 0 1 0  Easily annoyed or irritable 0 0 1 0  Afraid - awful might happen 0 0 0 0  Total GAD 7 Score 0 1 4 3   Anxiety Difficulty   Not difficult at all    Assessment & Plan:   Problem List Items Addressed This Visit     Healthcare maintenance (Chronic)    Preventative protocols reviewed and updated unless pt declined. Discussed healthy diet and lifestyle.       Advanced directives, counseling/discussion (Chronic)    Advanced directive: discussed. Doesn't have advanced directive. Would want wife Aurea Graff to be HCPOA. Advanced directive provided today.       Medicare annual wellness visit, subsequent - Primary (Chronic)    I have personally reviewed the Medicare Annual Wellness questionnaire and have noted 1. The patient's medical and social history 2. Their use of alcohol, tobacco or illicit drugs 3. Their current medications and supplements 4. The patient's functional ability including ADL's, fall risks, home safety risks and hearing or visual impairment. Cognitive function has been assessed and addressed as indicated.  5. Diet and physical activity 6. Evidence for depression or mood disorders The patients weight, height, BMI have been recorded in the chart. I have made referrals, counseling and provided education to the patient based on review of the above and I have provided the pt with a written personalized care plan for preventive services. Provider list updated.. See scanned questionairre as needed for further documentation. Reviewed preventative protocols and updated unless pt declined.       TOBACCO  ABUSE    Continued smoker down to 3/4 ppd. Encouraged full cessation. Continues yearly lung cancer screening CT.       Situational depression   Relevant Medications   nortriptyline (PAMELOR) 50 MG capsule   GERD    Chronic, stable period on dexilant 60mg  daily. Sees GI.  H/o esophagitis, gastritis, duodenitis, and HH.       Relevant Medications  dexlansoprazole (DEXILANT) 60 MG capsule   INSOMNIA    Chronic, continues nightly nortriptyline       Dyslipidemia    Chronic, stable only on fish oil 2gm daily.  Not on statin.  The 10-year ASCVD risk score (Arnett DK, et al., 2019) is: 15.2%   Values used to calculate the score:     Age: 79 years     Sex: Male     Is Non-Hispanic African American: No     Diabetic: No     Tobacco smoker: Yes     Systolic Blood Pressure: 124 mmHg     Is BP treated: No     HDL Cholesterol: 43.3 mg/dL     Total Cholesterol: 180 mg/dL       Constipation due to pain medication    Stable period on regular miralax       Emphysema of lung (HCC)    Appreciate pulm care - continue Trelegy.       Vitamin D deficiency    Levels stable - will need to verify he continues taking vit D 1000 IU daily.         Meds ordered this encounter  Medications   dexlansoprazole (DEXILANT) 60 MG capsule    Sig: Take 1 capsule (60 mg total) by mouth daily.    Dispense:  90 capsule    Refill:  4    No orders of the defined types were placed in this encounter.   Patient Instructions  Consider 2 shingrix shots and RSV shot through the pharmacy.  Consider pneumonia shot (Prevnar-20). Advanced directive packet provided today  Continue working on quitting smoking.  Good to see you today Return as needed or in 1 year for next physical/wellness visit  Follow up plan: Return in about 1 year (around 01/18/2024) for annual exam, prior fasting for blood work, medicare wellness visit.  Eustaquio Boyden, MD

## 2023-01-18 NOTE — Assessment & Plan Note (Signed)
Preventative protocols reviewed and updated unless pt declined. Discussed healthy diet and lifestyle.  

## 2023-01-18 NOTE — Assessment & Plan Note (Signed)

## 2023-01-18 NOTE — Assessment & Plan Note (Signed)
Appreciate pulm care - continue Trelegy.

## 2023-01-18 NOTE — Assessment & Plan Note (Signed)
Advanced directive: discussed. Doesn't have advanced directive. Would want wife Joan to be HCPOA. Advanced directive provided today.  

## 2023-01-18 NOTE — Assessment & Plan Note (Signed)
Chronic, continues nightly nortriptyline

## 2023-01-18 NOTE — Assessment & Plan Note (Signed)
Continued smoker down to 3/4 ppd. Encouraged full cessation. Continues yearly lung cancer screening CT.

## 2023-01-18 NOTE — Assessment & Plan Note (Signed)
Chronic, stable period on dexilant 60mg  daily. Sees GI.  H/o esophagitis, gastritis, duodenitis, and HH.

## 2023-01-18 NOTE — Assessment & Plan Note (Signed)
Chronic, stable only on fish oil 2gm daily.  Not on statin.  The 10-year ASCVD risk score (Arnett DK, et al., 2019) is: 15.2%   Values used to calculate the score:     Age: 62 years     Sex: Male     Is Non-Hispanic African American: No     Diabetic: No     Tobacco smoker: Yes     Systolic Blood Pressure: 124 mmHg     Is BP treated: No     HDL Cholesterol: 43.3 mg/dL     Total Cholesterol: 180 mg/dL

## 2023-01-18 NOTE — Patient Instructions (Addendum)
Consider 2 shingrix shots and RSV shot through the pharmacy.  Consider pneumonia shot (Prevnar-20). Advanced directive packet provided today  Continue working on quitting smoking.  Good to see you today Return as needed or in 1 year for next physical/wellness visit

## 2023-01-18 NOTE — Assessment & Plan Note (Signed)
Stable period on regular miralax

## 2023-01-18 NOTE — Assessment & Plan Note (Signed)
Levels stable - will need to verify he continues taking vit D 1000 IU daily.

## 2023-02-05 ENCOUNTER — Encounter: Payer: Self-pay | Admitting: Pulmonary Disease

## 2023-03-02 ENCOUNTER — Other Ambulatory Visit: Payer: Self-pay | Admitting: Family Medicine

## 2023-03-03 ENCOUNTER — Other Ambulatory Visit: Payer: Self-pay

## 2023-03-03 NOTE — Telephone Encounter (Signed)
Stopped by Dr. Reece Agar (see 12/15/22 OV notes).  Request denied.

## 2023-03-18 ENCOUNTER — Encounter: Payer: Self-pay | Admitting: Pulmonary Disease

## 2023-03-27 ENCOUNTER — Other Ambulatory Visit: Payer: Self-pay | Admitting: Family Medicine

## 2023-03-27 DIAGNOSIS — F4321 Adjustment disorder with depressed mood: Secondary | ICD-10-CM

## 2023-03-28 ENCOUNTER — Telehealth: Payer: Self-pay | Admitting: Family Medicine

## 2023-03-28 NOTE — Telephone Encounter (Signed)
Left message to return call to our office.  

## 2023-03-28 NOTE — Telephone Encounter (Signed)
Patients wife called in stating that her husbands symptoms of headache,pressure,and cough started last Thursday. However he took a covid test today and it was positive but his symptoms have gotten better,no more headache and a small cough. She said that last year he was prescribed amoxicillin for an sickness,and had 3 pills left that he took this time for his symptoms and they have helped him.She said that he still has pressure in his face and the small cough,which he fells like he just have a sinus infection now.She would like to know if he can have some more amoxicillin called in to help him with these last bit of symptoms that he is experiencing?   [id number] [place], [place] - [address] Phone: [phone]  Fax: [phone]

## 2023-03-31 NOTE — Telephone Encounter (Signed)
Called and spoke to wife patient set up for virtual visit Friday to see Dr. Reece Agar. Patient did not want to see anyone else. Reviewed red words if any will go to ED/UC.

## 2023-04-01 ENCOUNTER — Telehealth: Payer: Medicare PPO | Admitting: Family Medicine

## 2023-04-01 ENCOUNTER — Encounter: Payer: Self-pay | Admitting: Family Medicine

## 2023-04-01 VITALS — BP 137/101 | HR 103 | Temp 97.0°F | Ht 67.0 in | Wt 154.0 lb

## 2023-04-01 DIAGNOSIS — J439 Emphysema, unspecified: Secondary | ICD-10-CM | POA: Diagnosis not present

## 2023-04-01 DIAGNOSIS — F17200 Nicotine dependence, unspecified, uncomplicated: Secondary | ICD-10-CM

## 2023-04-01 DIAGNOSIS — R030 Elevated blood-pressure reading, without diagnosis of hypertension: Secondary | ICD-10-CM | POA: Insufficient documentation

## 2023-04-01 DIAGNOSIS — U071 COVID-19: Secondary | ICD-10-CM

## 2023-04-01 DIAGNOSIS — J019 Acute sinusitis, unspecified: Secondary | ICD-10-CM | POA: Diagnosis not present

## 2023-04-01 MED ORDER — GUAIFENESIN-CODEINE 100-10 MG/5ML PO SYRP: 5.0000 mL | ORAL_SOLUTION | Freq: Three times a day (TID) | ORAL | 0 refills | Status: DC | PRN

## 2023-04-01 MED ORDER — AMOXICILLIN-POT CLAVULANATE 875-125 MG PO TABS: 1.0000 | ORAL_TABLET | Freq: Two times a day (BID) | ORAL | 0 refills | Status: AC

## 2023-04-01 MED ORDER — FLUTICASONE PROPIONATE 50 MCG/ACT NA SUSP: NASAL | 1 refills | Status: DC

## 2023-04-01 NOTE — Assessment & Plan Note (Addendum)
Anticipate bacterial sinusitis after initial COVID infection given duration and progression of symptoms.  Treat with augmentin antibiotic course as well as codeine cough syrup. Refilled flonase.  Further supportive measures reviewed.  Update if not improving with treatment.  Confirmed with patient he doesn't truly have codeine or aspirin allergies, more they didn't work for him when previously tried.

## 2023-04-01 NOTE — Progress Notes (Signed)
Ph: [phone] Fax: [phone]   Patient ID: Michael [name], male    DOB: 10-20-1960, 62 y.o.   MRN: [id number]  Virtual visit completed through MyChart, a video enabled telemedicine application. Due to national recommendations of social distancing due to COVID-19, a virtual visit is felt to be most appropriate for this patient at this time. Reviewed limitations, risks, security and privacy concerns of performing a virtual visit and the availability of in person appointments. I also reviewed that there may be a patient responsible charge related to this service. The patient agreed to proceed.   Patient location: home Provider location: [place] at [facility], office Persons participating in this virtual visit: patient, provider   If any vitals were documented, they were collected by patient at home unless specified below.    BP (!) 137/101   Pulse (!) 103   Temp (!) 97 F (36.1 C) (Temporal)   Ht 5\' 7"  (1.702 m)   Wt 154 lb (69.9 kg)   SpO2 96%   BMI 24.12 kg/m    CC: COVID, ongoing sinus congestion Subjective:   HPI: Michael [name] is a 62 y.o. male presenting on 04/01/2023 for sinus pressure  (Positive for COVID dry cough with thick yellow/green phlegm blows green mucus )   First day of symptoms: 1.5 wks ago Tested COVID positive: last week  Tested negative yesterday Current symptoms: sinus congestion with facial pressure, productive cough with thick colored mucous, purulent mucous when blowing nose, coughing fits worse overnight, dry heaves after coughing, wheezing last night No: fevers/chills, ear or tooth pain, dyspnea, abd pain, nausea or diarrhea Treatments to date: took leftover amoxicillin, tylenol Risk factors include: age, gender, emphysema on Trelegy  COVID vaccination status: x2       Relevant past medical, surgical, family and social history reviewed and updated as indicated. Interim medical history since our last visit reviewed. Allergies and  medications reviewed and updated. Outpatient Medications Prior to Visit  Medication Sig Dispense Refill   albuterol (VENTOLIN HFA) 108 (90 Base) MCG/ACT inhaler Inhale 2 puffs into the lungs every 6 (six) hours as needed. 8 g 2   Bioflavonoid Products (ESTER C PO) Take by mouth daily.     Cholecalciferol (VITAMIN D3 PO) Take by mouth.     cyanocobalamin 100 MCG tablet Take 100 mcg by mouth daily.      dexlansoprazole (DEXILANT) 60 MG capsule Take 1 capsule (60 mg total) by mouth daily. 90 capsule 4   fish oil-omega-3 fatty acids 1000 MG capsule Take 2 g by mouth daily.     Fluticasone-Umeclidin-Vilant (TRELEGY ELLIPTA) 100-62.5-25 MCG/ACT AEPB Inhale 1 puff into the lungs daily. 28 each 11   HORIZANT 300 MG TBCR Take 1 tablet by mouth at bedtime.     Multiple Vitamin (MULTIVITAMIN) tablet Take 1 tablet by mouth daily.     nortriptyline (PAMELOR) 50 MG capsule TAKE 1 CAPSULE BY MOUTH EVERYDAY AT BEDTIME 90 capsule 0   oxyCODONE-acetaminophen (PERCOCET/ROXICET) 5-325 MG tablet Take 1 tablet by mouth at bedtime.     Probiotic Product (PROBIOTIC PO) Take by mouth.     tiZANidine (ZANAFLEX) 2 MG tablet Take 2 tablets by mouth at bedtime. Takes as needed     Zinc 30 MG TABS Take 1 tablet by mouth daily.     fluticasone (FLONASE) 50 MCG/ACT nasal spray SPRAY 2 SPRAYS INTO EACH NOSTRIL EVERY DAY 48 mL 0   nicotine (NICOTROL) 10 MG inhaler INHALE 1 CARTRIDGE (1 CONTINUOUS PUFFING  TOTAL) INTO THE LUNGS AS NEEDED FOR SMOKING CESSATION. (Patient not taking: Reported on 04/01/2023) 168 each 0   No facility-administered medications prior to visit.     Per HPI unless specifically indicated in ROS section below Review of Systems Objective:  BP (!) 137/101   Pulse (!) 103   Temp (!) 97 F (36.1 C) (Temporal)   Ht 5\' 7"  (1.702 m)   Wt 154 lb (69.9 kg)   SpO2 96%   BMI 24.12 kg/m   Wt Readings from Last 3 Encounters:  04/01/23 154 lb (69.9 kg)  01/18/23 150 lb (68 kg)  12/15/22 151 lb 2 oz (68.5  kg)       Physical exam: Gen: alert, NAD, not ill appearing Pulm: speaks in complete sentences without increased work of breathing Psych: normal mood, normal thought content      Assessment & Plan:   COVID-19 virus infection Assessment & Plan: COVID symptoms for the past 1.5 weeks, overall seem to be resolving, now with worsening sinus symptoms as per below.  Encouraged fluids and rest. Reviewed further supportive care measures at home including vit C 500mg  bid, vit D 2000 IU daily, zinc 100mg  daily, tylenol PRN.   Recommend:  Out of range for antiviral   TOBACCO ABUSE Assessment & Plan: Continued <1ppd smoker - encouraged cessation   Acute non-recurrent sinusitis, unspecified location Assessment & Plan: Anticipate bacterial sinusitis after initial COVID infection given duration and progression of symptoms.  Treat with augmentin antibiotic course as well as codeine cough syrup. Refilled flonase.  Further supportive measures reviewed.  Update if not improving with treatment.  Confirmed with patient he doesn't truly have codeine or aspirin allergies, more they didn't work for him when previously tried.    Pulmonary emphysema, unspecified emphysema type (HCC)  Elevated blood pressure reading without diagnosis of hypertension Assessment & Plan: Presumed related to acute illness. He will continue to monitor BP at home once feeling better and let me know if persistently >140/90.    Other orders -     Fluticasone Propionate; SPRAY 2 SPRAYS INTO EACH NOSTRIL EVERY DAY  Dispense: 48 mL; Refill: 1 -     Amoxicillin-Pot Clavulanate; Take 1 tablet by mouth 2 (two) times daily for 10 days.  Dispense: 20 tablet; Refill: 0 -     guaiFENesin-Codeine; Take 5 mLs by mouth 3 (three) times daily as needed for cough (sedation precautions).  Dispense: 120 mL; Refill: 0     I discussed the assessment and treatment plan with the patient. The patient was provided an opportunity to ask  questions and all were answered. The patient agreed with the plan and demonstrated an understanding of the instructions. The patient was advised to call back or seek an in-person evaluation if the symptoms worsen or if the condition fails to improve as anticipated.  Follow up plan: Return if symptoms worsen or fail to improve.  Eustaquio Boyden, MD

## 2023-04-01 NOTE — Assessment & Plan Note (Addendum)
COVID symptoms for the past 1.5 weeks, overall seem to be resolving, now with worsening sinus symptoms as per below.  Encouraged fluids and rest. Reviewed further supportive care measures at home including vit C 500mg  bid, vit D 2000 IU daily, zinc 100mg  daily, tylenol PRN.   Recommend:  Out of range for antiviral

## 2023-04-01 NOTE — Assessment & Plan Note (Signed)
Continued <1ppd smoker - encouraged cessation

## 2023-04-01 NOTE — Assessment & Plan Note (Signed)
Presumed related to acute illness. He will continue to monitor BP at home once feeling better and let me know if persistently >140/90.

## 2023-04-06 ENCOUNTER — Other Ambulatory Visit: Payer: Self-pay | Admitting: Family Medicine

## 2023-04-06 DIAGNOSIS — M961 Postlaminectomy syndrome, not elsewhere classified: Secondary | ICD-10-CM | POA: Diagnosis not present

## 2023-04-06 DIAGNOSIS — M25511 Pain in right shoulder: Secondary | ICD-10-CM | POA: Diagnosis not present

## 2023-04-06 DIAGNOSIS — M50323 Other cervical disc degeneration at C6-C7 level: Secondary | ICD-10-CM | POA: Diagnosis not present

## 2023-04-06 DIAGNOSIS — M4802 Spinal stenosis, cervical region: Secondary | ICD-10-CM | POA: Diagnosis not present

## 2023-04-08 NOTE — Telephone Encounter (Signed)
Pt currently takes Dexilant 60 mg daily (see 01/18/23 OV notes). Rx sent 01/18/23, #90/4 to [facility].  Request denied.

## 2023-05-30 ENCOUNTER — Other Ambulatory Visit: Payer: Self-pay | Admitting: Family Medicine

## 2023-05-30 DIAGNOSIS — F4321 Adjustment disorder with depressed mood: Secondary | ICD-10-CM

## 2023-05-30 NOTE — Telephone Encounter (Signed)
Patient last office visit for CPE in June noted that he was taking. Did not see any notation of changes. Ok to fill?

## 2023-05-31 NOTE — Telephone Encounter (Signed)
ERx 

## 2023-06-08 DIAGNOSIS — D485 Neoplasm of uncertain behavior of skin: Secondary | ICD-10-CM | POA: Diagnosis not present

## 2023-06-08 DIAGNOSIS — L570 Actinic keratosis: Secondary | ICD-10-CM | POA: Diagnosis not present

## 2023-06-08 DIAGNOSIS — C44629 Squamous cell carcinoma of skin of left upper limb, including shoulder: Secondary | ICD-10-CM | POA: Diagnosis not present

## 2023-06-27 ENCOUNTER — Other Ambulatory Visit: Payer: Self-pay | Admitting: Pulmonary Disease

## 2023-07-04 DIAGNOSIS — D485 Neoplasm of uncertain behavior of skin: Secondary | ICD-10-CM | POA: Diagnosis not present

## 2023-07-04 DIAGNOSIS — C44629 Squamous cell carcinoma of skin of left upper limb, including shoulder: Secondary | ICD-10-CM | POA: Diagnosis not present

## 2023-07-24 ENCOUNTER — Other Ambulatory Visit: Payer: Self-pay | Admitting: Family Medicine

## 2023-07-25 ENCOUNTER — Telehealth: Payer: Self-pay | Admitting: Family Medicine

## 2023-07-25 NOTE — Telephone Encounter (Signed)
Too soon. Rx sent on 01/18/23, #90/4 to [facility].   Request denied.

## 2023-07-25 NOTE — Telephone Encounter (Signed)
Prescription Request  07/25/2023  LOV: 01/18/2023  What is the name of the medication or equipment? dexlansoprazole (DEXILANT) 60 MG capsule   Have you contacted your pharmacy to request a refill? Yes   Which pharmacy would you like this sent to?  [id number], [place] - [address] [place] [address] Phone: [phone] Fax: [phone]    Patient notified that their request is being sent to the clinical staff for review and that they should receive a response within 2 business days.   Please advise at [facility] [phone]

## 2023-07-26 DIAGNOSIS — D0462 Carcinoma in situ of skin of left upper limb, including shoulder: Secondary | ICD-10-CM | POA: Diagnosis not present

## 2023-07-26 NOTE — Telephone Encounter (Signed)
Rx sent 01/18/23, #90/4. Should have refills available.  Spoke with [facility] asking about rx above. States pt does have refills available. However, he just picked up refill on 05/17/23 so insurance co says it's too early to fill now.   Spoke pt's wife, Michael [name] (on dpr), relaying message above from pharmacy. She verbalizes understanding but states she doesn't see a bottle of that med when he last picked up from pharmacy. Recommended she and/or [facility] to [facility]. Michael [name] verbalizes understanding.

## 2023-08-04 ENCOUNTER — Other Ambulatory Visit: Payer: Self-pay | Admitting: Acute Care

## 2023-08-04 DIAGNOSIS — Z122 Encounter for screening for malignant neoplasm of respiratory organs: Secondary | ICD-10-CM

## 2023-08-04 DIAGNOSIS — Z87891 Personal history of nicotine dependence: Secondary | ICD-10-CM

## 2023-08-30 ENCOUNTER — Ambulatory Visit: Payer: Medicare PPO | Admitting: Pulmonary Disease

## 2023-09-19 ENCOUNTER — Ambulatory Visit
Admission: RE | Admit: 2023-09-19 | Discharge: 2023-09-19 | Disposition: A | Discharge status: Home / Self Care | Payer: Medicare PPO | Source: Ambulatory Visit | Attending: Acute Care | Admitting: Acute Care

## 2023-09-19 DIAGNOSIS — Z87891 Personal history of nicotine dependence: Secondary | ICD-10-CM | POA: Diagnosis not present

## 2023-09-19 DIAGNOSIS — Z122 Encounter for screening for malignant neoplasm of respiratory organs: Secondary | ICD-10-CM | POA: Diagnosis not present

## 2023-09-19 DIAGNOSIS — F17210 Nicotine dependence, cigarettes, uncomplicated: Secondary | ICD-10-CM | POA: Diagnosis not present

## 2023-09-21 ENCOUNTER — Encounter: Payer: Self-pay | Admitting: Pulmonary Disease

## 2023-09-21 ENCOUNTER — Ambulatory Visit: Payer: Medicare PPO | Admitting: Pulmonary Disease

## 2023-09-21 VITALS — BP 124/80 | HR 111 | Temp 97.1°F | Ht 67.0 in | Wt 154.2 lb

## 2023-09-21 DIAGNOSIS — Z148 Genetic carrier of other disease: Secondary | ICD-10-CM

## 2023-09-21 DIAGNOSIS — J4489 Other specified chronic obstructive pulmonary disease: Secondary | ICD-10-CM | POA: Diagnosis not present

## 2023-09-21 DIAGNOSIS — J432 Centrilobular emphysema: Secondary | ICD-10-CM | POA: Diagnosis not present

## 2023-09-21 DIAGNOSIS — F17210 Nicotine dependence, cigarettes, uncomplicated: Secondary | ICD-10-CM | POA: Diagnosis not present

## 2023-09-21 MED ORDER — ALBUTEROL SULFATE HFA 108 (90 BASE) MCG/ACT IN AERS: 2.0000 | INHALATION_SPRAY | Freq: Four times a day (QID) | RESPIRATORY_TRACT | 2 refills | Status: AC | PRN

## 2023-09-21 NOTE — Patient Instructions (Signed)
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 We also talked about the importance of smoking cessation and reviewed potential cost savings for your medications with Medicare as you approach your 63th birthday.  YOUR PLAN:  -CHRONIC OBSTRUCTIVE PULMONARY DISEASE (COPD) SECONDARY TO EMPHYSEMA: COPD is a chronic lung condition that makes it hard to breathe. Your condition is stable with your current medications, Trelegy and albuterol. We refilled your albuterol inhaler and encourage you to continue using Trelegy daily. It's important to stop smoking to prevent further damage to your lungs.  -SMOKING CESSATION: Smoking is significantly affecting your lung health and worsening your COPD. Quitting smoking is crucial to improve your breathing and overall health. We discussed various strategies and resources to help you quit, including counseling and medications.  -GENERAL HEALTH MAINTENANCE: As you approach your 63th birthday and transition to Medicare, you may see potential cost savings on your medications. We discussed how Medicare might help reduce your medication expenses.  INSTRUCTIONS:  Please schedule a follow-up appointment in six months. If you experience any worsening of your symptoms or have any concerns before then, do not hesitate to contact us.

## 2023-09-21 NOTE — Progress Notes (Signed)
Subjective:    Patient ID: Michael [name], male    DOB: 1961-01-26, 63 y.o.   MRN: [id number]  Patient Care Team: Eustaquio Boyden, MD as PCP - General (Family Medicine) Salena Saner, MD as Consulting Physician (Pulmonary Disease)  Chief Complaint  Patient presents with   Follow-up    DOE. Occasional wheezing. Cough with white sputum.    BACKGROUND/INTERVAL: Michael [name] is a 63 year old current smoker (1 PPD, 40 PY) who presents for follow-up on the issue of COPD on the basis of emphysema he also follows for shortness of breath. He was last seen on 02 December 2022.  No exacerbations in the interim.   HPI Discussed the use of AI scribe software for clinical note transcription with the patient, who gave verbal consent to proceed.  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 His breathing has been stable, and he continues to use Trelegy every morning. His emergency inhaler, albuterol, is about to expire, although he has not needed it recently.  He continues to smoke about a pack of cigarettes per day. His mother, who passed away from cancer at sixty-five, used to buy cigarettes for him when he was [age]. He reflects on past experiences related to tobacco use in his family.  He is under significant stress as he is caring for his parents. His father, who is 67 years old, is hospitalized on dialysis and has an infection, with challenges including inability to swallow and poor nutritional intake. He also manages responsibilities at home with his mother-in-law, adding to his stress.   He had low-dose CT chest on 19 September 2023 for lung cancer screening.  Radiologist has not interpreted however on my independent review it does not appear to have any new  issues.  Formal report pending.    DATA 06/28/2022 alpha-1 antitrypsin: Phenotype MS, level 155 mg/dL (reference 782 to 956 mg/dL).  Patient is a carrier but does not have disease. 07/13/2022 PFTs: FEV1 2.03 L or 62% predicted, FVC 3.39 L or 78% predicted, FEV1/FVC 60% predicted, he had a very significant bronchodilator response with 33% net change in FEV1 postbronchodilator.  Lung volumes normal.  Diffusion capacity results invalid due to error code during testing.  Consistent with moderate obstructive airways disease with reversible component (COPD asthma overlap). 09/15/2022 LDCT chest: Lung RADS 2, severe paraseptal and centrilobular emphysema.  No consolidation, pleural effusion or pneumothorax.  Previously noted solid pulmonary nodule in the left lower lobe has decreased in size measuring 2.1 mm (4.1 mm prior) other bilateral solid pulmonary nodules stable.  Review of Systems A 10 point review of systems was performed and it is as noted above otherwise negative.   Patient Active Problem List   Diagnosis Date Noted   Elevated blood pressure reading without diagnosis of hypertension 04/01/2023   Medicare annual wellness visit, subsequent 01/18/2023   Vitamin D deficiency 01/08/2023   COVID-19 virus infection 03/15/2022   Advanced directives, counseling/discussion 12/24/2020   Papilledema 04/02/2020   Pulmonary nodules 02/12/2020   Emphysema of lung (HCC) 02/12/2020   Constipation due to pain medication 10/16/2019   BPH (benign prostatic hyperplasia) 07/11/2018   Right inguinal hernia 07/11/2018   Cervical stenosis of spine 01/02/2018   HNP (herniated nucleus pulposus), cervical 09/30/2016   Dyslipidemia  02/29/2016   History of kidney stones 03/06/2014   Acute sinusitis 09/01/2012   Healthcare maintenance 01/28/2012   Situational depression 10/08/2009   GERD 05/23/2009   History of colonic polyps 05/23/2009   INSOMNIA 02/04/2009   TOBACCO ABUSE 05/01/2007    Social History    Tobacco Use   Smoking status: Every Day    Current packs/day: 1.00    Average packs/day: 1 pack/day for 40.0 years (40.0 ttl pk-yrs)    Types: Cigarettes   Smokeless tobacco: Never   Tobacco comments:    1 PPD- khj 09/21/2023  Substance Use Topics   Alcohol use: No    No Known Allergies  Current Meds  Medication Sig   albuterol (VENTOLIN HFA) 108 (90 Base) MCG/ACT inhaler Inhale 2 puffs into the lungs every 6 (six) hours as needed.   Bioflavonoid Products (ESTER C PO) Take by mouth daily.   Cholecalciferol (VITAMIN D3 PO) Take by mouth.   cyanocobalamin 100 MCG tablet Take 100 mcg by mouth daily.    dexlansoprazole (DEXILANT) 60 MG capsule Take 1 capsule (60 mg total) by mouth daily.   fish oil-omega-3 fatty acids 1000 MG capsule Take 2 g by mouth daily.   fluticasone (FLONASE) 50 MCG/ACT nasal spray SPRAY 2 SPRAYS INTO EACH NOSTRIL EVERY DAY   guaiFENesin-codeine (ROBITUSSIN AC) 100-10 MG/5ML syrup Take 5 mLs by mouth 3 (three) times daily as needed for cough (sedation precautions).   HORIZANT 300 MG TBCR Take 1 tablet by mouth at bedtime.   Multiple Vitamin (MULTIVITAMIN) tablet Take 1 tablet by mouth daily.   nortriptyline (PAMELOR) 50 MG capsule TAKE 1 CAPSULE BY MOUTH EVERYDAY AT BEDTIME   oxyCODONE-acetaminophen (PERCOCET/ROXICET) 5-325 MG tablet Take 1 tablet by mouth at bedtime.   Probiotic Product (PROBIOTIC PO) Take by mouth.   tiZANidine (ZANAFLEX) 2 MG tablet Take 2 tablets by mouth at bedtime. Takes as needed   TRELEGY ELLIPTA 100-62.5-25 MCG/ACT AEPB TAKE 1 PUFF BY MOUTH EVERY DAY   Zinc 30 MG TABS Take 1 tablet by mouth daily.    Immunization History  Administered Date(s) Administered   Influenza,inj,Quad PF,6+ Mos 07/11/2018   Influenza-Unspecified 05/09/2014, 05/26/2016, 05/16/2017   PFIZER(Purple Top)SARS-COV-2 Vaccination 11/02/2019, 11/27/2019   Td 08/10/2002   Tdap 07/25/2014        Objective:     BP 124/80 (BP Location: Right Arm, Cuff Size:  Normal)   Pulse (!) 111   Temp (!) 97.1 F (36.2 C)   Ht 5\' 7"  (1.702 m)   Wt 154 lb 3.2 oz (69.9 kg)   SpO2 95%   BMI 24.15 kg/m   SpO2: 95 % O2 Device: None (Room air)  GENERAL: Well-developed, well-nourished gentleman, no acute distress.  Fully ambulatory. HEAD: Normocephalic, atraumatic.  EYES: Pupils equal, round, reactive to light.  No scleral icterus.  MOUTH: Oral mucosa moist.  No thrush. NECK: Supple. No thyromegaly. Trachea midline. No JVD.  No adenopathy. PULMONARY: Good air entry bilaterally.  Coarse otherwise, no adventitious sounds. CARDIOVASCULAR: S1 and S2. Regular rate and rhythm.  No rubs, murmurs or gallops heard.  Recheck pulse rate was 80. ABDOMEN: Benign. MUSCULOSKELETAL: Good muscle tone.  No joint deformity, no clubbing, no edema.  NEUROLOGIC: No overt focal deficit, no gait disturbance, speech is fluent. SKIN: Intact,warm,dry. PSYCH: Mood and behavior normal.   Assessment & Plan:     ICD-10-CM   1. COPD with asthma (HCC)  J44.89     2. Centrilobular emphysema (HCC)  J43.2  3. Alpha-1-antitrypsin deficiency carrier  Z14.8     4. Tobacco dependence due to cigarettes  F17.210      Meds ordered this encounter  Medications   albuterol (VENTOLIN HFA) 108 (90 Base) MCG/ACT inhaler    Sig: Inhale 2 puffs into the lungs every 6 (six) hours as needed.    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 He is managed on Trelegy and uses albuterol as a rescue inhaler. His recent CT scan shows no new findings, indicating well-managed disease. Continued smoking is a significant risk factor exacerbating his condition. Discussed the importance of smoking cessation to prevent further deterioration of lung function and improve overall health outcomes. - Refill albuterol inhaler - Continue Trelegy - Encourage smoking cessation -  Follow up in six months unless needed earlier  Smoking Cessation Michael [name] smokes about a pack of cigarettes per day, significantly impacting his COPD and overall lung health. Smoking cessation is crucial to prevent further deterioration of his lung function. Discussed various smoking cessation strategies and resources, including counseling and pharmacotherapy options. - Discuss smoking cessation strategies and resources  General Health Maintenance Michael [name] is on Medicare and will turn 69 in August, which may affect his medication costs and coverage. Discussed potential cost savings for medications with Medicare at age 38. - Provide information on potential cost savings for medications with Medicare at age 62  Follow-up - Schedule follow-up appointment in six months.     Advised if symptoms do not improve or worsen, to please contact office for sooner follow up or seek emergency care.    I spent 33 minutes of dedicated to the care of this patient on the date of this encounter to include pre-visit review of records, face-to-face time with the patient discussing conditions above, post visit ordering of testing, clinical documentation with the electronic health record, making appropriate referrals as documented, and communicating necessary findings to members of the patients care team.     C. Danice Goltz, MD Advanced Bronchoscopy PCCM [facility]    *This note was generated using voice recognition software/Dragon and/or AI transcription program.  Despite best efforts to proofread, errors can occur which can change the meaning. Any transcriptional errors that result from this process are unintentional and may not be fully corrected at the time of dictation.

## 2023-10-03 ENCOUNTER — Other Ambulatory Visit: Payer: Self-pay | Admitting: Acute Care

## 2023-10-03 DIAGNOSIS — Z122 Encounter for screening for malignant neoplasm of respiratory organs: Secondary | ICD-10-CM

## 2023-10-03 DIAGNOSIS — Z87891 Personal history of nicotine dependence: Secondary | ICD-10-CM

## 2023-10-03 DIAGNOSIS — F17210 Nicotine dependence, cigarettes, uncomplicated: Secondary | ICD-10-CM

## 2023-12-26 ENCOUNTER — Other Ambulatory Visit: Payer: Self-pay | Admitting: Family Medicine

## 2023-12-26 DIAGNOSIS — F4321 Adjustment disorder with depressed mood: Secondary | ICD-10-CM

## 2024-01-10 ENCOUNTER — Ambulatory Visit (INDEPENDENT_AMBULATORY_CARE_PROVIDER_SITE_OTHER)

## 2024-01-10 ENCOUNTER — Other Ambulatory Visit: Payer: Self-pay | Admitting: Family Medicine

## 2024-01-10 VITALS — BP 124/80 | Ht 67.0 in | Wt 154.0 lb

## 2024-01-10 DIAGNOSIS — Z2821 Immunization not carried out because of patient refusal: Secondary | ICD-10-CM

## 2024-01-10 DIAGNOSIS — Z0000 Encounter for general adult medical examination without abnormal findings: Secondary | ICD-10-CM | POA: Diagnosis not present

## 2024-01-10 DIAGNOSIS — Z5320 Procedure and treatment not carried out because of patient's decision for unspecified reasons: Secondary | ICD-10-CM | POA: Diagnosis not present

## 2024-01-10 NOTE — Telephone Encounter (Signed)
 Rx sent 12/26/23, #48 mL (90-day supply)/0 refills to [facility]. This is more than enough to get pt to his 02/20/24 CPE, when a 1 yr rx will be sent in.   Spoke with pt relaying info above. Pt states he never got notification from the pharmacy and expresses his thanks for letting him know. He will check with the pharmacy.   Unpinned request.

## 2024-01-10 NOTE — Progress Notes (Signed)
 Because this visit was a virtual/telehealth visit,  certain criteria was not obtained, such a blood pressure, CBG if applicable, and timed get up and go. Any medications not marked as "taking" were not mentioned during the medication reconciliation part of the visit. Any vitals not documented were not able to be obtained due to this being a telehealth visit or patient was unable to self-report a recent blood pressure reading due to a lack of equipment at home via telehealth. Vitals that have been documented are verbally provided by the patient.   This visit was performed by a medical professional under my direct supervision. I was immediately available for consultation/collaboration. I have reviewed and agree with the Annual Wellness Visit documentation.  Subjective:   Michael [name] is a 63 y.o. who presents for a Medicare Wellness preventive visit.  As a reminder, Annual Wellness Visits don't include a physical exam, and some assessments may be limited, especially if this visit is performed virtually. We may recommend an in-person follow-up visit with your provider if needed.  Visit Complete: Virtual I connected with  Michael [name] on 01/10/24 by a audio enabled telemedicine application and verified that I am speaking with the correct person using two identifiers.  Patient Location: Home  Provider Location: Home Office  I discussed the limitations of evaluation and management by telemedicine. The patient expressed understanding and agreed to proceed.  Vital Signs: Because this visit was a virtual/telehealth visit, some criteria may be missing or patient reported. Any vitals not documented were not able to be obtained and vitals that have been documented are patient reported.  VideoDeclined- This patient declined Librarian, academic. Therefore the visit was completed with audio only.  Persons Participating in Visit: Patient.  AWV Questionnaire: No: Patient  Medicare AWV questionnaire was not completed prior to this visit.  Cardiac Risk Factors include: advanced age (>82men, >15 women);male gender;smoking/ tobacco exposure     Objective:     Today's Vitals   01/10/24 1448  BP: 124/80  Weight: 154 lb (69.9 kg)  Height: 5\' 7"  (1.702 m)   Body mass index is 24.12 kg/m.     01/10/2024    2:52 PM 10/31/2022    1:57 PM 12/18/2021    2:15 PM 12/17/2020   12:04 PM 01/30/2019   12:22 PM 11/16/2013    1:32 PM  Advanced Directives  Does Patient Have a Medical Advance Directive? No No No No No Patient does not have advance directive  Would patient like information on creating a medical advance directive? No - Patient declined   No - Patient declined No - Patient declined     Current Medications (verified) Outpatient Encounter Medications as of 01/10/2024  Medication Sig   albuterol  (VENTOLIN  HFA) 108 (90 Base) MCG/ACT inhaler Inhale 2 puffs into the lungs every 6 (six) hours as needed.   Bioflavonoid Products (ESTER C PO) Take by mouth daily.   Cholecalciferol (VITAMIN D3 PO) Take by mouth.   cyanocobalamin 100 MCG tablet Take 100 mcg by mouth daily.    dexlansoprazole  (DEXILANT ) 60 MG capsule Take 1 capsule (60 mg total) by mouth daily.   fish oil-omega-3 fatty acids 1000 MG capsule Take 2 g by mouth daily.   fluticasone  (FLONASE ) 50 MCG/ACT nasal spray SPRAY 2 SPRAYS INTO EACH NOSTRIL EVERY DAY   guaiFENesin -codeine  (ROBITUSSIN AC) 100-10 MG/5ML syrup Take 5 mLs by mouth 3 (three) times daily as needed for cough (sedation precautions).   HORIZANT 300 MG TBCR Take  1 tablet by mouth at bedtime.   Multiple Vitamin (MULTIVITAMIN) tablet Take 1 tablet by mouth daily.   nortriptyline  (PAMELOR ) 50 MG capsule TAKE 1 CAPSULE BY MOUTH EVERYDAY AT BEDTIME   oxyCODONE -acetaminophen (PERCOCET/ROXICET) 5-325 MG tablet Take 1 tablet by mouth at bedtime.   Probiotic Product (PROBIOTIC PO) Take by mouth.   tiZANidine (ZANAFLEX) 2 MG tablet Take 2 tablets by  mouth at bedtime. Takes as needed   TRELEGY ELLIPTA  100-62.5-25 MCG/ACT AEPB TAKE 1 PUFF BY MOUTH EVERY DAY   Zinc 30 MG TABS Take 1 tablet by mouth daily.   nicotine  (NICOTROL ) 10 MG inhaler INHALE 1 CARTRIDGE (1 CONTINUOUS PUFFING TOTAL) INTO THE LUNGS AS NEEDED FOR SMOKING CESSATION. (Patient not taking: Reported on 04/01/2023)   No facility-administered encounter medications on file as of 01/10/2024.    Allergies (verified) Patient has no known allergies.   History: Past Medical History:  Diagnosis Date   Adenomatous colon polyp 02/2008   AK (actinic keratosis)    Allergy    Basal cell carcinoma of face    Mohs procedure   COPD (chronic obstructive pulmonary disease) (HCC)    COVID-19 virus infection 03/15/2022   Dyslipidemia 02/29/2016   Emphysema lung (HCC)    Esophagitis 02/13/2001   Gastritis 02/13/2001   GERD (gastroesophageal reflux disease)    Hemorrhoids    Hiatal hernia    HNP (herniated nucleus pulposus), cervical 09/30/2016   With radiculopathy, s/p anterior discectomy Michael [name]) 12/2016   Internal hemorrhoids    Reactive depression (situational)    Renal stones    Smoker    Past Surgical History:  Procedure Laterality Date   ANTERIOR CERVICAL DISCECTOMY  12/2016   C5/6 (Dr Michael [name])   COLONOSCOPY  02/14/2003   polyps, ext hemorrhoids   COLONOSCOPY  2009   polyps, rec rpt 5 yrs   COLONOSCOPY  2015   5 polyps, rec rpt 5 yrs Michael [name])   COLONOSCOPY  01/2019   multiple polyps including TAs, rpt 2-3 yrs Michael [name])   COLONOSCOPY  03/2021   TA/HP, rpt 3 yrs Michael [name])   ESOPHAGOGASTRODUODENOSCOPY  02/14/2003   esophagitis, gastritis, duodenitis without hemorrhage   ESOPHAGOGASTRODUODENOSCOPY  01/2019   chronic gastritis, no H pylori Michael [name])   MOHS SURGERY     POLYPECTOMY     TONSILLECTOMY     Family History  Problem Relation Age of Onset   Hypertension Father    Gout Father    GER disease Father    Colon polyps Father    Breast cancer Mother    Colon polyps  Brother    Glaucoma Brother        early onset   Heart attack Paternal Grandfather    Transient ischemic attack Maternal Grandfather    Diabetes Neg Hx    Colon cancer Neg Hx    Esophageal cancer Neg Hx    Rectal cancer Neg Hx    Stomach cancer Neg Hx    Social History   Socioeconomic History   Marital status: Married    Spouse name: Not on file   Number of children: 0   Years of education: Not on file   Highest education level: Not on file  Occupational History   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  Tobacco Use   Smoking status: Every Day    Current packs/day: 1.00    Average packs/day: 1 pack/day for 40.0 years (40.0 ttl pk-yrs)    Types: Cigarettes   Smokeless tobacco: Never   Tobacco comments:  1 PPD- khj 09/21/2023  Vaping Use   Vaping status: Never Used  Substance and Sexual Activity   Alcohol use: No   Drug use: No   Sexual activity: Not on file  Other Topics Concern   Not on file  Social History Narrative   Caffeine: 1 cup coffee/yda, Dr Kathlene Paradise throughout the day   Lives with wife   Occupation: HVAC   Activity: work, Armed forces training and education officer, mows yard   Diet: good water, fruits/vegetables daily   Social Drivers of Corporate investment banker Strain: Low Risk  (01/10/2024)   Overall Financial Resource Strain (CARDIA)    Difficulty of Paying Living Expenses: Not hard at all  Food Insecurity: No Food Insecurity (01/10/2024)   Hunger Vital Sign    Worried About Running Out of Food in the Last Year: Never true    Ran Out of Food in the Last Year: Never true  Transportation Needs: No Transportation Needs (01/10/2024)   PRAPARE - Administrator, Civil Service (Medical): No    Lack of Transportation (Non-Medical): No  Physical Activity: Insufficiently Active (01/10/2024)   Exercise Vital Sign    Days of Exercise per Week: 3 days    Minutes of Exercise per Session: 40 min  Stress: No Stress Concern Present (01/10/2024)   [employer] of Occupational Health -  Occupational Stress Questionnaire    Feeling of Stress : Not at all  Social Connections: Socially Integrated (01/10/2024)   Social Connection and Isolation Panel [NHANES]    Frequency of Communication with Friends and Family: More than three times a week    Frequency of Social Gatherings with Friends and Family: More than three times a week    Attends Religious Services: More than 4 times per year    Active Member of [employer] or Organizations: Yes    Attends Engineer, structural: More than 4 times per year    Marital Status: Married    Tobacco Counseling Ready to quit: Not Answered Counseling given: Not Answered Tobacco comments: 1 PPD- khj 09/21/2023    Clinical Intake:     Pain : No/denies pain     BMI - recorded: 24.12 Nutritional Status: BMI of 19-24  Normal Nutritional Risks: None Diabetes: No  No results found for: "HGBA1C"   How often do you need to have someone help you when you read instructions, pamphlets, or other written materials from your doctor or pharmacy?: 1 - Never  Interpreter Needed?: No  Information entered by :: Juliann Ochoa   Activities of Daily Living     01/10/2024    2:52 PM  In your present state of health, do you have any difficulty performing the following activities:  Hearing? 0  Vision? 0  Difficulty concentrating or making decisions? 0  Walking or climbing stairs? 0  Dressing or bathing? 0  Doing errands, shopping? 0  Preparing Food and eating ? N  Using the Toilet? N  In the past six months, have you accidently leaked urine? N  Do you have problems with loss of bowel control? N  Managing your Medications? N  Managing your Finances? N  Housekeeping or managing your Housekeeping? N    Patient Care Team: Claire Crick, MD as PCP - General (Family Medicine) Marc Senior, MD as Consulting Physician (Pulmonary Disease)  I have updated your Care Teams any recent Medical Services you may have received from  other providers in the past year.     Assessment:  This is a routine wellness examination for Michael [name].  Hearing/Vision screen Hearing Screening - Comments:: Patient has no difficulties hearing  Vision Screening - Comments:: Patient has had no difficulties seeing    Goals Addressed             This Visit's Progress    Patient Stated       Patient wants to keep moving       Depression Screen     01/10/2024    2:53 PM 01/18/2023   12:58 PM 12/15/2022    3:31 PM 11/02/2022    2:08 PM 12/18/2021    2:16 PM 12/17/2020   12:05 PM 01/09/2019    2:46 PM  PHQ 2/9 Scores  PHQ - 2 Score 0 0 0 2 0 0 1  PHQ- 9 Score 0 1 3 7   0 3    Fall Risk     01/10/2024    2:52 PM 01/18/2023   12:58 PM 12/15/2022    3:31 PM 11/02/2022    2:08 PM 12/18/2021    2:16 PM  Fall Risk   Falls in the past year? 0 0 0 0 0  Number falls in past yr: 0    0  Injury with Fall? 0    0  Risk for fall due to : No Fall Risks    Medication side effect  Follow up Falls evaluation completed    Falls evaluation completed;Education provided;Falls prevention discussed    MEDICARE RISK AT HOME:  Medicare Risk at Home Any stairs in or around the home?: Yes If so, are there any without handrails?: No Home free of loose throw rugs in walkways, pet beds, electrical cords, etc?: Yes Adequate lighting in your home to reduce risk of falls?: Yes Life alert?: No Use of a cane, walker or w/c?: No Grab bars in the bathroom?: Yes Shower chair or bench in shower?: Yes Elevated toilet seat or a handicapped toilet?: Yes  TIMED UP AND GO:  Was the test performed?  No  Cognitive Function: 6CIT completed    12/17/2020   12:07 PM  MMSE - Mini Mental State Exam  Not completed: Refused        01/10/2024    2:50 PM 12/18/2021    2:17 PM  6CIT Screen  What Year? 0 points 0 points  What month? 0 points 0 points  What time? 0 points 0 points  Count back from 20 0 points 0 points  Months in reverse 0 points 2 points  Repeat  phrase 0 points 6 points  Total Score 0 points 8 points    Immunizations Immunization History  Administered Date(s) Administered   Influenza,inj,Quad PF,6+ Mos 07/11/2018   Influenza-Unspecified 05/09/2014, 05/26/2016, 05/16/2017   PFIZER(Purple Top)SARS-COV-2 Vaccination 11/02/2019, 11/27/2019   Td 08/10/2002   Tdap 07/25/2014    Screening Tests Health Maintenance  Topic Date Due   HIV Screening  Never done   Pneumococcal Vaccine 2-16 Years old (1 of 2 - PCV) Never done   Zoster Vaccines- Shingrix (1 of 2) Never done   COVID-19 Vaccine (3 - 2024-25 season) 04/10/2023   Colonoscopy  03/09/2024   INFLUENZA VACCINE  03/09/2024   DTaP/Tdap/Td (3 - Td or Tdap) 07/25/2024   Lung Cancer Screening  09/18/2024   Medicare Annual Wellness (AWV)  01/09/2025   Hepatitis C Screening  Completed   HPV VACCINES  Aged Out   Meningococcal B Vaccine  Aged Out    Health Maintenance  Health Maintenance Due  Topic Date Due   HIV Screening  Never done   Pneumococcal Vaccine 99-72 Years old (1 of 2 - PCV) Never done   Zoster Vaccines- Shingrix (1 of 2) Never done   COVID-19 Vaccine (3 - 2024-25 season) 04/10/2023   Colonoscopy  03/09/2024   Health Maintenance Items Addressed:patient declined health maintenance  Additional Screening:  Vision Screening: Recommended annual ophthalmology exams for early detection of glaucoma and other disorders of the eye. Would you like a referral to an eye doctor? No    Dental Screening: Recommended annual dental exams for proper oral hygiene  Community Resource Referral / Chronic Care Management: CRR required this visit?  No   CCM required this visit?  No   Plan:    I have personally reviewed and noted the following in the patient's chart:   Medical and social history Use of alcohol, tobacco or illicit drugs  Current medications and supplements including opioid prescriptions. Patient is not currently taking opioid prescriptions. Functional  ability and status Nutritional status Physical activity Advanced directives List of other physicians Hospitalizations, surgeries, and ER visits in previous 12 months Vitals Screenings to include cognitive, depression, and falls Referrals and appointments  In addition, I have reviewed and discussed with patient certain preventive protocols, quality metrics, and best practice recommendations. A written personalized care plan for preventive services as well as general preventive health recommendations were provided to patient.   Freeda Jerry, [place]   01/10/2024   After Visit Summary: (MyChart) Due to this being a telephonic visit, the after visit summary with patients personalized plan was offered to patient via MyChart   Notes: Nothing significant to report at this time.

## 2024-01-10 NOTE — Telephone Encounter (Signed)
 Copied from CRM [id number]. Topic: Clinical - Medication Refill >> Jan 10, 2024  3:03 PM Kita Perish H wrote: Medication: fluticasone  (FLONASE ) 50 MCG/ACT nasal spray  Has the patient contacted their pharmacy? Yes, not listed. Medication shows discontinued (Agent: If no, request that the patient contact the pharmacy for the refill. If patient does not wish to contact the pharmacy document the reason why and proceed with request.) (Agent: If yes, when and what did the pharmacy advise?)  This is the patient's preferred pharmacy:  [facility] [phone] [facility], [place] - [address] [place] [address] Phone: [phone] Fax: [phone]  Is this the correct pharmacy for this prescription? Yes If no, delete pharmacy and type the correct one.   Has the prescription been filled recently? No  Is the patient out of the medication? Yes  Has the patient been seen for an appointment in the last year OR does the patient have an upcoming appointment? Yes  Can we respond through MyChart? No  Agent: Please be advised that Rx refills may take up to 3 business days. We ask that you follow-up with your pharmacy.

## 2024-01-10 NOTE — Patient Instructions (Addendum)
 [name] , Thank you for taking time out of your busy schedule to complete your Annual Wellness Visit with me. I enjoyed our conversation and look forward to speaking with you again next year. I, as well as your care team,  appreciate your ongoing commitment to your health goals. Please review the following plan we discussed and let me know if I can assist you in the future. Your Game plan/ To Do List    Referrals: If you haven't heard from the office you've been referred to, please reach out to them at the phone provided.   Follow up Visits: Next Medicare AWV with our clinical staff:  01/11/2024 Have you seen your provider in the last 6 months (3 months if uncontrolled diabetes)? No Next Office Visit with your provider: 02/20/2024  Clinician Recommendations:  Aim for 30 minutes of exercise or brisk walking, 6-8 glasses of water, and 5 servings of fruits and vegetables each day.       This is a list of the screening recommended for you and due dates:  Health Maintenance  Topic Date Due   HIV Screening  Never done   Pneumococcal Vaccination (1 of 2 - PCV) Never done   Zoster (Shingles) Vaccine (1 of 2) Never done   COVID-19 Vaccine (3 - 2024-25 season) 04/10/2023   Colon Cancer Screening  03/09/2024   Flu Shot  03/09/2024   DTaP/Tdap/Td vaccine (3 - Td or Tdap) 07/25/2024   Screening for Lung Cancer  09/18/2024   Medicare Annual Wellness Visit  01/09/2025   Hepatitis C Screening  Completed   HPV Vaccine  Aged Out   Meningitis B Vaccine  Aged Out    Advanced directives: (Declined) Advance directive discussed with you today. Even though you declined this today, please call our office should you change your mind, and we can give you the proper paperwork for you to fill out. Advance Care Planning is important because it  [x]  Makes sure you receive the medical care that is consistent with your values, goals, and preferences  [x]  It provides guidance to your family and loved ones and  reduces their decisional burden about whether or not they are making the right decisions based on your wishes.  Follow the link provided in your after visit summary or read over the paperwork we have mailed to you to help you started getting your Advance Directives in place. If you need assistance in completing these, please reach out to us  so that we can help you!  See attachments for Preventive Care and Fall Prevention Tips.

## 2024-01-31 ENCOUNTER — Other Ambulatory Visit: Payer: Self-pay | Admitting: Family Medicine

## 2024-01-31 DIAGNOSIS — K2100 Gastro-esophageal reflux disease with esophagitis, without bleeding: Secondary | ICD-10-CM

## 2024-02-07 ENCOUNTER — Ambulatory Visit
Admission: RE | Admit: 2024-02-07 | Discharge: 2024-02-07 | Disposition: A | Discharge status: Home / Self Care | Source: Ambulatory Visit | Attending: Acute Care | Admitting: Acute Care

## 2024-02-07 DIAGNOSIS — Z87891 Personal history of nicotine dependence: Secondary | ICD-10-CM | POA: Insufficient documentation

## 2024-02-07 DIAGNOSIS — Z122 Encounter for screening for malignant neoplasm of respiratory organs: Secondary | ICD-10-CM | POA: Diagnosis present

## 2024-02-07 DIAGNOSIS — F17210 Nicotine dependence, cigarettes, uncomplicated: Secondary | ICD-10-CM | POA: Insufficient documentation

## 2024-02-11 ENCOUNTER — Other Ambulatory Visit: Payer: Self-pay | Admitting: Family Medicine

## 2024-02-11 DIAGNOSIS — E785 Hyperlipidemia, unspecified: Secondary | ICD-10-CM

## 2024-02-11 DIAGNOSIS — Z125 Encounter for screening for malignant neoplasm of prostate: Secondary | ICD-10-CM

## 2024-02-11 DIAGNOSIS — E559 Vitamin D deficiency, unspecified: Secondary | ICD-10-CM

## 2024-02-13 ENCOUNTER — Other Ambulatory Visit (INDEPENDENT_AMBULATORY_CARE_PROVIDER_SITE_OTHER)

## 2024-02-13 ENCOUNTER — Ambulatory Visit: Payer: Self-pay | Admitting: Family Medicine

## 2024-02-13 DIAGNOSIS — Z125 Encounter for screening for malignant neoplasm of prostate: Secondary | ICD-10-CM | POA: Diagnosis not present

## 2024-02-13 DIAGNOSIS — E785 Hyperlipidemia, unspecified: Secondary | ICD-10-CM | POA: Diagnosis not present

## 2024-02-13 DIAGNOSIS — E559 Vitamin D deficiency, unspecified: Secondary | ICD-10-CM | POA: Diagnosis not present

## 2024-02-13 LAB — LIPID PANEL
Cholesterol: 194 mg/dL (ref 0–200)
HDL: 44.1 mg/dL (ref >39.00)
LDL Cholesterol: 106 mg/dL — ABNORMAL HIGH (ref 0–99)
NonHDL: 150.17
Total CHOL/HDL Ratio: 4
Triglycerides: 222 mg/dL — ABNORMAL HIGH (ref 0.0–149.0)
VLDL: 44.4 mg/dL — ABNORMAL HIGH (ref 0.0–40.0)

## 2024-02-13 LAB — COMPREHENSIVE METABOLIC PANEL WITH GFR
ALT: 18 U/L (ref 0–53)
AST: 16 U/L (ref 0–37)
Albumin: 4.4 g/dL (ref 3.5–5.2)
Alkaline Phosphatase: 119 U/L — ABNORMAL HIGH (ref 39–117)
BUN: 11 mg/dL (ref 6–23)
CO2: 28 meq/L (ref 19–32)
Calcium: 9.6 mg/dL (ref 8.4–10.5)
Chloride: 103 meq/L (ref 96–112)
Creatinine, Ser: 0.89 mg/dL (ref 0.40–1.50)
GFR: 90.31 mL/min (ref >60.00)
Glucose, Bld: 95 mg/dL (ref 70–99)
Potassium: 3.7 meq/L (ref 3.5–5.1)
Sodium: 139 meq/L (ref 135–145)
Total Bilirubin: 0.5 mg/dL (ref 0.2–1.2)
Total Protein: 6.7 g/dL (ref 6.0–8.3)

## 2024-02-13 LAB — PSA: PSA: 0.37 ng/mL (ref 0.10–4.00)

## 2024-02-13 LAB — VITAMIN D 25 HYDROXY (VIT D DEFICIENCY, FRACTURES): VITD: 44.09 ng/mL (ref 30.00–100.00)

## 2024-02-20 ENCOUNTER — Other Ambulatory Visit: Payer: Self-pay | Admitting: Acute Care

## 2024-02-20 ENCOUNTER — Encounter: Payer: Self-pay | Admitting: Family Medicine

## 2024-02-20 ENCOUNTER — Ambulatory Visit (INDEPENDENT_AMBULATORY_CARE_PROVIDER_SITE_OTHER): Admitting: Family Medicine

## 2024-02-20 VITALS — BP 118/80 | HR 93 | Temp 97.8°F | Ht 68.0 in | Wt 152.2 lb

## 2024-02-20 DIAGNOSIS — J439 Emphysema, unspecified: Secondary | ICD-10-CM

## 2024-02-20 DIAGNOSIS — F17200 Nicotine dependence, unspecified, uncomplicated: Secondary | ICD-10-CM | POA: Diagnosis not present

## 2024-02-20 DIAGNOSIS — F4321 Adjustment disorder with depressed mood: Secondary | ICD-10-CM

## 2024-02-20 DIAGNOSIS — Z7189 Other specified counseling: Secondary | ICD-10-CM | POA: Diagnosis not present

## 2024-02-20 DIAGNOSIS — Z122 Encounter for screening for malignant neoplasm of respiratory organs: Secondary | ICD-10-CM

## 2024-02-20 DIAGNOSIS — E559 Vitamin D deficiency, unspecified: Secondary | ICD-10-CM | POA: Diagnosis not present

## 2024-02-20 DIAGNOSIS — Z0000 Encounter for general adult medical examination without abnormal findings: Secondary | ICD-10-CM | POA: Diagnosis not present

## 2024-02-20 DIAGNOSIS — E785 Hyperlipidemia, unspecified: Secondary | ICD-10-CM

## 2024-02-20 DIAGNOSIS — Z87891 Personal history of nicotine dependence: Secondary | ICD-10-CM

## 2024-02-20 DIAGNOSIS — K2100 Gastro-esophageal reflux disease with esophagitis, without bleeding: Secondary | ICD-10-CM

## 2024-02-20 DIAGNOSIS — Z860100 Personal history of colon polyps, unspecified: Secondary | ICD-10-CM

## 2024-02-20 DIAGNOSIS — F17210 Nicotine dependence, cigarettes, uncomplicated: Secondary | ICD-10-CM

## 2024-02-20 MED ORDER — FLUTICASONE PROPIONATE 50 MCG/ACT NA SUSP: NASAL | 3 refills | Status: AC

## 2024-02-20 MED ORDER — VITAMIN D3 50 MCG (2000 UT) PO CAPS: 2000.0000 [IU] | ORAL_CAPSULE | Freq: Every day | ORAL | Status: AC

## 2024-02-20 MED ORDER — DEXLANSOPRAZOLE 60 MG PO CPDR: 60.0000 mg | DELAYED_RELEASE_CAPSULE | Freq: Every day | ORAL | 3 refills | Status: AC

## 2024-02-20 MED ORDER — NORTRIPTYLINE HCL 50 MG PO CAPS: 50.0000 mg | ORAL_CAPSULE | Freq: Every day | ORAL | 3 refills | Status: AC

## 2024-02-20 NOTE — Assessment & Plan Note (Addendum)
 Continues vit D 2000 units daily.

## 2024-02-20 NOTE — Assessment & Plan Note (Signed)
 Appreciate pulm care - continues Trelegy

## 2024-02-20 NOTE — Patient Instructions (Addendum)
 You will be due for repeat colonoscopy next month, you may call  GI to schedule an appointment at [phone].   Consider pneumonia shot, shingles shots (through pharmacy).  Work on living will.  I will order CT of heart with calcium score to further evaluate for heart disease risk.  Good to see you today Return as needed or in 1 year for next wellness visit/physical

## 2024-02-20 NOTE — Assessment & Plan Note (Signed)
 Encouraged to continue working on advanced directive/living will

## 2024-02-20 NOTE — Assessment & Plan Note (Signed)
 Will be due for rpt colonoscopy later this year.

## 2024-02-20 NOTE — Assessment & Plan Note (Signed)
 Deteriorated in setting of several deaths in the family - continue nightly nortriptyline 

## 2024-02-20 NOTE — Assessment & Plan Note (Addendum)
 Chronic, stable on fish oil 2 gm daily.  Reviewed diet choices to improve trig and LDL control.  Previous Lpa normal.  Discussed ASCVD risk as well as CAC for further risk stratification - he agrees.  The 10-year ASCVD risk score (Arnett DK, et al., 2019) is: 15.4%   Values used to calculate the score:     Age: 63 years     Clincally relevant sex: Male     Is Non-Hispanic African American: No     Diabetic: No     Tobacco smoker: Yes     Systolic Blood Pressure: 118 mmHg     Is BP treated: No     HDL Cholesterol: 44.1 mg/dL     Total Cholesterol: 194 mg/dL

## 2024-02-20 NOTE — Assessment & Plan Note (Signed)
 Preventative protocols reviewed and updated unless pt declined. Discussed healthy diet and lifestyle.

## 2024-02-20 NOTE — Addendum Note (Signed)
 Addended by: RILLA BALLER on: 02/20/2024 11:39 AM   Modules accepted: Level of Service

## 2024-02-20 NOTE — Assessment & Plan Note (Signed)
 Continue to encourage smoking cessation. Planning on trying nicotine  pouches.

## 2024-02-20 NOTE — Progress Notes (Addendum)
 Ph: [phone] Fax: [phone]   Patient ID: Michael [name], male    DOB: Dec 06, 1960, 63 y.o.   MRN: [id number]  This visit was conducted in person.  BP 118/80   Pulse 93   Temp 97.8 F (36.6 C) (Oral)   Ht 5' 8 (1.727 m)   Wt 152 lb 4 oz (69.1 kg)   SpO2 94%   BMI 23.15 kg/m    CC: CPE Subjective:   HPI: Michael [name] is a 63 y.o. male presenting on 02/20/2024 for Annual Exam   Saw health advisor 01/2024 for medicare wellness visit. Note reviewed.  Tough year - lost father (HD), friend, and cousin  No results found.  Flowsheet Row Office Visit from 02/20/2024 in [facility] [place]  PHQ-2 Total Score 3       02/20/2024   11:01 AM 01/10/2024    2:52 PM 01/18/2023   12:58 PM 12/15/2022    3:31 PM 11/02/2022    2:08 PM  Fall Risk   Falls in the past year? 0 0 0 0 0  Number falls in past yr:  0     Injury with Fall?  0     Risk for fall due to : Other (Comment) No Fall Risks     Follow up Falls evaluation completed Falls evaluation completed        S/p ACDF 2018 [place]) with chronic residual pain - saw Dr Mindi s/p steroid injections. Received SS disability - started 12/08/2019. Now sees Dr Lani Dawley and pain management Bobbetta, Lauraine Gore) Q25mo with steroid injections. Takes oxycodone  and nortriptyline  at bedtime.    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 Sees pulm Tamea on Trelegy with PRN albuterol . Encouraged smoking cessation.  GERD on dexilant  60mg  daily.    Preventative: COLONOSCOPY 03/2021 - TA/HP, rpt 3 yrs Oma)  Prostate cancer screening - reassuring PSA yearly. No nocturia.  Lung cancer screening - undergoing, started 12/2019, latest 02/2024 Flu shot yearly COVID vaccine Pfizer 10/2019, 11/2019, no booster Tdap 2015 Pneumococcal vaccine - declines  RSV - to consider  Shingrix - discussed, to check with pharmacy  Advanced directive: discussed. Doesn't have advanced directive. Would want wife  Candis to be HCPOA. Advanced directive provided today.  Seat belt use discussed Sunscreen use discussed. No changing moles on skin. has seen dermatology Smoking - 3/4 ppd - encouraged full cessation, 40+ PY hx. Planning on trying nicotine  pouch.  Alcohol - none  Dentist yearly - due, needs lower work done - doesn't have dental insurance Eye exam yearly ([place])  Bowel - constipation managed with daily miralax - BM every 2-3 days. He is on medicines that contribute to constipation (TCA, opiate). 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 Interim medical history since our last visit reviewed. Allergies and medications reviewed and updated. Outpatient Medications Prior to Visit  Medication Sig Dispense Refill   albuterol  (VENTOLIN  HFA) 108 (90 Base) MCG/ACT inhaler Inhale 2 puffs into the lungs every 6 (six) hours as needed. 17 each 2   Bioflavonoid Products (ESTER C PO) Take by mouth daily.     cyanocobalamin 100 MCG tablet Take 100 mcg by mouth daily.      fish  oil-omega-3 fatty acids 1000 MG capsule Take 2 g by mouth daily.     guaiFENesin -codeine  (ROBITUSSIN AC) 100-10 MG/5ML syrup Take 5 mLs by mouth 3 (three) times daily as needed for cough (sedation precautions). 120 mL 0   HORIZANT 300 MG TBCR Take 1 tablet by mouth at bedtime.     Multiple Vitamin (MULTIVITAMIN) tablet Take 1 tablet by mouth daily.     oxyCODONE -acetaminophen (PERCOCET/ROXICET) 5-325 MG tablet Take 1 tablet by mouth at bedtime.     Probiotic Product (PROBIOTIC PO) Take by mouth.     tiZANidine (ZANAFLEX) 2 MG tablet Take 2 tablets by mouth at bedtime. Takes as needed     Zinc 30 MG TABS Take 1 tablet by mouth daily.     Cholecalciferol (VITAMIN D3 PO)  Take by mouth.     dexlansoprazole  (DEXILANT ) 60 MG capsule TAKE 1 CAPSULE BY MOUTH EVERY DAY 90 capsule 0   fluticasone  (FLONASE ) 50 MCG/ACT nasal spray SPRAY 2 SPRAYS INTO EACH NOSTRIL EVERY DAY 48 mL 0   nortriptyline  (PAMELOR ) 50 MG capsule TAKE 1 CAPSULE BY MOUTH EVERYDAY AT BEDTIME 90 capsule 0   nicotine  (NICOTROL ) 10 MG inhaler INHALE 1 CARTRIDGE (1 CONTINUOUS PUFFING TOTAL) INTO THE LUNGS AS NEEDED FOR SMOKING CESSATION. (Patient not taking: Reported on 02/20/2024) 168 each 0   TRELEGY ELLIPTA  100-62.5-25 MCG/ACT AEPB TAKE 1 PUFF BY MOUTH EVERY DAY (Patient not taking: Reported on 02/20/2024) 60 each 11   No facility-administered medications prior to visit.     Per HPI unless specifically indicated in ROS section below Review of Systems  Constitutional:  Negative for activity change, appetite change, chills, fatigue, fever and unexpected weight change.  HENT:  Negative for hearing loss.   Eyes:  Negative for visual disturbance.  Respiratory:  Negative for cough, chest tightness, shortness of breath and wheezing.   Cardiovascular:  Negative for chest pain, palpitations and leg swelling.  Gastrointestinal:  Negative for abdominal distention, abdominal pain, blood in stool, constipation, diarrhea, nausea and vomiting.  Genitourinary:  Negative for difficulty urinating and hematuria.  Musculoskeletal:  Negative for arthralgias, myalgias and neck pain.  Skin:  Negative for rash.  Neurological:  Negative for dizziness, seizures, syncope and headaches.  Hematological:  Negative for adenopathy. Bruises/bleeds easily.  Psychiatric/Behavioral:  Negative for dysphoric mood. The patient is not nervous/anxious.     Objective:  BP 118/80   Pulse 93   Temp 97.8 F (36.6 C) (Oral)   Ht 5' 8 (1.727 m)   Wt 152 lb 4 oz (69.1 kg)   SpO2 94%   BMI 23.15 kg/m   Wt Readings from Last 3 Encounters:  02/20/24 152 lb 4 oz (69.1 kg)  01/10/24 154 lb (69.9 kg)  09/21/23 154 lb 3.2 oz (69.9 kg)       Physical Exam Vitals and nursing note reviewed.  Constitutional:      General: He is not in acute distress.    Appearance: Normal appearance. He is well-developed. He is not ill-appearing.  HENT:     Head: Normocephalic and atraumatic.     Right Ear: Hearing, ear canal and external ear normal. There is impacted cerumen.     Left Ear: Hearing, ear canal and external ear normal. There is impacted cerumen.     Ears:     Comments: Cerumen covering both canals - requests R ear irrigation due to muffled hearing    Mouth/Throat:     Mouth: Mucous membranes are moist.  Pharynx: Oropharynx is clear. No oropharyngeal exudate or posterior oropharyngeal erythema.  Eyes:     General: No scleral icterus.    Extraocular Movements: Extraocular movements intact.     Conjunctiva/sclera: Conjunctivae normal.     Pupils: Pupils are equal, round, and reactive to light.  Neck:     Thyroid : No thyroid  mass or thyromegaly.     Vascular: No carotid bruit.  Cardiovascular:     Rate and Rhythm: Normal rate and regular rhythm.     Pulses: Normal pulses.          Radial pulses are 2+ on the right side and 2+ on the left side.     Heart sounds: Normal heart sounds. No murmur heard. Pulmonary:     Effort: Pulmonary effort is normal. No respiratory distress.     Breath sounds: Normal breath sounds. No wheezing, rhonchi or rales.  Abdominal:     General: Bowel sounds are normal. There is no distension.     Palpations: Abdomen is soft. There is no mass.     Tenderness: There is no abdominal tenderness. There is no guarding or rebound.     Hernia: No hernia is present.  Musculoskeletal:        General: Normal range of motion.     Cervical back: Normal range of motion and neck supple.     Right lower leg: No edema.     Left lower leg: No edema.  Lymphadenopathy:     Cervical: No cervical adenopathy.  Skin:    General: Skin is warm and dry.     Findings: No rash.  Neurological:     General: No focal  deficit present.     Mental Status: He is alert and oriented to person, place, and time.  Psychiatric:        Mood and Affect: Mood normal.        Behavior: Behavior normal.        Thought Content: Thought content normal.        Judgment: Judgment normal.       Results for orders placed or performed in visit on 02/13/24  VITAMIN D  25 Hydroxy (Vit-D Deficiency, Fractures)   Collection Time: 02/13/24  8:15 AM  Result Value Ref Range   VITD 44.09 30.00 - 100.00 ng/mL  PSA   Collection Time: 02/13/24  8:15 AM  Result Value Ref Range   PSA 0.37 0.10 - 4.00 ng/mL  Comprehensive metabolic panel with GFR   Collection Time: 02/13/24  8:15 AM  Result Value Ref Range   Sodium 139 135 - 145 mEq/L   Potassium 3.7 3.5 - 5.1 mEq/L   Chloride 103 96 - 112 mEq/L   CO2 28 19 - 32 mEq/L   Glucose, Bld 95 70 - 99 mg/dL   BUN 11 6 - 23 mg/dL   Creatinine, Ser 9.10 0.40 - 1.50 mg/dL   Total Bilirubin 0.5 0.2 - 1.2 mg/dL   Alkaline Phosphatase 119 (H) 39 - 117 U/L   AST 16 0 - 37 U/L   ALT 18 0 - 53 U/L   Total Protein 6.7 6.0 - 8.3 g/dL   Albumin 4.4 3.5 - 5.2 g/dL   GFR 09.68 >39.99 mL/min   Calcium 9.6 8.4 - 10.5 mg/dL  Lipid panel   Collection Time: 02/13/24  8:15 AM  Result Value Ref Range   Cholesterol 194 0 - 200 mg/dL   Triglycerides 777.9 (H) 0.0 - 149.0 mg/dL   HDL 55.89 >60.99  mg/dL   VLDL 55.5 (H) 0.0 - 59.9 mg/dL   LDL Cholesterol 893 (H) 0 - 99 mg/dL   Total CHOL/HDL Ratio 4    NonHDL 150.17     Assessment & Plan:   Problem List Items Addressed This Visit     Healthcare maintenance - Primary (Chronic)   Preventative protocols reviewed and updated unless pt declined. Discussed healthy diet and lifestyle.       Advanced directives, counseling/discussion (Chronic)   Encouraged to continue working on advanced directive/living will       TOBACCO ABUSE   Continue to encourage smoking cessation. Planning on trying nicotine  pouches.       Situational depression    Deteriorated in setting of several deaths in the family - continue nightly nortriptyline        Relevant Medications   nortriptyline  (PAMELOR ) 50 MG capsule   GERD   Chronic, stable on dexilant . H/o esophagitis, gastritis, duodenitis, HH      Relevant Medications   dexlansoprazole  (DEXILANT ) 60 MG capsule   History of colonic polyps   Will be due for rpt colonoscopy later this year.       Dyslipidemia   Chronic, stable on fish oil 2 gm daily.  Reviewed diet choices to improve trig and LDL control.  Previous Lpa normal.  Discussed ASCVD risk as well as CAC for further risk stratification - he agrees.  The 10-year ASCVD risk score (Arnett DK, et al., 2019) is: 15.4%   Values used to calculate the score:     Age: 35 years     Clincally relevant sex: Male     Is Non-Hispanic African American: No     Diabetic: No     Tobacco smoker: Yes     Systolic Blood Pressure: 118 mmHg     Is BP treated: No     HDL Cholesterol: 44.1 mg/dL     Total Cholesterol: 194 mg/dL       Relevant Orders   CT CARDIAC SCORING (SELF PAY ONLY)   Emphysema of lung (HCC)   Appreciate pulm care - continues Trelegy      Relevant Medications   fluticasone  (FLONASE ) 50 MCG/ACT nasal spray   Vitamin D  deficiency   Continues vit D 2000 units daily.         Meds ordered this encounter  Medications   dexlansoprazole  (DEXILANT ) 60 MG capsule    Sig: Take 1 capsule (60 mg total) by mouth daily.    Dispense:  90 capsule    Refill:  3   fluticasone  (FLONASE ) 50 MCG/ACT nasal spray    Sig: SPRAY 2 SPRAYS INTO EACH NOSTRIL EVERY DAY    Dispense:  48 mL    Refill:  3   nortriptyline  (PAMELOR ) 50 MG capsule    Sig: Take 1 capsule (50 mg total) by mouth at bedtime.    Dispense:  90 capsule    Refill:  3   Cholecalciferol (VITAMIN D3) 50 MCG (2000 UT) capsule    Sig: Take 1 capsule (2,000 Units total) by mouth daily.    Orders Placed This Encounter  Procedures   CT CARDIAC SCORING (SELF PAY ONLY)     Standing Status:   Future    Expiration Date:   02/19/2025    Preferred imaging location?:   [facility]    Patient Instructions  You will be due for repeat colonoscopy next month, you may call [facility] to schedule an appointment at [phone].   Consider  pneumonia shot, shingles shots (through pharmacy).  Work on living will.  I will order CT of heart with calcium score to further evaluate for heart disease risk.  Good to see you today Return as needed or in 1 year for next wellness visit/physical   Follow up plan: Return in about 1 year (around 02/19/2025) for medicare wellness visit, annual exam, prior fasting for blood work.  Anton Blas, MD

## 2024-02-20 NOTE — Assessment & Plan Note (Signed)
 Chronic, stable on dexilant . H/o esophagitis, gastritis, duodenitis, HH

## 2024-02-29 ENCOUNTER — Ambulatory Visit
Admission: RE | Admit: 2024-02-29 | Discharge: 2024-02-29 | Disposition: A | Discharge status: Home / Self Care | Payer: Self-pay | Source: Ambulatory Visit | Attending: Family Medicine | Admitting: Family Medicine

## 2024-02-29 ENCOUNTER — Ambulatory Visit
Admission: RE | Admit: 2024-02-29 | Discharge: 2024-02-29 | Disposition: A | Discharge status: Home / Self Care | Source: Ambulatory Visit | Attending: Acute Care | Admitting: Acute Care

## 2024-02-29 DIAGNOSIS — Z122 Encounter for screening for malignant neoplasm of respiratory organs: Secondary | ICD-10-CM | POA: Diagnosis present

## 2024-02-29 DIAGNOSIS — Z87891 Personal history of nicotine dependence: Secondary | ICD-10-CM | POA: Insufficient documentation

## 2024-02-29 DIAGNOSIS — F17210 Nicotine dependence, cigarettes, uncomplicated: Secondary | ICD-10-CM | POA: Insufficient documentation

## 2024-02-29 DIAGNOSIS — E785 Hyperlipidemia, unspecified: Secondary | ICD-10-CM | POA: Insufficient documentation

## 2024-03-01 ENCOUNTER — Encounter: Payer: Self-pay | Admitting: Gastroenterology

## 2024-03-01 ENCOUNTER — Ambulatory Visit: Payer: Self-pay | Admitting: Family Medicine

## 2024-03-01 DIAGNOSIS — E785 Hyperlipidemia, unspecified: Secondary | ICD-10-CM

## 2024-03-08 ENCOUNTER — Other Ambulatory Visit: Payer: Self-pay

## 2024-03-08 DIAGNOSIS — F17210 Nicotine dependence, cigarettes, uncomplicated: Secondary | ICD-10-CM

## 2024-03-08 DIAGNOSIS — Z122 Encounter for screening for malignant neoplasm of respiratory organs: Secondary | ICD-10-CM

## 2024-03-08 DIAGNOSIS — Z87891 Personal history of nicotine dependence: Secondary | ICD-10-CM

## 2024-03-09 NOTE — Telephone Encounter (Signed)
 Spoke with patient.

## 2024-03-20 ENCOUNTER — Ambulatory Visit: Admitting: Pulmonary Disease

## 2024-03-21 ENCOUNTER — Encounter: Payer: Self-pay | Admitting: Pulmonary Disease

## 2024-03-21 ENCOUNTER — Ambulatory Visit: Admitting: Pulmonary Disease

## 2024-03-21 VITALS — BP 126/90 | HR 92 | Temp 98.4°F | Ht 68.0 in | Wt 151.6 lb

## 2024-03-21 DIAGNOSIS — J4489 Other specified chronic obstructive pulmonary disease: Secondary | ICD-10-CM

## 2024-03-21 DIAGNOSIS — Z148 Genetic carrier of other disease: Secondary | ICD-10-CM | POA: Diagnosis not present

## 2024-03-21 DIAGNOSIS — F17210 Nicotine dependence, cigarettes, uncomplicated: Secondary | ICD-10-CM | POA: Diagnosis not present

## 2024-03-21 DIAGNOSIS — J432 Centrilobular emphysema: Secondary | ICD-10-CM | POA: Diagnosis not present

## 2024-03-21 MED ORDER — TRELEGY ELLIPTA 200-62.5-25 MCG/ACT IN AEPB: 1.0000 | INHALATION_SPRAY | Freq: Every day | RESPIRATORY_TRACT | 0 refills | Status: DC

## 2024-03-21 NOTE — Patient Instructions (Signed)
 VISIT SUMMARY:  You came in today for a follow-up visit regarding your COPD. We discussed your current symptoms, recent tests, and personal stressors.  YOUR PLAN:  -CHRONIC OBSTRUCTIVE PULMONARY DISEASE (COPD): COPD is a chronic lung condition that makes it hard to breathe. You are currently using Trelegy, which has provided some relief, but you are not using your emergency inhaler regularly. We discussed the potential benefit of using the emergency inhaler at night when your symptoms worsen. You will try a stronger dose of Trelegy (200 mcg) and should rinse your mouth well after using it. If the stronger Trelegy helps, please contact the clinic for a prescription adjustment. Additionally, I will send a note to Dr. KANDICE to clarify the need for repeat lung cancer screening tests.  INSTRUCTIONS:  Please use the emergency inhaler at night if your symptoms worsen. Try the stronger Trelegy (200 mcg) and rinse your mouth well after using it. Contact the clinic if the stronger Trelegy is effective for a prescription adjustment. I will follow up with Dr. KANDICE regarding your lung cancer screening results to clarify the need for repeat testing.

## 2024-03-21 NOTE — Progress Notes (Unsigned)
 Subjective:    Patient ID: Michael [name], male    DOB: 21-Jun-1961, 63 y.o.   MRN: [id number]  Patient Care Team: Rilla Baller, MD as PCP - General (Family Medicine) Tamea Dedra LITTIE, MD as Consulting Physician (Pulmonary Disease)  Chief Complaint  Patient presents with   Follow-up    Cough with brown phlegm. Shortness of breath on exertion.    BACKGROUND/INTERVAL:  HPI    Review of Systems A 10 point review of systems was performed and it is as noted above otherwise negative.   Patient Active Problem List   Diagnosis Date Noted   Medicare annual wellness visit, subsequent 01/18/2023   Vitamin D  deficiency 01/08/2023   Advanced directives, counseling/discussion 12/24/2020   Papilledema 04/02/2020   Pulmonary nodules 02/12/2020   Emphysema of lung (HCC) 02/12/2020   Constipation due to pain medication 10/16/2019   BPH (benign prostatic hyperplasia) 07/11/2018   Right inguinal hernia 07/11/2018   Cervical stenosis of spine 01/02/2018   HNP (herniated nucleus pulposus), cervical 09/30/2016   Dyslipidemia 02/29/2016   History of kidney stones 03/06/2014   Healthcare maintenance 01/28/2012   Situational depression 10/08/2009   GERD 05/23/2009   History of colonic polyps 05/23/2009   INSOMNIA 02/04/2009   TOBACCO ABUSE 05/01/2007    Social History   Tobacco Use   Smoking status: Every Day    Current packs/day: 1.00    Average packs/day: 1 pack/day for 40.0 years (40.0 ttl pk-yrs)    Types: Cigarettes   Smokeless tobacco: Never   Tobacco comments:    1 PPD- khj 09/21/2023  Substance Use Topics   Alcohol use: No    No Known Allergies  Current Meds  Medication Sig   albuterol  (VENTOLIN  HFA) 108 (90 Base) MCG/ACT inhaler Inhale 2 puffs into the lungs every 6 (six) hours as needed.   Bioflavonoid Products (ESTER C PO) Take by mouth daily.   Cholecalciferol (VITAMIN D3) 50 MCG (2000 UT) capsule Take 1 capsule (2,000 Units total) by mouth daily.    cyanocobalamin 100 MCG tablet Take 100 mcg by mouth daily.    dexlansoprazole  (DEXILANT ) 60 MG capsule Take 1 capsule (60 mg total) by mouth daily.   fish oil-omega-3 fatty acids 1000 MG capsule Take 2 g by mouth daily.   fluticasone  (FLONASE ) 50 MCG/ACT nasal spray SPRAY 2 SPRAYS INTO EACH NOSTRIL EVERY DAY   guaiFENesin -codeine  (ROBITUSSIN AC) 100-10 MG/5ML syrup Take 5 mLs by mouth 3 (three) times daily as needed for cough (sedation precautions).   HORIZANT 300 MG TBCR Take 1 tablet by mouth at bedtime.   Multiple Vitamin (MULTIVITAMIN) tablet Take 1 tablet by mouth daily.   nortriptyline  (PAMELOR ) 50 MG capsule Take 1 capsule (50 mg total) by mouth at bedtime.   oxyCODONE -acetaminophen (PERCOCET/ROXICET) 5-325 MG tablet Take 1 tablet by mouth at bedtime.   Probiotic Product (PROBIOTIC PO) Take by mouth.   tiZANidine (ZANAFLEX) 2 MG tablet Take 2 tablets by mouth at bedtime. Takes as needed   TRELEGY ELLIPTA  100-62.5-25 MCG/ACT AEPB TAKE 1 PUFF BY MOUTH EVERY DAY   Zinc 30 MG TABS Take 1 tablet by mouth daily.    Immunization History  Administered Date(s) Administered   Influenza,inj,Quad PF,6+ Mos 07/11/2018   Influenza-Unspecified 05/09/2014, 05/26/2016, 05/16/2017   PFIZER(Purple Top)SARS-COV-2 Vaccination 11/02/2019, 11/27/2019   Td 08/10/2002   Tdap 07/25/2014        Objective:     BP (!) 126/90 (BP Location: Left Arm, Patient Position: Sitting, Cuff Size: Normal)  Pulse 92   Temp 98.4 F (36.9 C) (Oral)   Ht 5' 8 (1.727 m)   Wt 151 lb 9.6 oz (68.8 kg)   SpO2 97%   BMI 23.05 kg/m   SpO2: 97 %  GENERAL: HEAD: Normocephalic, atraumatic.  EYES: Pupils equal, round, reactive to light.  No scleral icterus.  MOUTH:  NECK: Supple. No thyromegaly. Trachea midline. No JVD.  No adenopathy. PULMONARY: Good air entry bilaterally.  No adventitious sounds. CARDIOVASCULAR: S1 and S2. Regular rate and rhythm.  ABDOMEN: MUSCULOSKELETAL: No joint deformity, no clubbing, no  edema.  NEUROLOGIC:  SKIN: Intact,warm,dry. PSYCH:        Assessment & Plan:   No diagnosis found.  No orders of the defined types were placed in this encounter.   No orders of the defined types were placed in this encounter.      Advised if symptoms do not improve or worsen, to please contact office for sooner follow up or seek emergency care.    I spent xxx minutes of dedicated to the care of this patient on the date of this encounter to include pre-visit review of records, face-to-face time with the patient discussing conditions above, post visit ordering of testing, clinical documentation with the electronic health record, making appropriate referrals as documented, and communicating necessary findings to members of the patients care team.     C. Leita Sanders, MD Advanced Bronchoscopy PCCM  [facility]    *This note was generated using voice recognition software/Dragon and/or AI transcription program.  Despite best efforts to proofread, errors can occur which can change the meaning. Any transcriptional errors that result from this process are unintentional and may not be fully corrected at the time of dictation.

## 2024-03-30 ENCOUNTER — Encounter: Payer: Self-pay | Admitting: Pulmonary Disease

## 2024-03-30 ENCOUNTER — Ambulatory Visit: Admitting: *Deleted

## 2024-03-30 VITALS — Ht 68.0 in | Wt 152.0 lb

## 2024-03-30 DIAGNOSIS — Z860100 Personal history of colon polyps, unspecified: Secondary | ICD-10-CM

## 2024-03-30 MED ORDER — PEG 3350-KCL-NA BICARB-NACL 420 G PO SOLR: 4000.0000 mL | Freq: Once | ORAL | 0 refills | Status: AC

## 2024-03-30 NOTE — Progress Notes (Signed)
  Pre visit completed over telephone with patient and his wife.  Instructions forwarded through MyChart and mailed to home address.  No egg or soy allergy known to patient  No issues known to pt with past sedation with any surgeries or procedures Patient denies ever being told they had issues or difficulty with intubation  No FH of Malignant Hyperthermia Pt is not on diet pills Pt is not on  home 02  Pt is not on blood thinners  Pt denies issues with constipation - YES 2 day prep prescribed No A fib or A flutter Have any cardiac testing pending-- NO Pt instructed to use Single[employer] or GoodRx for a price reduction on prep  - patient opted for [facility]  Chart reviewed by DOROTHA Schillings, CRNA

## 2024-04-16 ENCOUNTER — Encounter: Payer: Self-pay | Admitting: Pulmonary Disease

## 2024-04-16 DIAGNOSIS — J4489 Other specified chronic obstructive pulmonary disease: Secondary | ICD-10-CM

## 2024-04-16 MED ORDER — TRELEGY ELLIPTA 200-62.5-25 MCG/ACT IN AEPB: 1.0000 | INHALATION_SPRAY | Freq: Every day | RESPIRATORY_TRACT | 11 refills | Status: DC

## 2024-04-17 ENCOUNTER — Other Ambulatory Visit: Payer: Self-pay

## 2024-04-17 DIAGNOSIS — J4489 Other specified chronic obstructive pulmonary disease: Secondary | ICD-10-CM

## 2024-04-17 MED ORDER — TRELEGY ELLIPTA 200-62.5-25 MCG/ACT IN AEPB: 1.0000 | INHALATION_SPRAY | Freq: Every day | RESPIRATORY_TRACT | 11 refills | Status: DC

## 2024-04-19 ENCOUNTER — Encounter: Admitting: Gastroenterology

## 2024-05-08 ENCOUNTER — Encounter: Payer: Self-pay | Admitting: Family Medicine

## 2024-05-08 ENCOUNTER — Ambulatory Visit: Admitting: Family Medicine

## 2024-05-08 VITALS — BP 112/68 | HR 99 | Temp 98.7°F | Ht 68.0 in | Wt 153.0 lb

## 2024-05-08 DIAGNOSIS — R051 Acute cough: Secondary | ICD-10-CM | POA: Diagnosis not present

## 2024-05-08 DIAGNOSIS — J019 Acute sinusitis, unspecified: Secondary | ICD-10-CM | POA: Diagnosis not present

## 2024-05-08 DIAGNOSIS — H6191 Disorder of right external ear, unspecified: Secondary | ICD-10-CM | POA: Insufficient documentation

## 2024-05-08 LAB — POC COVID19 BINAXNOW: SARS Coronavirus 2 Ag: NEGATIVE

## 2024-05-08 MED ORDER — MUPIROCIN CALCIUM 2 % EX CREA: 1.0000 | TOPICAL_CREAM | Freq: Two times a day (BID) | CUTANEOUS | 0 refills | Status: AC

## 2024-05-08 MED ORDER — AMOXICILLIN-POT CLAVULANATE 875-125 MG PO TABS: 1.0000 | ORAL_TABLET | Freq: Two times a day (BID) | ORAL | 0 refills | Status: AC

## 2024-05-08 NOTE — Patient Instructions (Addendum)
 For ear sore - may use mupirocin cream sent to pharmacy. If this doesn't help wound heal, let me know for referral to dermatology to rule out small skin cancer of ear.  For sinus symptoms - take augmentin  antibiotic to cover sinus infection. May use flonase  as needed for nasal congestion/inflammation. Push fluids and rest.

## 2024-05-08 NOTE — Assessment & Plan Note (Addendum)
 Anticipate bacterial sinusitis given duration and progression of symptoms COVID swab today negative Rx augmentin  antibiotic. Continue flonase  use. Update if not improving with treatment. Pt agrees with plan.

## 2024-05-08 NOTE — Assessment & Plan Note (Signed)
 Poorly healing erosion to right external ear - Rx mupirocin topically. If not healing well, to let me know for dermatology evaluation.

## 2024-05-08 NOTE — Progress Notes (Signed)
 Ph: [phone] Fax: [phone]   Patient ID: Michael [name], male    DOB: Aug 28, 1960, 63 y.o.   MRN: [id number]  This visit was conducted in person.  BP 112/68   Pulse 99   Temp 98.7 F (37.1 C) (Oral)   Ht 5' 8 (1.727 m)   Wt 153 lb (69.4 kg)   SpO2 97%   BMI 23.26 kg/m    CC: R ear pain  Subjective:   HPI: Michael [name] is a 63 y.o. male presenting on 05/08/2024 for Ear Pain (Right ear has sore on it that is tender and some drainage. )   6d h/o sinus congestion with radiation to R ear. Worse pain with laying on right ear -throbbing. Initial fevers/chills, now resolved. Decreased appetite. Initial ST, ongoing chronic cough acutely worse productive of white mucous. +PNdrainage. + increased fatigue.   No nausea, abd pain, diarrhea, no ear drainage.   Hasn't tried anything for this yet.  He's been using afrin for a few days.   Ongoing smoker, known COPD.   Spot to external right ear present for the past 3 months, has started bleeding more recently.       Relevant past medical, surgical, family and social history reviewed and updated as indicated. Interim medical history since our last visit reviewed. Allergies and medications reviewed and updated. Outpatient Medications Prior to Visit  Medication Sig Dispense Refill   albuterol  (VENTOLIN  HFA) 108 (90 Base) MCG/ACT inhaler Inhale 2 puffs into the lungs every 6 (six) hours as needed. 17 each 2   Bioflavonoid Products (ESTER C PO) Take by mouth daily.     Cholecalciferol (VITAMIN D3) 50 MCG (2000 UT) capsule Take 1 capsule (2,000 Units total) by mouth daily.     cyanocobalamin 100 MCG tablet Take 100 mcg by mouth daily.      dexlansoprazole  (DEXILANT ) 60 MG capsule Take 1 capsule (60 mg total) by mouth daily. 90 capsule 3   fish oil-omega-3 fatty acids 1000 MG capsule Take 2 g by mouth daily.     fluticasone  (FLONASE ) 50 MCG/ACT nasal spray SPRAY 2 SPRAYS INTO EACH NOSTRIL EVERY DAY 48 mL 3    Fluticasone -Umeclidin-Vilant (TRELEGY ELLIPTA ) 200-62.5-25 MCG/ACT AEPB Inhale 1 puff into the lungs daily in the afternoon. 60 each 11   HORIZANT 300 MG TBCR Take 1 tablet by mouth at bedtime.     Multiple Vitamin (MULTIVITAMIN) tablet Take 1 tablet by mouth daily.     nortriptyline  (PAMELOR ) 50 MG capsule Take 1 capsule (50 mg total) by mouth at bedtime. 90 capsule 3   oxyCODONE -acetaminophen (PERCOCET/ROXICET) 5-325 MG tablet Take 1 tablet by mouth at bedtime.     Probiotic Product (PROBIOTIC PO) Take by mouth.     tiZANidine (ZANAFLEX) 2 MG tablet Take 2 tablets by mouth at bedtime. Takes as needed     Zinc 30 MG TABS Take 1 tablet by mouth daily.     guaiFENesin -codeine  (ROBITUSSIN AC) 100-10 MG/5ML syrup Take 5 mLs by mouth 3 (three) times daily as needed for cough (sedation precautions). (Patient not taking: Reported on 05/08/2024) 120 mL 0   nicotine  (NICOTROL ) 10 MG inhaler INHALE 1 CARTRIDGE (1 CONTINUOUS PUFFING TOTAL) INTO THE LUNGS AS NEEDED FOR SMOKING CESSATION. (Patient not taking: Reported on 03/30/2024) 168 each 0   Polyethylene Glycol 3350  (MIRALAX PO) Take 117 g by mouth once.     No facility-administered medications prior to visit.     Per HPI unless specifically indicated in ROS  section below Review of Systems  Objective:  BP 112/68   Pulse 99   Temp 98.7 F (37.1 C) (Oral)   Ht 5' 8 (1.727 m)   Wt 153 lb (69.4 kg)   SpO2 97%   BMI 23.26 kg/m   Wt Readings from Last 3 Encounters:  05/08/24 153 lb (69.4 kg)  03/30/24 152 lb (68.9 kg)  03/21/24 151 lb 9.6 oz (68.8 kg)      Physical Exam Vitals and nursing note reviewed.  Constitutional:      Appearance: Normal appearance. He is not ill-appearing.  HENT:     Head: Normocephalic and atraumatic.     Right Ear: Hearing, tympanic membrane, ear canal and external ear normal. There is no impacted cerumen.     Left Ear: Hearing, tympanic membrane, ear canal and external ear normal. There is no impacted cerumen.      Ears:      Comments: R cavum of concha with poorly healing erosion    Nose: Congestion present. No rhinorrhea.     Right Sinus: No maxillary sinus tenderness or frontal sinus tenderness.     Left Sinus: No maxillary sinus tenderness or frontal sinus tenderness.     Comments: Wearing mask    Mouth/Throat:     Mouth: Mucous membranes are moist.     Pharynx: Oropharynx is clear. Posterior oropharyngeal erythema present. No oropharyngeal exudate.     Tonsils: No tonsillar exudate.  Eyes:     Extraocular Movements: Extraocular movements intact.     Conjunctiva/sclera: Conjunctivae normal.     Pupils: Pupils are equal, round, and reactive to light.  Cardiovascular:     Rate and Rhythm: Regular rhythm. Tachycardia present.     Pulses: Normal pulses.     Heart sounds: Normal heart sounds. No murmur heard. Pulmonary:     Effort: Pulmonary effort is normal. No respiratory distress.     Breath sounds: No wheezing, rhonchi or rales.     Comments:  RLL crackles Musculoskeletal:     Cervical back: Normal range of motion and neck supple. No rigidity.     Right lower leg: No edema.     Left lower leg: No edema.  Lymphadenopathy:     Cervical: No cervical adenopathy.  Skin:    General: Skin is warm and dry.     Findings: No rash.  Neurological:     Mental Status: He is alert.  Psychiatric:        Mood and Affect: Mood normal.        Behavior: Behavior normal.       Results for orders placed or performed in visit on 05/08/24  POC COVID-19 BinaxNow   Collection Time: 05/08/24  2:43 PM  Result Value Ref Range   SARS Coronavirus 2 Ag Negative Negative    Assessment & Plan:   Problem List Items Addressed This Visit     Acute non-recurrent sinusitis - Primary   Anticipate bacterial sinusitis given duration and progression of symptoms COVID swab today negative Rx augmentin  antibiotic. Continue flonase  use. Update if not improving with treatment. Pt agrees with plan.       Relevant  Medications   amoxicillin -clavulanate (AUGMENTIN ) 875-125 MG tablet   Skin lesion of right external ear   Poorly healing erosion to right external ear - Rx mupirocin topically. If not healing well, to let me know for dermatology evaluation.       Other Visit Diagnoses       Acute cough  Relevant Orders   POC COVID-19 BinaxNow (Completed)        Meds ordered this encounter  Medications   mupirocin cream (BACTROBAN) 2 %    Sig: Apply 1 Application topically 2 (two) times daily. Formulate cream/ointment based on cost/insurance coverage    Dispense:  30 g    Refill:  0   amoxicillin -clavulanate (AUGMENTIN ) 875-125 MG tablet    Sig: Take 1 tablet by mouth 2 (two) times daily for 10 days.    Dispense:  20 tablet    Refill:  0    Orders Placed This Encounter  Procedures   POC COVID-19 BinaxNow    Patient Instructions  For ear sore - may use mupirocin cream sent to pharmacy. If this doesn't help wound heal, let me know for referral to dermatology to rule out small skin cancer of ear.  For sinus symptoms - take augmentin  antibiotic to cover sinus infection. May use flonase  as needed for nasal congestion/inflammation. Push fluids and rest.   Follow up plan: Return if symptoms worsen or fail to improve.  Anton Blas, MD

## 2024-05-24 ENCOUNTER — Encounter: Payer: Self-pay | Admitting: Gastroenterology

## 2024-05-29 ENCOUNTER — Encounter: Admitting: Gastroenterology

## 2024-06-05 ENCOUNTER — Other Ambulatory Visit: Payer: Self-pay | Admitting: Pulmonary Disease

## 2024-06-05 DIAGNOSIS — J4489 Other specified chronic obstructive pulmonary disease: Secondary | ICD-10-CM

## 2024-06-05 NOTE — Telephone Encounter (Signed)
 Script has been sent in and I have notified the patient.  Nothing further needed.

## 2024-06-05 NOTE — Telephone Encounter (Signed)
 Copied from CRM [id number]. Topic: Clinical - Prescription Issue >> Jun 05, 2024  3:49 PM Isabell A wrote: Reason for CRM:  Pharmacy made a mistake & discontinued medication - requesting to resend Trelegy 200.  [id number] [address], [place] - [address] [place] [address] Phone: [phone]  Fax: [phone]

## 2024-06-29 ENCOUNTER — Ambulatory Visit (AMBULATORY_SURGERY_CENTER)

## 2024-06-29 VITALS — Ht 68.0 in | Wt 154.0 lb

## 2024-06-29 DIAGNOSIS — Z860100 Personal history of colon polyps, unspecified: Secondary | ICD-10-CM

## 2024-06-29 NOTE — Progress Notes (Signed)
 No egg or soy allergy known to patient  No issues known to pt with past sedation with any surgeries or procedures Patient denies ever being told they had issues or difficulty with intubation  No FH of Malignant Hyperthermia Pt is not on diet pills Pt is not on  home 02  Pt is not on blood thinners  Pt has intermittent issues with constipation, extra miralax with prep instructed   No A fib or A flutter Have any cardiac testing pending--No Pt can ambulate  Pt denies use of chewing tobacco Discussed diabetic I weight loss medication holds Discussed NSAID holds Checked BMI Pt instructed to use Single[employer] or GoodRx for a price reduction on prep  Patient's chart reviewed by Norleen Schillings CNRA prior to previsit and patient appropriate for the [facility].  Pre visit completed and red dot placed by patient's name on their procedure day (on provider's schedule).

## 2024-07-03 ENCOUNTER — Encounter: Payer: Self-pay | Admitting: Gastroenterology

## 2024-07-04 ENCOUNTER — Ambulatory Visit: Admitting: Pulmonary Disease

## 2024-07-09 ENCOUNTER — Telehealth: Payer: Self-pay | Admitting: Gastroenterology

## 2024-07-09 NOTE — Telephone Encounter (Signed)
 Thanks for letting me know. Given illness in the family, do not need to process late cancellation fee

## 2024-07-09 NOTE — Telephone Encounter (Signed)
 Good Morning Dr. Leigh,   I received a call from this patient stating that his wife has been in the hospital for 2 days and is unsure when she would be release. Patient is requesting to reschedule his procedure. Patient was schedule to have a colonoscopy on December the 2nd. Patient has been reschedule for a January the 16 th. Please advise.    Thank you

## 2024-07-10 ENCOUNTER — Encounter: Admitting: Gastroenterology

## 2024-07-16 ENCOUNTER — Encounter: Payer: Self-pay | Admitting: Pulmonary Disease

## 2024-07-16 ENCOUNTER — Ambulatory Visit: Admitting: Pulmonary Disease

## 2024-07-16 VITALS — BP 122/78 | HR 105 | Temp 97.7°F | Ht 68.0 in | Wt 156.0 lb

## 2024-07-16 DIAGNOSIS — R0602 Shortness of breath: Secondary | ICD-10-CM | POA: Diagnosis not present

## 2024-07-16 DIAGNOSIS — J432 Centrilobular emphysema: Secondary | ICD-10-CM

## 2024-07-16 DIAGNOSIS — J449 Chronic obstructive pulmonary disease, unspecified: Secondary | ICD-10-CM | POA: Diagnosis not present

## 2024-07-16 DIAGNOSIS — F17210 Nicotine dependence, cigarettes, uncomplicated: Secondary | ICD-10-CM | POA: Diagnosis not present

## 2024-07-16 DIAGNOSIS — J4489 Other specified chronic obstructive pulmonary disease: Secondary | ICD-10-CM

## 2024-07-16 DIAGNOSIS — Z148 Genetic carrier of other disease: Secondary | ICD-10-CM

## 2024-07-16 NOTE — Progress Notes (Unsigned)
 Subjective:    Patient ID: Michael [name], male    DOB: Jan 12, 1961, 63 y.o.   MRN: [id number]  Patient Care Team: Rilla Baller, MD as PCP - General (Family Medicine) Tamea Dedra LITTIE, MD as Consulting Physician (Pulmonary Disease)  Chief Complaint  Patient presents with  . COPD    Cough with brown phlegm. Wheezing and shortness of breath on exertion. Using Trelegy 200 mg daily. Using Ventolin  at least 2x a week.     BACKGROUND/INTERVAL:  HPI   Review of Systems A 10 point review of systems was performed and it is as noted above otherwise negative.   Patient Active Problem List   Diagnosis Date Noted  . Skin lesion of right external ear 05/08/2024  . Medicare annual wellness visit, subsequent 01/18/2023  . Vitamin D  deficiency 01/08/2023  . Advanced directives, counseling/discussion 12/24/2020  . 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 Pulmonary nodules 02/12/2020  . Emphysema of lung (HCC) 02/12/2020  . Constipation due to pain medication 10/16/2019  . BPH (benign prostatic hyperplasia) 07/11/2018  . Right inguinal hernia 07/11/2018  . Cervical stenosis of spine 01/02/2018  . HNP (herniated nucleus pulposus), cervical 09/30/2016  . Dyslipidemia 02/29/2016  . History of kidney stones 03/06/2014  . Acute non-recurrent sinusitis 09/01/2012  . Healthcare maintenance 01/28/2012  . Situational depression 10/08/2009  . GERD 05/23/2009  . History of colonic polyps 05/23/2009  . INSOMNIA 02/04/2009  . TOBACCO ABUSE 05/01/2007    Social History   Tobacco Use  . Smoking status: Every Day    Current packs/day: 1.00    Average packs/day: 1 pack/day for 40.0 years (40.0 ttl pk-yrs)    Types: Cigarettes  . Smokeless tobacco: Never  . Tobacco comments:    1 PPD- khj 09/21/2023  Substance Use Topics  . Alcohol use: No    No Known Allergies  Current Meds  Medication Sig  . albuterol  (VENTOLIN  HFA) 108 (90 Base) MCG/ACT inhaler Inhale 2 puffs into the lungs every 6 (six)  hours as needed.  SABRA Bioflavonoid Products (ESTER C PO) Take by mouth daily.  . Cholecalciferol (VITAMIN D3) 50 MCG (2000 UT) capsule Take 1 capsule (2,000 Units total) by mouth daily.  . cyanocobalamin 100 MCG tablet Take 100 mcg by mouth daily.   . dexlansoprazole  (DEXILANT ) 60 MG capsule Take 1 capsule (60 mg total) by mouth daily.  . fish oil-omega-3 fatty acids 1000 MG capsule Take 2 g by mouth daily.  . fluticasone  (FLONASE ) 50 MCG/ACT nasal spray SPRAY 2 SPRAYS INTO EACH NOSTRIL EVERY DAY  . HORIZANT 300 MG TBCR Take 1 tablet by mouth at bedtime.  . Multiple Vitamin (MULTIVITAMIN) tablet Take 1 tablet by mouth daily.  . mupirocin  cream (BACTROBAN ) 2 % Apply 1 Application topically 2 (two) times daily. Formulate cream/ointment based on cost/insurance coverage  . nortriptyline  (PAMELOR ) 50 MG capsule Take 1 capsule (50 mg total) by mouth at bedtime.  . oxyCODONE -acetaminophen (PERCOCET/ROXICET) 5-325 MG tablet Take 1 tablet by mouth at bedtime.  . Probiotic Product (PROBIOTIC PO) Take by mouth.  SABRA tiZANidine (ZANAFLEX) 2 MG tablet Take 2 tablets by mouth at bedtime. Takes as needed  . TRELEGY ELLIPTA  200-62.5-25 MCG/ACT AEPB INHALE 1 PUFF INTO THE LUNGS DAILY IN THE AFTERNOON.  SABRA Zinc 30 MG TABS Take 1 tablet by mouth daily.    Immunization History  Administered Date(s) Administered  . Influenza,inj,Quad PF,6+ Mos 07/11/2018  . Influenza-Unspecified 05/09/2014, 05/26/2016, 05/16/2017  . PFIZER(Purple Top)SARS-COV-2 Vaccination 11/02/2019, 11/27/2019  . Td 08/10/2002  .  Tdap 07/25/2014        Objective:     Vitals:   07/16/24 1031  BP: 122/78  Pulse: (!) 108  Temp: 97.7 F (36.5 C)  Height: 5' 8 (1.727 m)  Weight: 156 lb (70.8 kg)  SpO2: 97%  TempSrc: Temporal  BMI (Calculated): 23.73     SpO2: 97 %  GENERAL: HEAD: Normocephalic, atraumatic.  EYES: Pupils equal, round, reactive to light.  No scleral icterus.  MOUTH:  NECK: Supple. No thyromegaly. Trachea  midline. No JVD.  No adenopathy. PULMONARY: Good air entry bilaterally.  No adventitious sounds. CARDIOVASCULAR: S1 and S2. Regular rate and rhythm.  ABDOMEN: MUSCULOSKELETAL: No joint deformity, no clubbing, no edema.  NEUROLOGIC:  SKIN: Intact,warm,dry. PSYCH:        Assessment & Plan:   No diagnosis found.  No orders of the defined types were placed in this encounter.   No orders of the defined types were placed in this encounter.     Advised if symptoms do not improve or worsen, to please contact office for sooner follow up or seek emergency care.    I spent xxx minutes of dedicated to the care of this patient on the date of this encounter to include pre-visit review of records, face-to-face time with the patient discussing conditions above, post visit ordering of testing, clinical documentation with the electronic health record, making appropriate referrals as documented, and communicating necessary findings to members of the patients care team.     C. Leita Sanders, MD Advanced Bronchoscopy PCCM [facility]    *This note was generated using voice recognition software/Dragon and/or AI transcription program.  Despite best efforts to proofread, errors can occur which can change the meaning. Any transcriptional errors that result from this process are unintentional and may not be fully corrected at the time of dictation.

## 2024-07-16 NOTE — Patient Instructions (Signed)
 VISIT SUMMARY:  You came in today because of a cough and shortness of breath. You mentioned that your cough produces white sputum, especially when walking long distances, and you have been using Trelegy and albuterol  to manage your symptoms. You also shared your concerns about your smoking habit and your history of heart issues. We discussed your upcoming colonoscopy and your concerns about the progression of your symptoms.  YOUR PLAN:  -CHRONIC OBSTRUCTIVE PULMONARY DISEASE (COPD): COPD is a chronic lung disease that makes it hard to breathe. We increased your dose of Trelegy, which has been somewhat helpful, and you are using albuterol  as needed. We discussed the possibility of starting a new nebulizer medication to reduce lung inflammation and the benefits of pulmonary rehabilitation. We have ordered pulmonary function tests and an echocardiogram to assess your pulmonary artery pressure.  -NICOTINE  DEPENDENCE, CIGARETTES: Nicotine  dependence means you are addicted to the nicotine  in cigarettes. You are currently smoking three-quarters of a pack per day and have had difficulty quitting due to personal stressors. We discussed your plans to reduce smoking as part of your New Year's resolution and encouraged you to continue with these efforts.  -SHORTNESS OF BREATH: Shortness of breath means you have difficulty breathing, especially during physical activities like walking long distances or lifting. Your oxygen levels are adequate during ambulation. We have ordered an echocardiogram to check for pulmonary hypertension and referred you to pulmonary rehabilitation.  INSTRUCTIONS:  Please follow up with the pulmonary function tests and echocardiogram as ordered. Attend the pulmonary rehabilitation sessions as referred. Continue to work on reducing your smoking, and let us  know if you need additional support or resources.

## 2024-07-17 ENCOUNTER — Encounter: Payer: Self-pay | Admitting: Pulmonary Disease

## 2024-07-27 ENCOUNTER — Encounter: Attending: Pulmonary Disease | Admitting: *Deleted

## 2024-07-27 DIAGNOSIS — J4489 Other specified chronic obstructive pulmonary disease: Secondary | ICD-10-CM

## 2024-07-27 NOTE — Progress Notes (Signed)
 Initial phone call completed. Diagnosis can be found in [facility] 12/8 . EP Orientation scheduled for Monday 12/29 at 1:45.  Michael [name] is a current tobacco user. Intervention for tobacco cessation was provided at the initial medical review. He was asked about readiness to quit and reported that he is thinking about quitting and interested in the education provided during the program. Patient was advised and educated about tobacco cessation using combination therapy, tobacco cessation classes, quit line, and quit smoking apps. Patient demonstrated understanding of this material. Staff will continue to provide encouragement and follow up with the patient throughout the program.

## 2024-07-31 ENCOUNTER — Ambulatory Visit: Payer: Self-pay | Admitting: Pulmonary Disease

## 2024-07-31 ENCOUNTER — Ambulatory Visit
Admission: RE | Admit: 2024-07-31 | Discharge: 2024-07-31 | Disposition: A | Discharge status: Home / Self Care | Source: Ambulatory Visit | Attending: Pulmonary Disease | Admitting: Pulmonary Disease

## 2024-07-31 DIAGNOSIS — R0602 Shortness of breath: Secondary | ICD-10-CM | POA: Insufficient documentation

## 2024-07-31 DIAGNOSIS — R0600 Dyspnea, unspecified: Secondary | ICD-10-CM | POA: Insufficient documentation

## 2024-07-31 DIAGNOSIS — F17200 Nicotine dependence, unspecified, uncomplicated: Secondary | ICD-10-CM | POA: Diagnosis not present

## 2024-07-31 DIAGNOSIS — J449 Chronic obstructive pulmonary disease, unspecified: Secondary | ICD-10-CM | POA: Insufficient documentation

## 2024-07-31 DIAGNOSIS — E785 Hyperlipidemia, unspecified: Secondary | ICD-10-CM | POA: Diagnosis not present

## 2024-07-31 LAB — ECHOCARDIOGRAM COMPLETE
AR max vel: 2.95 cm2
AV Area VTI: 3.24 cm2
AV Area mean vel: 2.87 cm2
AV Mean grad: 2.5 mmHg
AV Peak grad: 4.5 mmHg
Ao pk vel: 1.06 m/s
Area-P 1/2: 4.36 cm2
MV VTI: 4.34 cm2
S' Lateral: 3.6 cm

## 2024-07-31 NOTE — Progress Notes (Signed)
*  PRELIMINARY RESULTS* Echocardiogram 2D Echocardiogram has been performed.  Floydene Harder 07/31/2024, 10:28 AM

## 2024-08-06 ENCOUNTER — Encounter

## 2024-08-15 ENCOUNTER — Encounter: Attending: Pulmonary Disease

## 2024-08-15 VITALS — Ht 69.3 in | Wt 153.6 lb

## 2024-08-15 DIAGNOSIS — J4489 Other specified chronic obstructive pulmonary disease: Secondary | ICD-10-CM | POA: Diagnosis present

## 2024-08-15 NOTE — Progress Notes (Signed)
 Pulmonary Individual Treatment Plan  Patient Details  Name: Michael [name] MRN: [id number] Date of Birth: 11/26/1960 Referring Provider:   Flowsheet Row Pulmonary Rehab from 08/15/2024 in [facility] Cardiac and Pulmonary Rehab  Referring Provider Dr. Dedra Sanders    Initial Encounter Date:  Flowsheet Row Pulmonary Rehab from 08/15/2024 in [facility] Cardiac and Pulmonary Rehab  Date 08/15/24    Visit Diagnosis: COPD with asthma [facility])  Patient's Home Medications on Admission: Current Medications[1]  Past Medical History: Past Medical History:  Diagnosis Date   Adenomatous colon polyp 02/2008   AK (actinic keratosis)    Allergy    Basal cell carcinoma of face    Mohs procedure   COPD (chronic obstructive pulmonary disease) (HCC)    COVID-19 virus infection 03/15/2022   COVID-19 virus infection 03/15/2022   Dyslipidemia 02/29/2016   Emphysema lung (HCC)    Esophagitis 02/13/2001   Gastritis 02/13/2001   GERD (gastroesophageal reflux disease)    Hemorrhoids    Hiatal hernia    HNP (herniated nucleus pulposus), cervical 09/30/2016   With radiculopathy, s/p anterior discectomy [facility]) 12/2016   Internal hemorrhoids    Reactive depression (situational)    Renal stones    Smoker     Tobacco Use: Tobacco Use History[2]  Labs: Review Flowsheet  More data exists      Latest Ref Rng & Units 12/17/2020 12/22/2021 02/26/2022 01/10/2023 02/13/2024  Labs for ITP Cardiac and Pulmonary Rehab  Cholestrol 0 - 200 mg/dL 818  799  809  819  805   LDL (calc) 0 - 99 mg/dL 896  - - 99  893   Direct LDL mg/dL - 886.9  889.9  - -  HDL-C >39.00 mg/dL 59.09  52.99  59.29  56.69  44.10   Trlycerides 0.0 - 149.0 mg/dL 814.9  715.9  742.9  812.9  222.0      Pulmonary Assessment Scores:  Pulmonary Assessment Scores     Row Name 08/15/24 1517         ADL UCSD   ADL Phase Entry     SOB Score total 62     Rest 2     Walk 3     Stairs 4     Bath 2     Dress 2     Shop 3       CAT Score   CAT  Score 26       mMRC Score   mMRC Score 1        UCSD: Self-administered rating of dyspnea associated with activities of daily living (ADLs) 6-point scale (0 = not at all to 5 = maximal or unable to do because of breathlessness)  Scoring Scores range from 0 to 120.  Minimally important difference is 5 units  CAT: CAT can identify the health impairment of COPD patients and is better correlated with disease progression.  CAT has a scoring range of zero to 40. The CAT score is classified into four groups of low (less than 10), medium (10 - 20), high (21-30) and very high (31-40) based on the impact level of disease on health status. A CAT score over 10 suggests significant symptoms.  A worsening CAT score could be explained by an exacerbation, poor medication adherence, poor inhaler technique, or progression of COPD or comorbid conditions.  CAT MCID is 2 points  mMRC: mMRC (Modified Medical Research Council) Dyspnea Scale is used to assess the degree of baseline functional disability in patients of respiratory disease  due to dyspnea. No minimal important difference is established. A decrease in score of 1 point or greater is considered a positive change.   Pulmonary Function Assessment:   Exercise Target Goals: Exercise Program Goal: Individual exercise prescription set using results from initial 6 min walk test and THRR while considering  patients activity barriers and safety.   Exercise Prescription Goal: Initial exercise prescription builds to 30-45 minutes a day of aerobic activity, 2-3 days per week.  Home exercise guidelines will be given to patient during program as part of exercise prescription that the participant will acknowledge.  Education: Aerobic Exercise: - Group verbal and visual presentation on the components of exercise prescription. Introduces F.I.T.T principle from ACSM for exercise prescriptions.  Reviews F.I.T.T. principles of aerobic exercise including  progression. Written material provided at class time.   Education: Resistance Exercise: - Group verbal and visual presentation on the components of exercise prescription. Introduces F.I.T.T principle from ACSM for exercise prescriptions  Reviews F.I.T.T. principles of resistance exercise including progression. Written material provided at class time.    Education: Exercise & Equipment Safety: - Individual verbal instruction and demonstration of equipment use and safety with use of the equipment. Flowsheet Row Pulmonary Rehab from 08/15/2024 in [facility] Cardiac and Pulmonary Rehab  Date 08/15/24  Educator [facility]  Instruction Review Code 1- Verbalizes Understanding    Education: Exercise Physiology & General Exercise Guidelines: - Group verbal and written instruction with models to review the exercise physiology of the cardiovascular system and associated critical values. Provides general exercise guidelines with specific guidelines to those with heart or lung disease.    Education: Flexibility, Balance, Mind/Body Relaxation: - Group verbal and visual presentation with interactive activity on the components of exercise prescription. Introduces F.I.T.T principle from ACSM for exercise prescriptions. Reviews F.I.T.T. principles of flexibility and balance exercise training including progression. Also discusses the mind body connection.  Reviews various relaxation techniques to help reduce and manage stress (i.e. Deep breathing, progressive muscle relaxation, and visualization). Balance handout provided to take home. Written material provided at class time.   Activity Barriers & Risk Stratification:  Activity Barriers & Cardiac Risk Stratification - 08/15/24 1513       Activity Barriers & Cardiac Risk Stratification   Activity Barriers Neck/Spine Problems;Joint Problems          6 Minute Walk:  6 Minute Walk     Row Name 08/15/24 1512         6 Minute Walk   Phase Initial     Distance 1250  feet     Walk Time 6 minutes     # of Rest Breaks 0     MPH 2.36     METS 3.35     RPE 11     Perceived Dyspnea  1     VO2 Peak 11.7     Symptoms No     Resting HR 104 bpm     Resting BP 122/78     Resting Oxygen Saturation  95 %     Exercise Oxygen Saturation  during 6 min walk 89 %     Max Ex. HR 104 bpm     Max Ex. BP 116/62     2 Minute Post BP 114/62       Interval HR   1 Minute HR 99     2 Minute HR 88     3 Minute HR 67     4 Minute HR 86     5  Minute HR 93     6 Minute HR 96     2 Minute Post HR 103     Interval Heart Rate? Yes       Interval Oxygen   Interval Oxygen? Yes     Baseline Oxygen Saturation % 95 %     1 Minute Oxygen Saturation % 92 %     1 Minute Liters of Oxygen 0 L     2 Minute Oxygen Saturation % 91 %     2 Minute Liters of Oxygen 0 L     3 Minute Oxygen Saturation % 90 %     3 Minute Liters of Oxygen 0 L     4 Minute Oxygen Saturation % 91 %     4 Minute Liters of Oxygen 0 L     5 Minute Oxygen Saturation % 89 %     5 Minute Liters of Oxygen 0 L     6 Minute Oxygen Saturation % 91 %     6 Minute Liters of Oxygen 0 L     2 Minute Post Oxygen Saturation % 95 %     2 Minute Post Liters of Oxygen 0 L       Oxygen Initial Assessment:  Oxygen Initial Assessment - 08/15/24 1516       Home Oxygen   Home Oxygen Device None    Sleep Oxygen Prescription None    Home Exercise Oxygen Prescription None    Home Resting Oxygen Prescription None    Compliance with Home Oxygen Use Yes      Initial 6 min Walk   Oxygen Used None      Program Oxygen Prescription   Program Oxygen Prescription None      Intervention   Short Term Goals To learn and understand importance of monitoring SPO2 with pulse oximeter and demonstrate accurate use of the pulse oximeter.;To learn and understand importance of maintaining oxygen saturations>88%;To learn and demonstrate proper pursed lip breathing techniques or other breathing techniques. ;To learn and demonstrate  proper use of respiratory medications    Long  Term Goals Maintenance of O2 saturations>88%;Compliance with respiratory medication;Exhibits proper breathing techniques, such as pursed lip breathing or other method taught during program session;Verbalizes importance of monitoring SPO2 with pulse oximeter and return demonstration;Demonstrates proper use of MDIs          Oxygen Re-Evaluation:   Oxygen Discharge (Final Oxygen Re-Evaluation):   Initial Exercise Prescription:  Initial Exercise Prescription - 08/15/24 1500       Date of Initial Exercise RX and Referring Provider   Date 08/15/24    Referring Provider Dr. Dedra Sanders      Oxygen   Maintain Oxygen Saturation 88% or higher      Treadmill   MPH 2.3    Grade 1    Minutes 15    METs 3.08      NuStep   Level 3    SPM 80    Minutes 15    METs 3.35      REL-XR   Level 3    Watts 25    Speed 50    Minutes 15    METs 3.35      Prescription Details   Duration Progress to 30 minutes of continuous aerobic without signs/symptoms of physical distress      Intensity   THRR 40-80% of Max Heartrate 124-144    Ratings of Perceived Exertion 11-13    Perceived Dyspnea 0-4  Progression   Progression Continue to progress workloads to maintain intensity without signs/symptoms of physical distress.      Resistance Training   Training Prescription Yes    Weight 5lb    Reps 10-15          Perform Capillary Blood Glucose checks as needed.  Exercise Prescription Changes:   Exercise Prescription Changes     Row Name 08/15/24 1500             Response to Exercise   Blood Pressure (Admit) 122/78       Blood Pressure (Exercise) 116/62       Blood Pressure (Exit) 114/62       Heart Rate (Admit) 104 bpm       Heart Rate (Exercise) 103 bpm       Heart Rate (Exit) 103 bpm       Oxygen Saturation (Admit) 95 %       Oxygen Saturation (Exercise) 89 %       Oxygen Saturation (Exit) 95 %       Rating of  Perceived Exertion (Exercise) 11       Perceived Dyspnea (Exercise) 1       Symptoms none       Comments results          Exercise Comments:   Exercise Goals and Review:   Exercise Goals     Row Name 08/15/24 1515             Exercise Goals   Increase Physical Activity Yes       Intervention Provide advice, education, support and counseling about physical activity/exercise needs.;Develop an individualized exercise prescription for aerobic and resistive training based on initial evaluation findings, risk stratification, comorbidities and participant's personal goals.       Expected Outcomes Long Term: Exercising regularly at least 3-5 days a week.;Long Term: Add in home exercise to make exercise part of routine and to increase amount of physical activity.;Short Term: Attend rehab on a regular basis to increase amount of physical activity.       Increase Strength and Stamina Yes       Intervention Provide advice, education, support and counseling about physical activity/exercise needs.;Develop an individualized exercise prescription for aerobic and resistive training based on initial evaluation findings, risk stratification, comorbidities and participant's personal goals.       Expected Outcomes Short Term: Increase workloads from initial exercise prescription for resistance, speed, and METs.;Short Term: Perform resistance training exercises routinely during rehab and add in resistance training at home;Long Term: Improve cardiorespiratory fitness, muscular endurance and strength as measured by increased METs and functional capacity ( )       Able to understand and use rate of perceived exertion (RPE) scale Yes       Intervention Provide education and explanation on how to use RPE scale       Expected Outcomes Short Term: Able to use RPE daily in rehab to express subjective intensity level;Long Term:  Able to use RPE to guide intensity level when exercising independently       Able  to understand and use Dyspnea scale Yes       Intervention Provide education and explanation on how to use Dyspnea scale       Expected Outcomes Short Term: Able to use Dyspnea scale daily in rehab to express subjective sense of shortness of breath during exertion;Long Term: Able to use Dyspnea scale to guide intensity level when exercising independently  Knowledge and understanding of Target Heart Rate Range (THRR) Yes       Intervention Provide education and explanation of THRR including how the numbers were predicted and where they are located for reference       Expected Outcomes Short Term: Able to state/look up THRR;Short Term: Able to use daily as guideline for intensity in rehab;Long Term: Able to use THRR to govern intensity when exercising independently       Able to check pulse independently Yes       Intervention Provide education and demonstration on how to check pulse in carotid and radial arteries.;Review the importance of being able to check your own pulse for safety during independent exercise       Expected Outcomes Short Term: Able to explain why pulse checking is important during independent exercise;Long Term: Able to check pulse independently and accurately       Understanding of Exercise Prescription Yes       Intervention Provide education, explanation, and written materials on patient's individual exercise prescription       Expected Outcomes Long Term: Able to explain home exercise prescription to exercise independently;Short Term: Able to explain program exercise prescription          Exercise Goals Re-Evaluation :   Discharge Exercise Prescription (Final Exercise Prescription Changes):  Exercise Prescription Changes - 08/15/24 1500       Response to Exercise   Blood Pressure (Admit) 122/78    Blood Pressure (Exercise) 116/62    Blood Pressure (Exit) 114/62    Heart Rate (Admit) 104 bpm    Heart Rate (Exercise) 103 bpm    Heart Rate (Exit) 103 bpm     Oxygen Saturation (Admit) 95 %    Oxygen Saturation (Exercise) 89 %    Oxygen Saturation (Exit) 95 %    Rating of Perceived Exertion (Exercise) 11    Perceived Dyspnea (Exercise) 1    Symptoms none    Comments results          Nutrition:  Target Goals: Understanding of nutrition guidelines, daily intake of sodium 1500mg , cholesterol 200mg , calories 30% from fat and 7% or less from saturated fats, daily to have 5 or more servings of fruits and vegetables.  Education: Nutrition 1 -Group instruction provided by verbal, written material, interactive activities, discussions, models, and posters to present general guidelines for heart healthy nutrition including macronutrients, label reading, and promoting whole foods over processed counterparts. Education serves as pensions consultant of discussion of heart healthy eating for all. Written material provided at class time.     Education: Nutrition 2 -Group instruction provided by verbal, written material, interactive activities, discussions, models, and posters to present general guidelines for heart healthy nutrition including sodium, cholesterol, and saturated fat. Providing guidance of habit forming to improve blood pressure, cholesterol, and body weight. Written material provided at class time.     Biometrics:  Pre Biometrics - 08/15/24 1516       Pre Biometrics   Height 5' 9.3 (1.76 m)    Weight 153 lb 9.6 oz (69.7 kg)    Waist Circumference 35.5 inches    Hip Circumference 36 inches    Waist to Hip Ratio 0.99 %    BMI (Calculated) 22.49    Single Leg Stand 30 seconds           Nutrition Therapy Plan and Nutrition Goals:   Nutrition Assessments:  MEDIFICTS Score Key: >=70 Need to make dietary changes  40-70 Heart Healthy Diet <=  40 Therapeutic Level Cholesterol Diet  Flowsheet Row Pulmonary Rehab from 08/15/2024 in [facility] Cardiac and Pulmonary Rehab  Picture Your Plate Total Score on Admission 43   Picture Your Plate  Scores: <59 Unhealthy dietary pattern with much room for improvement. 41-50 Dietary pattern unlikely to meet recommendations for good health and room for improvement. 51-60 More healthful dietary pattern, with some room for improvement.  >60 Healthy dietary pattern, although there may be some specific behaviors that could be improved.   Nutrition Goals Re-Evaluation:   Nutrition Goals Discharge (Final Nutrition Goals Re-Evaluation):   Psychosocial: Target Goals: Acknowledge presence or absence of significant depression and/or stress, maximize coping skills, provide positive support system. Participant is able to verbalize types and ability to use techniques and skills needed for reducing stress and depression.   Education: Stress, Anxiety, and Depression - Group verbal and visual presentation to define topics covered.  Reviews how body is impacted by stress, anxiety, and depression.  Also discusses healthy ways to reduce stress and to treat/manage anxiety and depression.  Written material provided at class time.   Education: Sleep Hygiene -Provides group verbal and written instruction about how sleep can affect your health.  Define sleep hygiene, discuss sleep cycles and impact of sleep habits. Review good sleep hygiene tips.    Initial Review & Psychosocial Screening:  Initial Psych Review & Screening - 07/27/24 1040       Initial Review   Current issues with None Identified      Family Dynamics   Good Support System? Yes      Barriers   Psychosocial barriers to participate in program There are no identifiable barriers or psychosocial needs.;The patient should benefit from training in stress management and relaxation.      Screening Interventions   Interventions Encouraged to exercise;Provide feedback about the scores to participant;To provide support and resources with identified psychosocial needs    Expected Outcomes Short Term goal: Utilizing psychosocial counselor, staff  and physician to assist with identification of specific Stressors or current issues interfering with healing process. Setting desired goal for each stressor or current issue identified.;Long Term Goal: Stressors or current issues are controlled or eliminated.;Short Term goal: Identification and review with participant of any Quality of Life or Depression concerns found by scoring the questionnaire.;Long Term goal: The participant improves quality of Life and PHQ9 Scores as seen by post scores and/or verbalization of changes          Quality of Life Scores:  Scores of 19 and below usually indicate a poorer quality of life in these areas.  A difference of  2-3 points is a clinically meaningful difference.  A difference of 2-3 points in the total score of the Quality of Life Index has been associated with significant improvement in overall quality of life, self-image, physical symptoms, and general health in studies assessing change in quality of life.  PHQ-9: Review Flowsheet  More data exists      08/15/2024 05/08/2024 02/20/2024 01/10/2024 01/18/2023  Depression screen PHQ 2/9  Decreased Interest 2 1 2  0 0  Down, Depressed, Hopeless 2 1 1  0 0  PHQ - 2 Score 4 2 3  0 0  Altered sleeping 1 2 3  0 0  Tired, decreased energy 1 2 1  0 1  Change in appetite 0 2 0 0 0  Feeling bad or failure about yourself  1 0 0 0 0  Trouble concentrating 0 0 0 0 0  Moving slowly or fidgety/restless 0 0  0 0 0  Suicidal thoughts 0 0 0 0 0  PHQ-9 Score 7 8  7   0  1   Difficult doing work/chores Not difficult at all Not difficult at all Not difficult at all Not difficult at all Not difficult at all    Details       Data saved with a previous flowsheet row definition        Interpretation of Total Score  Total Score Depression Severity:  1-4 = Minimal depression, 5-9 = Mild depression, 10-14 = Moderate depression, 15-19 = Moderately severe depression, 20-27 = Severe depression   Psychosocial Evaluation and  Intervention:  Psychosocial Evaluation - 07/27/24 1046       Psychosocial Evaluation & Interventions   Interventions Encouraged to exercise with the program and follow exercise prescription    Comments Mr. Shonka is coming to pulmonary rehab with COPD and asthma. He states he has no stress or sleep concerns at this time. He enjoys fishing and hunting when he can. He is interested in attending the program to help with his stamina and strength    Expected Outcomes Short: attend pulmonary rehab for education and exercise. Long: develop and maintain positive self care habits    Continue Psychosocial Services  Follow up required by staff          Psychosocial Re-Evaluation:   Psychosocial Discharge (Final Psychosocial Re-Evaluation):   Education: Education Goals: Education classes will be provided on a weekly basis, covering required topics. Participant will state understanding/return demonstration of topics presented.  Learning Barriers/Preferences:  Learning Barriers/Preferences - 07/27/24 1039       Learning Barriers/Preferences   Learning Barriers None    Learning Preferences Individual Instruction          General Pulmonary Education Topics:  Infection Prevention: - Provides verbal and written material to individual with discussion of infection control including proper hand washing and proper equipment cleaning during exercise session. Flowsheet Row Pulmonary Rehab from 08/15/2024 in [facility] Cardiac and Pulmonary Rehab  Date 08/15/24  Educator [facility]  Instruction Review Code 1- Verbalizes Understanding    Falls Prevention: - Provides verbal and written material to individual with discussion of falls prevention and safety. Flowsheet Row Pulmonary Rehab from 08/15/2024 in [facility] Cardiac and Pulmonary Rehab  Date 08/15/24  Educator [facility]  Instruction Review Code 1- Verbalizes Understanding    Chronic Lung Disease Review: - Group verbal instruction with posters, models, PowerPoint  presentations and videos,  to review new updates, new respiratory medications, new advancements in procedures and treatments. Providing information on websites and 800 numbers for continued self-education. Includes information about supplement oxygen, available portable oxygen systems, continuous and intermittent flow rates, oxygen safety, concentrators, and Medicare reimbursement for oxygen. Explanation of Pulmonary Drugs, including class, frequency, complications, importance of spacers, rinsing mouth after steroid MDI's, and proper cleaning methods for nebulizers. Review of basic lung anatomy and physiology related to function, structure, and complications of lung disease. Review of risk factors. Discussion about methods for diagnosing sleep apnea and types of masks and machines for OSA. Includes a review of the use of types of environmental controls: home humidity, furnaces, filters, dust mite/pet prevention, HEPA vacuums. Discussion about weather changes, air quality and the benefits of nasal washing. Instruction on Warning signs, infection symptoms, calling MD promptly, preventive modes, and value of vaccinations. Review of effective airway clearance, coughing and/or vibration techniques. Emphasizing that all should Create an Action Plan. Written material provided at class time. Flowsheet Row Pulmonary Rehab from  08/15/2024 in [facility] Cardiac and Pulmonary Rehab  Education need identified 08/15/24    AED/CPR: - Group verbal and written instruction with the use of models to demonstrate the basic use of the AED with the basic ABC's of resuscitation.    Tests and Procedures:  - Group verbal and visual presentation and models provide information about basic cardiac anatomy and function. Reviews the testing methods done to diagnose heart disease and the outcomes of the test results. Describes the treatment choices: Medical Management, Angioplasty, or Coronary Bypass Surgery for treating various heart  conditions including Myocardial Infarction, Angina, Valve Disease, and Cardiac Arrhythmias.  Written material provided at class time.   Medication Safety: - Group verbal and visual instruction to review commonly prescribed medications for heart and lung disease. Reviews the medication, class of the drug, and side effects. Includes the steps to properly store meds and maintain the prescription regimen.  Written material given at graduation.   Other: -Provides group and verbal instruction on various topics (see comments)   Knowledge Questionnaire Score:  Knowledge Questionnaire Score - 08/15/24 1520       Knowledge Questionnaire Score   Pre Score 15/18           Core Components/Risk Factors/Patient Goals at Admission:  Personal Goals and Risk Factors at Admission - 07/27/24 1036       Core Components/Risk Factors/Patient Goals on Admission    Weight Management Yes;Weight Maintenance    Intervention Weight Management: Develop a combined nutrition and exercise program designed to reach desired caloric intake, while maintaining appropriate intake of nutrient and fiber, sodium and fats, and appropriate energy expenditure required for the weight goal.;Weight Management: Provide education and appropriate resources to help participant work on and attain dietary goals.;Weight Management/Obesity: Establish reasonable short term and long term weight goals.    Expected Outcomes Short Term: Continue to assess and modify interventions until short term weight is achieved;Long Term: Adherence to nutrition and physical activity/exercise program aimed toward attainment of established weight goal;Weight Maintenance: Understanding of the daily nutrition guidelines, which includes 25-35% calories from fat, 7% or less cal from saturated fats, less than 200mg  cholesterol, less than 1.5gm of sodium, & 5 or more servings of fruits and vegetables daily;Understanding recommendations for meals to include 15-35%  energy as protein, 25-35% energy from fat, 35-60% energy from carbohydrates, less than 200mg  of dietary cholesterol, 20-35 gm of total fiber daily;Understanding of distribution of calorie intake throughout the day with the consumption of 4-5 meals/snacks    Tobacco Cessation Yes    Number of packs per day 1ppd    Intervention Assist the participant in steps to quit. Provide individualized education and counseling about committing to Tobacco Cessation, relapse prevention, and pharmacological support that can be provided by physician.;Education officer, environmental, assist with locating and accessing local/national Quit Smoking programs, and support quit date choice.    Expected Outcomes Short Term: Will demonstrate readiness to quit, by selecting a quit date.;Short Term: Will quit all tobacco product use, adhering to prevention of relapse plan.;Long Term: Complete abstinence from all tobacco products for at least 12 months from quit date.          Education:Diabetes - Individual verbal and written instruction to review signs/symptoms of diabetes, desired ranges of glucose level fasting, after meals and with exercise. Acknowledge that pre and post exercise glucose checks will be done for 3 sessions at entry of program.   Know Your Numbers and Heart Failure: - Group verbal and visual instruction to  discuss disease risk factors for cardiac and pulmonary disease and treatment options.  Reviews associated critical values for Overweight/Obesity, Hypertension, Cholesterol, and Diabetes.  Discusses basics of heart failure: signs/symptoms and treatments.  Introduces Heart Failure Zone chart for action plan for heart failure. Written material provided at class time.   Core Components/Risk Factors/Patient Goals Review:    Core Components/Risk Factors/Patient Goals at Discharge (Final Review):    ITP Comments:  ITP Comments     Row Name 07/27/24 1046 08/15/24 1511         ITP Comments Initial phone  call completed. Diagnosis can be found in [facility] 12/8 . EP Orientation scheduled for Monday 12/29 at 1:45.    Daschel is a current tobacco user. Intervention for tobacco cessation was provided at the initial medical review. He was asked about readiness to quit and reported that he is thinking about quitting and interested in the education provided during the program. Patient was advised and educated about tobacco cessation using combination therapy, tobacco cessation classes, quit line, and quit smoking apps. Patient demonstrated understanding of this material. Staff will continue to provide encouragement and follow up with the patient throughout the program. Completed and gym orientation for pulmonary rehab. Initial ITP created and sent for review to Dr. Fuad Aleskerov, Medical Director.         Comments: Initial ITP     [1]  Current Outpatient Medications:    albuterol  (VENTOLIN  HFA) 108 (90 Base) MCG/ACT inhaler, Inhale 2 puffs into the lungs every 6 (six) hours as needed., Disp: 17 each, Rfl: 2   Bioflavonoid Products (ESTER C PO), Take by mouth daily., Disp: , Rfl:    Cholecalciferol (VITAMIN D3) 50 MCG (2000 UT) capsule, Take 1 capsule (2,000 Units total) by mouth daily., Disp: , Rfl:    cyanocobalamin 100 MCG tablet, Take 100 mcg by mouth daily. , Disp: , Rfl:    dexlansoprazole  (DEXILANT ) 60 MG capsule, Take 1 capsule (60 mg total) by mouth daily., Disp: 90 capsule, Rfl: 3   fish oil-omega-3 fatty acids 1000 MG capsule, Take 2 g by mouth daily., Disp: , Rfl:    fluticasone  (FLONASE ) 50 MCG/ACT nasal spray, SPRAY 2 SPRAYS INTO EACH NOSTRIL EVERY DAY, Disp: 48 mL, Rfl: 3   HORIZANT 300 MG TBCR, Take 1 tablet by mouth at bedtime., Disp: , Rfl:    Multiple Vitamin (MULTIVITAMIN) tablet, Take 1 tablet by mouth daily., Disp: , Rfl:    mupirocin  cream (BACTROBAN ) 2 %, Apply 1 Application topically 2 (two) times daily. Formulate cream/ointment based on cost/insurance coverage, Disp: 30 g, Rfl:  0   nortriptyline  (PAMELOR ) 50 MG capsule, Take 1 capsule (50 mg total) by mouth at bedtime., Disp: 90 capsule, Rfl: 3   oxyCODONE -acetaminophen (PERCOCET/ROXICET) 5-325 MG tablet, Take 1 tablet by mouth at bedtime., Disp: , Rfl:    Polyethylene Glycol 3350  (MIRALAX PO), Take 117 g by mouth once. (Patient not taking: Reported on 07/16/2024), Disp: , Rfl:    Probiotic Product (PROBIOTIC PO), Take by mouth., Disp: , Rfl:    tiZANidine (ZANAFLEX) 2 MG tablet, Take 2 tablets by mouth at bedtime. Takes as needed, Disp: , Rfl:    TRELEGY ELLIPTA  200-62.5-25 MCG/ACT AEPB, INHALE 1 PUFF INTO THE LUNGS DAILY IN THE AFTERNOON., Disp: 60 each, Rfl: 11   Zinc 30 MG TABS, Take 1 tablet by mouth daily., Disp: , Rfl:  [2]  Social History Tobacco Use  Smoking Status Every Day   Current packs/day: 1.00  Average packs/day: 1 pack/day for 40.0 years (40.0 ttl pk-yrs)   Types: Cigarettes  Smokeless Tobacco Never  Tobacco Comments   1 PPD- khj 09/21/2023

## 2024-08-15 NOTE — Patient Instructions (Signed)
 Patient Instructions  Patient Details  Name: Michael [name] MRN: [id number] Date of Birth: 24-Jul-1961 Referring Provider:  Tamea Dedra LITTIE, MD  Below are your personal goals for exercise, nutrition, and risk factors. Our goal is to help you stay on track towards obtaining and maintaining these goals. We will be discussing your progress on these goals with you throughout the program.  Initial Exercise Prescription:  Initial Exercise Prescription - 08/15/24 1500       Date of Initial Exercise RX and Referring Provider   Date 08/15/24    Referring Provider Dr. Dedra Tamea      Oxygen   Maintain Oxygen Saturation 88% or higher      Treadmill   MPH 2.3    Grade 1    Minutes 15    METs 3.08      NuStep   Level 3    SPM 80    Minutes 15    METs 3.35      REL-XR   Level 3    Watts 25    Speed 50    Minutes 15    METs 3.35      Prescription Details   Duration Progress to 30 minutes of continuous aerobic without signs/symptoms of physical distress      Intensity   THRR 40-80% of Max Heartrate 124-144    Ratings of Perceived Exertion 11-13    Perceived Dyspnea 0-4      Progression   Progression Continue to progress workloads to maintain intensity without signs/symptoms of physical distress.      Resistance Training   Training Prescription Yes    Weight 5lb    Reps 10-15          Exercise Goals: Frequency: Be able to perform aerobic exercise two to three times per week in program working toward 2-5 days per week of home exercise.  Intensity: Work with a perceived exertion of 11 (fairly light) - 15 (hard) while following your exercise prescription.  We will make changes to your prescription with you as you progress through the program.   Duration: Be able to do 30 to 45 minutes of continuous aerobic exercise in addition to a 5 minute warm-up and a 5 minute cool-down routine.   Nutrition Goals: Your personal nutrition goals will be established when you do  your nutrition analysis with the dietician.  The following are general nutrition guidelines to follow: Cholesterol < 200mg /day Sodium < 1500mg /day Fiber: Men over 50 yrs - 30 grams per day  Personal Goals:  Personal Goals and Risk Factors at Admission - 07/27/24 1036       Core Components/Risk Factors/Patient Goals on Admission    Weight Management Yes;Weight Maintenance    Intervention Weight Management: Develop a combined nutrition and exercise program designed to reach desired caloric intake, while maintaining appropriate intake of nutrient and fiber, sodium and fats, and appropriate energy expenditure required for the weight goal.;Weight Management: Provide education and appropriate resources to help participant work on and attain dietary goals.;Weight Management/Obesity: Establish reasonable short term and long term weight goals.    Expected Outcomes Short Term: Continue to assess and modify interventions until short term weight is achieved;Long Term: Adherence to nutrition and physical activity/exercise program aimed toward attainment of established weight goal;Weight Maintenance: Understanding of the daily nutrition guidelines, which includes 25-35% calories from fat, 7% or less cal from saturated fats, less than 200mg  cholesterol, less than 1.5gm of sodium, & 5 or more servings of fruits and  vegetables daily;Understanding recommendations for meals to include 15-35% energy as protein, 25-35% energy from fat, 35-60% energy from carbohydrates, less than 200mg  of dietary cholesterol, 20-35 gm of total fiber daily;Understanding of distribution of calorie intake throughout the day with the consumption of 4-5 meals/snacks    Tobacco Cessation Yes    Number of packs per day 1ppd    Intervention Assist the participant in steps to quit. Provide individualized education and counseling about committing to Tobacco Cessation, relapse prevention, and pharmacological support that can be provided by  physician.;Education officer, environmental, assist with locating and accessing local/national Quit Smoking programs, and support quit date choice.    Expected Outcomes Short Term: Will demonstrate readiness to quit, by selecting a quit date.;Short Term: Will quit all tobacco product use, adhering to prevention of relapse plan.;Long Term: Complete abstinence from all tobacco products for at least 12 months from quit date.          Exercise Goals and Review:  Exercise Goals     Row Name 08/15/24 1515             Exercise Goals   Increase Physical Activity Yes       Intervention Provide advice, education, support and counseling about physical activity/exercise needs.;Develop an individualized exercise prescription for aerobic and resistive training based on initial evaluation findings, risk stratification, comorbidities and participant's personal goals.       Expected Outcomes Long Term: Exercising regularly at least 3-5 days a week.;Long Term: Add in home exercise to make exercise part of routine and to increase amount of physical activity.;Short Term: Attend rehab on a regular basis to increase amount of physical activity.       Increase Strength and Stamina Yes       Intervention Provide advice, education, support and counseling about physical activity/exercise needs.;Develop an individualized exercise prescription for aerobic and resistive training based on initial evaluation findings, risk stratification, comorbidities and participant's personal goals.       Expected Outcomes Short Term: Increase workloads from initial exercise prescription for resistance, speed, and METs.;Short Term: Perform resistance training exercises routinely during rehab and add in resistance training at home;Long Term: Improve cardiorespiratory fitness, muscular endurance and strength as measured by increased METs and functional capacity ( )       Able to understand and use rate of perceived exertion (RPE) scale Yes        Intervention Provide education and explanation on how to use RPE scale       Expected Outcomes Short Term: Able to use RPE daily in rehab to express subjective intensity level;Long Term:  Able to use RPE to guide intensity level when exercising independently       Able to understand and use Dyspnea scale Yes       Intervention Provide education and explanation on how to use Dyspnea scale       Expected Outcomes Short Term: Able to use Dyspnea scale daily in rehab to express subjective sense of shortness of breath during exertion;Long Term: Able to use Dyspnea scale to guide intensity level when exercising independently       Knowledge and understanding of Target Heart Rate Range (THRR) Yes       Intervention Provide education and explanation of THRR including how the numbers were predicted and where they are located for reference       Expected Outcomes Short Term: Able to state/look up THRR;Short Term: Able to use daily as guideline for intensity in rehab;Long Term: Able  to use THRR to govern intensity when exercising independently       Able to check pulse independently Yes       Intervention Provide education and demonstration on how to check pulse in carotid and radial arteries.;Review the importance of being able to check your own pulse for safety during independent exercise       Expected Outcomes Short Term: Able to explain why pulse checking is important during independent exercise;Long Term: Able to check pulse independently and accurately       Understanding of Exercise Prescription Yes       Intervention Provide education, explanation, and written materials on patient's individual exercise prescription       Expected Outcomes Long Term: Able to explain home exercise prescription to exercise independently;Short Term: Able to explain program exercise prescription          Copy of goals given to participant.

## 2024-08-22 ENCOUNTER — Encounter: Admitting: Emergency Medicine

## 2024-08-22 ENCOUNTER — Encounter: Payer: Self-pay | Admitting: Gastroenterology

## 2024-08-22 ENCOUNTER — Encounter: Payer: Self-pay | Admitting: *Deleted

## 2024-08-22 ENCOUNTER — Telehealth: Payer: Self-pay | Admitting: Gastroenterology

## 2024-08-22 DIAGNOSIS — J4489 Other specified chronic obstructive pulmonary disease: Secondary | ICD-10-CM

## 2024-08-22 NOTE — Telephone Encounter (Signed)
 Attempt to reach pt but wife stated he was not home. She states he will be home after 1pm. RN sinfomed wife that we will call back after 1

## 2024-08-22 NOTE — Progress Notes (Signed)
 Daily Session Note  Patient Details  Name: Michael [name] MRN: [id number] Date of Birth: May 26, 1961 Referring Provider:   Flowsheet Row Pulmonary Rehab from 08/15/2024 in [facility]  Referring Provider Dr. Dedra Sanders    Encounter Date: 08/22/2024  Check In:  Session Check In - 08/22/24 1103       Check-In   Supervising physician immediately available to respond to emergencies See telemetry face sheet for immediately available ER MD    Location [facility]    Staff Present Leita Franks RN,BSN;[facility] BS, Exercise Physiologist;Margaret Best, MS, Exercise Physiologist    Virtual Visit No    Medication changes reported     No    Fall or balance concerns reported    No    Tobacco Cessation No Change    Warm-up and Cool-down Performed on first and last piece of equipment    Resistance Training Performed Yes    VAD Patient? No    PAD/SET Patient? No      Pain Assessment   Currently in Pain? No/denies             Tobacco Use History[1]  Goals Met:  Proper associated with RPD/PD & O2 Sat Independence with exercise equipment Using PLB without cueing & demonstrates good technique Exercise tolerated well No report of concerns or symptoms today Strength training completed today  Goals Unmet:  Not Applicable  Comments: First full day of exercise!  Patient was oriented to gym and equipment including functions, settings, policies, and procedures.  Patient's individual exercise prescription and treatment plan were reviewed.  All starting workloads were established based on the results of the 6 minute walk test done at initial orientation visit.  The plan for exercise progression was also introduced and progression will be customized based on patient's performance and goals.    Dr. Oneil Pinal is Medical Director for [facility].  Dr. Fuad Aleskerov is Medical Director for [facility]  Pulmonary Rehabilitation.    [1]  Social History Tobacco Use  Smoking Status Every Day   Current packs/day: 1.00   Average packs/day: 1 pack/day for 40.0 years (40.0 ttl pk-yrs)   Types: Cigarettes  Smokeless Tobacco Never  Tobacco Comments   1 PPD- khj 09/21/2023

## 2024-08-22 NOTE — Telephone Encounter (Signed)
 Incoming call from pts spouse regarding prep instructions for upcoming colonoscopy. Pts procedure was rescheduled for 08/27/2024 @10am  and needs updated instructions. Please advise. Thank you.

## 2024-08-22 NOTE — Telephone Encounter (Signed)
 Copy of new dates and time instructions sent to My Chart

## 2024-08-22 NOTE — Progress Notes (Signed)
 Pulmonary Individual Treatment Plan  Patient Details  Name: Michael [name] MRN: [id number] Date of Birth: 1961/01/12 Referring Provider:   Flowsheet Row Pulmonary Rehab from 08/15/2024 in [facility] Cardiac and Pulmonary Rehab  Referring Provider Dr. Dedra Sanders    Initial Encounter Date:  Flowsheet Row Pulmonary Rehab from 08/15/2024 in [facility] Cardiac and Pulmonary Rehab  Date 08/15/24    Visit Diagnosis: COPD with asthma [facility])  Patient's Home Medications on Admission: Current Medications[1]  Past Medical History: Past Medical History:  Diagnosis Date   Adenomatous colon polyp 02/2008   AK (actinic keratosis)    Allergy    Basal cell carcinoma of face    Mohs procedure   COPD (chronic obstructive pulmonary disease) (HCC)    COVID-19 virus infection 03/15/2022   COVID-19 virus infection 03/15/2022   Dyslipidemia 02/29/2016   Emphysema lung (HCC)    Esophagitis 02/13/2001   Gastritis 02/13/2001   GERD (gastroesophageal reflux disease)    Hemorrhoids    Hiatal hernia    HNP (herniated nucleus pulposus), cervical 09/30/2016   With radiculopathy, s/p anterior discectomy [facility]) 12/2016   Internal hemorrhoids    Reactive depression (situational)    Renal stones    Smoker     Tobacco Use: Tobacco Use History[2]  Labs: Review Flowsheet  More data exists      Latest Ref Rng & Units 12/17/2020 12/22/2021 02/26/2022 01/10/2023 02/13/2024  Labs for ITP Cardiac and Pulmonary Rehab  Cholestrol 0 - 200 mg/dL 818  799  809  819  805   LDL (calc) 0 - 99 mg/dL 896  - - 99  893   Direct LDL mg/dL - 886.9  889.9  - -  HDL-C >39.00 mg/dL 59.09  52.99  59.29  56.69  44.10   Trlycerides 0.0 - 149.0 mg/dL 814.9  715.9  742.9  812.9  222.0      Pulmonary Assessment Scores:  Pulmonary Assessment Scores     Row Name 08/15/24 1517         ADL UCSD   ADL Phase Entry     SOB Score total 62     Rest 2     Walk 3     Stairs 4     Bath 2     Dress 2     Shop 3       CAT Score   CAT  Score 26       mMRC Score   mMRC Score 1        UCSD: Self-administered rating of dyspnea associated with activities of daily living (ADLs) 6-point scale (0 = not at all to 5 = maximal or unable to do because of breathlessness)  Scoring Scores range from 0 to 120.  Minimally important difference is 5 units  CAT: CAT can identify the health impairment of COPD patients and is better correlated with disease progression.  CAT has a scoring range of zero to 40. The CAT score is classified into four groups of low (less than 10), medium (10 - 20), high (21-30) and very high (31-40) based on the impact level of disease on health status. A CAT score over 10 suggests significant symptoms.  A worsening CAT score could be explained by an exacerbation, poor medication adherence, poor inhaler technique, or progression of COPD or comorbid conditions.  CAT MCID is 2 points  mMRC: mMRC (Modified Medical Research Council) Dyspnea Scale is used to assess the degree of baseline functional disability in patients of respiratory disease  due to dyspnea. No minimal important difference is established. A decrease in score of 1 point or greater is considered a positive change.   Pulmonary Function Assessment:   Exercise Target Goals: Exercise Program Goal: Individual exercise prescription set using results from initial 6 min walk test and THRR while considering  patients activity barriers and safety.   Exercise Prescription Goal: Initial exercise prescription builds to 30-45 minutes a day of aerobic activity, 2-3 days per week.  Home exercise guidelines will be given to patient during program as part of exercise prescription that the participant will acknowledge.  Education: Aerobic Exercise: - Group verbal and visual presentation on the components of exercise prescription. Introduces F.I.T.T principle from ACSM for exercise prescriptions.  Reviews F.I.T.T. principles of aerobic exercise including  progression. Written material provided at class time.   Education: Resistance Exercise: - Group verbal and visual presentation on the components of exercise prescription. Introduces F.I.T.T principle from ACSM for exercise prescriptions  Reviews F.I.T.T. principles of resistance exercise including progression. Written material provided at class time.    Education: Exercise & Equipment Safety: - Individual verbal instruction and demonstration of equipment use and safety with use of the equipment. Flowsheet Row Pulmonary Rehab from 08/15/2024 in [facility] Cardiac and Pulmonary Rehab  Date 08/15/24  Educator [facility]  Instruction Review Code 1- Verbalizes Understanding    Education: Exercise Physiology & General Exercise Guidelines: - Group verbal and written instruction with models to review the exercise physiology of the cardiovascular system and associated critical values. Provides general exercise guidelines with specific guidelines to those with heart or lung disease.    Education: Flexibility, Balance, Mind/Body Relaxation: - Group verbal and visual presentation with interactive activity on the components of exercise prescription. Introduces F.I.T.T principle from ACSM for exercise prescriptions. Reviews F.I.T.T. principles of flexibility and balance exercise training including progression. Also discusses the mind body connection.  Reviews various relaxation techniques to help reduce and manage stress (i.e. Deep breathing, progressive muscle relaxation, and visualization). Balance handout provided to take home. Written material provided at class time.   Activity Barriers & Risk Stratification:  Activity Barriers & Cardiac Risk Stratification - 08/15/24 1513       Activity Barriers & Cardiac Risk Stratification   Activity Barriers Neck/Spine Problems;Joint Problems          6 Minute Walk:  6 Minute Walk     Row Name 08/15/24 1512         6 Minute Walk   Phase Initial     Distance 1250  feet     Walk Time 6 minutes     # of Rest Breaks 0     MPH 2.36     METS 3.35     RPE 11     Perceived Dyspnea  1     VO2 Peak 11.7     Symptoms No     Resting HR 104 bpm     Resting BP 122/78     Resting Oxygen Saturation  95 %     Exercise Oxygen Saturation  during 6 min walk 89 %     Max Ex. HR 104 bpm     Max Ex. BP 116/62     2 Minute Post BP 114/62       Interval HR   1 Minute HR 99     2 Minute HR 88     3 Minute HR 67     4 Minute HR 86     5  Minute HR 93     6 Minute HR 96     2 Minute Post HR 103     Interval Heart Rate? Yes       Interval Oxygen   Interval Oxygen? Yes     Baseline Oxygen Saturation % 95 %     1 Minute Oxygen Saturation % 92 %     1 Minute Liters of Oxygen 0 L     2 Minute Oxygen Saturation % 91 %     2 Minute Liters of Oxygen 0 L     3 Minute Oxygen Saturation % 90 %     3 Minute Liters of Oxygen 0 L     4 Minute Oxygen Saturation % 91 %     4 Minute Liters of Oxygen 0 L     5 Minute Oxygen Saturation % 89 %     5 Minute Liters of Oxygen 0 L     6 Minute Oxygen Saturation % 91 %     6 Minute Liters of Oxygen 0 L     2 Minute Post Oxygen Saturation % 95 %     2 Minute Post Liters of Oxygen 0 L       Oxygen Initial Assessment:  Oxygen Initial Assessment - 08/15/24 1516       Home Oxygen   Home Oxygen Device None    Sleep Oxygen Prescription None    Home Exercise Oxygen Prescription None    Home Resting Oxygen Prescription None    Compliance with Home Oxygen Use Yes      Initial 6 min Walk   Oxygen Used None      Program Oxygen Prescription   Program Oxygen Prescription None      Intervention   Short Term Goals To learn and understand importance of monitoring SPO2 with pulse oximeter and demonstrate accurate use of the pulse oximeter.;To learn and understand importance of maintaining oxygen saturations>88%;To learn and demonstrate proper pursed lip breathing techniques or other breathing techniques. ;To learn and demonstrate  proper use of respiratory medications    Long  Term Goals Maintenance of O2 saturations>88%;Compliance with respiratory medication;Exhibits proper breathing techniques, such as pursed lip breathing or other method taught during program session;Verbalizes importance of monitoring SPO2 with pulse oximeter and return demonstration;Demonstrates proper use of MDIs          Oxygen Re-Evaluation:   Oxygen Discharge (Final Oxygen Re-Evaluation):   Initial Exercise Prescription:  Initial Exercise Prescription - 08/15/24 1500       Date of Initial Exercise RX and Referring Provider   Date 08/15/24    Referring Provider Dr. Dedra Sanders      Oxygen   Maintain Oxygen Saturation 88% or higher      Treadmill   MPH 2.3    Grade 1    Minutes 15    METs 3.08      NuStep   Level 3    SPM 80    Minutes 15    METs 3.35      REL-XR   Level 3    Watts 25    Speed 50    Minutes 15    METs 3.35      Prescription Details   Duration Progress to 30 minutes of continuous aerobic without signs/symptoms of physical distress      Intensity   THRR 40-80% of Max Heartrate 124-144    Ratings of Perceived Exertion 11-13    Perceived Dyspnea 0-4  Progression   Progression Continue to progress workloads to maintain intensity without signs/symptoms of physical distress.      Resistance Training   Training Prescription Yes    Weight 5lb    Reps 10-15          Perform Capillary Blood Glucose checks as needed.  Exercise Prescription Changes:   Exercise Prescription Changes     Row Name 08/15/24 1500             Response to Exercise   Blood Pressure (Admit) 122/78       Blood Pressure (Exercise) 116/62       Blood Pressure (Exit) 114/62       Heart Rate (Admit) 104 bpm       Heart Rate (Exercise) 103 bpm       Heart Rate (Exit) 103 bpm       Oxygen Saturation (Admit) 95 %       Oxygen Saturation (Exercise) 89 %       Oxygen Saturation (Exit) 95 %       Rating of  Perceived Exertion (Exercise) 11       Perceived Dyspnea (Exercise) 1       Symptoms none       Comments results          Exercise Comments:   Exercise Goals and Review:   Exercise Goals     Row Name 08/15/24 1515             Exercise Goals   Increase Physical Activity Yes       Intervention Provide advice, education, support and counseling about physical activity/exercise needs.;Develop an individualized exercise prescription for aerobic and resistive training based on initial evaluation findings, risk stratification, comorbidities and participant's personal goals.       Expected Outcomes Long Term: Exercising regularly at least 3-5 days a week.;Long Term: Add in home exercise to make exercise part of routine and to increase amount of physical activity.;Short Term: Attend rehab on a regular basis to increase amount of physical activity.       Increase Strength and Stamina Yes       Intervention Provide advice, education, support and counseling about physical activity/exercise needs.;Develop an individualized exercise prescription for aerobic and resistive training based on initial evaluation findings, risk stratification, comorbidities and participant's personal goals.       Expected Outcomes Short Term: Increase workloads from initial exercise prescription for resistance, speed, and METs.;Short Term: Perform resistance training exercises routinely during rehab and add in resistance training at home;Long Term: Improve cardiorespiratory fitness, muscular endurance and strength as measured by increased METs and functional capacity ( )       Able to understand and use rate of perceived exertion (RPE) scale Yes       Intervention Provide education and explanation on how to use RPE scale       Expected Outcomes Short Term: Able to use RPE daily in rehab to express subjective intensity level;Long Term:  Able to use RPE to guide intensity level when exercising independently       Able  to understand and use Dyspnea scale Yes       Intervention Provide education and explanation on how to use Dyspnea scale       Expected Outcomes Short Term: Able to use Dyspnea scale daily in rehab to express subjective sense of shortness of breath during exertion;Long Term: Able to use Dyspnea scale to guide intensity level when exercising independently  Knowledge and understanding of Target Heart Rate Range (THRR) Yes       Intervention Provide education and explanation of THRR including how the numbers were predicted and where they are located for reference       Expected Outcomes Short Term: Able to state/look up THRR;Short Term: Able to use daily as guideline for intensity in rehab;Long Term: Able to use THRR to govern intensity when exercising independently       Able to check pulse independently Yes       Intervention Provide education and demonstration on how to check pulse in carotid and radial arteries.;Review the importance of being able to check your own pulse for safety during independent exercise       Expected Outcomes Short Term: Able to explain why pulse checking is important during independent exercise;Long Term: Able to check pulse independently and accurately       Understanding of Exercise Prescription Yes       Intervention Provide education, explanation, and written materials on patient's individual exercise prescription       Expected Outcomes Long Term: Able to explain home exercise prescription to exercise independently;Short Term: Able to explain program exercise prescription          Exercise Goals Re-Evaluation :   Discharge Exercise Prescription (Final Exercise Prescription Changes):  Exercise Prescription Changes - 08/15/24 1500       Response to Exercise   Blood Pressure (Admit) 122/78    Blood Pressure (Exercise) 116/62    Blood Pressure (Exit) 114/62    Heart Rate (Admit) 104 bpm    Heart Rate (Exercise) 103 bpm    Heart Rate (Exit) 103 bpm     Oxygen Saturation (Admit) 95 %    Oxygen Saturation (Exercise) 89 %    Oxygen Saturation (Exit) 95 %    Rating of Perceived Exertion (Exercise) 11    Perceived Dyspnea (Exercise) 1    Symptoms none    Comments results          Nutrition:  Target Goals: Understanding of nutrition guidelines, daily intake of sodium 1500mg , cholesterol 200mg , calories 30% from fat and 7% or less from saturated fats, daily to have 5 or more servings of fruits and vegetables.  Education: Nutrition 1 -Group instruction provided by verbal, written material, interactive activities, discussions, models, and posters to present general guidelines for heart healthy nutrition including macronutrients, label reading, and promoting whole foods over processed counterparts. Education serves as pensions consultant of discussion of heart healthy eating for all. Written material provided at class time.     Education: Nutrition 2 -Group instruction provided by verbal, written material, interactive activities, discussions, models, and posters to present general guidelines for heart healthy nutrition including sodium, cholesterol, and saturated fat. Providing guidance of habit forming to improve blood pressure, cholesterol, and body weight. Written material provided at class time.     Biometrics:  Pre Biometrics - 08/15/24 1516       Pre Biometrics   Height 5' 9.3 (1.76 m)    Weight 153 lb 9.6 oz (69.7 kg)    Waist Circumference 35.5 inches    Hip Circumference 36 inches    Waist to Hip Ratio 0.99 %    BMI (Calculated) 22.49    Single Leg Stand 30 seconds           Nutrition Therapy Plan and Nutrition Goals:   Nutrition Assessments:  MEDIFICTS Score Key: >=70 Need to make dietary changes  40-70 Heart Healthy Diet <=  40 Therapeutic Level Cholesterol Diet  Flowsheet Row Pulmonary Rehab from 08/15/2024 in [facility] Cardiac and Pulmonary Rehab  Picture Your Plate Total Score on Admission 43   Picture Your Plate  Scores: <59 Unhealthy dietary pattern with much room for improvement. 41-50 Dietary pattern unlikely to meet recommendations for good health and room for improvement. 51-60 More healthful dietary pattern, with some room for improvement.  >60 Healthy dietary pattern, although there may be some specific behaviors that could be improved.   Nutrition Goals Re-Evaluation:   Nutrition Goals Discharge (Final Nutrition Goals Re-Evaluation):   Psychosocial: Target Goals: Acknowledge presence or absence of significant depression and/or stress, maximize coping skills, provide positive support system. Participant is able to verbalize types and ability to use techniques and skills needed for reducing stress and depression.   Education: Stress, Anxiety, and Depression - Group verbal and visual presentation to define topics covered.  Reviews how body is impacted by stress, anxiety, and depression.  Also discusses healthy ways to reduce stress and to treat/manage anxiety and depression.  Written material provided at class time.   Education: Sleep Hygiene -Provides group verbal and written instruction about how sleep can affect your health.  Define sleep hygiene, discuss sleep cycles and impact of sleep habits. Review good sleep hygiene tips.    Initial Review & Psychosocial Screening:  Initial Psych Review & Screening - 07/27/24 1040       Initial Review   Current issues with None Identified      Family Dynamics   Good Support System? Yes      Barriers   Psychosocial barriers to participate in program There are no identifiable barriers or psychosocial needs.;The patient should benefit from training in stress management and relaxation.      Screening Interventions   Interventions Encouraged to exercise;Provide feedback about the scores to participant;To provide support and resources with identified psychosocial needs    Expected Outcomes Short Term goal: Utilizing psychosocial counselor, staff  and physician to assist with identification of specific Stressors or current issues interfering with healing process. Setting desired goal for each stressor or current issue identified.;Long Term Goal: Stressors or current issues are controlled or eliminated.;Short Term goal: Identification and review with participant of any Quality of Life or Depression concerns found by scoring the questionnaire.;Long Term goal: The participant improves quality of Life and PHQ9 Scores as seen by post scores and/or verbalization of changes          Quality of Life Scores:  Scores of 19 and below usually indicate a poorer quality of life in these areas.  A difference of  2-3 points is a clinically meaningful difference.  A difference of 2-3 points in the total score of the Quality of Life Index has been associated with significant improvement in overall quality of life, self-image, physical symptoms, and general health in studies assessing change in quality of life.  PHQ-9: Review Flowsheet  More data exists      08/15/2024 05/08/2024 02/20/2024 01/10/2024 01/18/2023  Depression screen PHQ 2/9  Decreased Interest 2 1 2  0 0  Down, Depressed, Hopeless 2 1 1  0 0  PHQ - 2 Score 4 2 3  0 0  Altered sleeping 1 2 3  0 0  Tired, decreased energy 1 2 1  0 1  Change in appetite 0 2 0 0 0  Feeling bad or failure about yourself  1 0 0 0 0  Trouble concentrating 0 0 0 0 0  Moving slowly or fidgety/restless 0 0  0 0 0  Suicidal thoughts 0 0 0 0 0  PHQ-9 Score 7 8  7   0  1   Difficult doing work/chores Not difficult at all Not difficult at all Not difficult at all Not difficult at all Not difficult at all    Details       Data saved with a previous flowsheet row definition        Interpretation of Total Score  Total Score Depression Severity:  1-4 = Minimal depression, 5-9 = Mild depression, 10-14 = Moderate depression, 15-19 = Moderately severe depression, 20-27 = Severe depression   Psychosocial Evaluation and  Intervention:  Psychosocial Evaluation - 07/27/24 1046       Psychosocial Evaluation & Interventions   Interventions Encouraged to exercise with the program and follow exercise prescription    Comments Mr. Holness is coming to pulmonary rehab with COPD and asthma. He states he has no stress or sleep concerns at this time. He enjoys fishing and hunting when he can. He is interested in attending the program to help with his stamina and strength    Expected Outcomes Short: attend pulmonary rehab for education and exercise. Long: develop and maintain positive self care habits    Continue Psychosocial Services  Follow up required by staff          Psychosocial Re-Evaluation:   Psychosocial Discharge (Final Psychosocial Re-Evaluation):   Education: Education Goals: Education classes will be provided on a weekly basis, covering required topics. Participant will state understanding/return demonstration of topics presented.  Learning Barriers/Preferences:  Learning Barriers/Preferences - 07/27/24 1039       Learning Barriers/Preferences   Learning Barriers None    Learning Preferences Individual Instruction          General Pulmonary Education Topics:  Infection Prevention: - Provides verbal and written material to individual with discussion of infection control including proper hand washing and proper equipment cleaning during exercise session. Flowsheet Row Pulmonary Rehab from 08/15/2024 in [facility] Cardiac and Pulmonary Rehab  Date 08/15/24  Educator [facility]  Instruction Review Code 1- Verbalizes Understanding    Falls Prevention: - Provides verbal and written material to individual with discussion of falls prevention and safety. Flowsheet Row Pulmonary Rehab from 08/15/2024 in [facility] Cardiac and Pulmonary Rehab  Date 08/15/24  Educator [facility]  Instruction Review Code 1- Verbalizes Understanding    Chronic Lung Disease Review: - Group verbal instruction with posters, models, PowerPoint  presentations and videos,  to review new updates, new respiratory medications, new advancements in procedures and treatments. Providing information on websites and 800 numbers for continued self-education. Includes information about supplement oxygen, available portable oxygen systems, continuous and intermittent flow rates, oxygen safety, concentrators, and Medicare reimbursement for oxygen. Explanation of Pulmonary Drugs, including class, frequency, complications, importance of spacers, rinsing mouth after steroid MDI's, and proper cleaning methods for nebulizers. Review of basic lung anatomy and physiology related to function, structure, and complications of lung disease. Review of risk factors. Discussion about methods for diagnosing sleep apnea and types of masks and machines for OSA. Includes a review of the use of types of environmental controls: home humidity, furnaces, filters, dust mite/pet prevention, HEPA vacuums. Discussion about weather changes, air quality and the benefits of nasal washing. Instruction on Warning signs, infection symptoms, calling MD promptly, preventive modes, and value of vaccinations. Review of effective airway clearance, coughing and/or vibration techniques. Emphasizing that all should Create an Action Plan. Written material provided at class time. Flowsheet Row Pulmonary Rehab from  08/15/2024 in [facility] Cardiac and Pulmonary Rehab  Education need identified 08/15/24    AED/CPR: - Group verbal and written instruction with the use of models to demonstrate the basic use of the AED with the basic ABC's of resuscitation.    Tests and Procedures:  - Group verbal and visual presentation and models provide information about basic cardiac anatomy and function. Reviews the testing methods done to diagnose heart disease and the outcomes of the test results. Describes the treatment choices: Medical Management, Angioplasty, or Coronary Bypass Surgery for treating various heart  conditions including Myocardial Infarction, Angina, Valve Disease, and Cardiac Arrhythmias.  Written material provided at class time.   Medication Safety: - Group verbal and visual instruction to review commonly prescribed medications for heart and lung disease. Reviews the medication, class of the drug, and side effects. Includes the steps to properly store meds and maintain the prescription regimen.  Written material given at graduation.   Other: -Provides group and verbal instruction on various topics (see comments)   Knowledge Questionnaire Score:  Knowledge Questionnaire Score - 08/15/24 1520       Knowledge Questionnaire Score   Pre Score 15/18           Core Components/Risk Factors/Patient Goals at Admission:  Personal Goals and Risk Factors at Admission - 07/27/24 1036       Core Components/Risk Factors/Patient Goals on Admission    Weight Management Yes;Weight Maintenance    Intervention Weight Management: Develop a combined nutrition and exercise program designed to reach desired caloric intake, while maintaining appropriate intake of nutrient and fiber, sodium and fats, and appropriate energy expenditure required for the weight goal.;Weight Management: Provide education and appropriate resources to help participant work on and attain dietary goals.;Weight Management/Obesity: Establish reasonable short term and long term weight goals.    Expected Outcomes Short Term: Continue to assess and modify interventions until short term weight is achieved;Long Term: Adherence to nutrition and physical activity/exercise program aimed toward attainment of established weight goal;Weight Maintenance: Understanding of the daily nutrition guidelines, which includes 25-35% calories from fat, 7% or less cal from saturated fats, less than 200mg  cholesterol, less than 1.5gm of sodium, & 5 or more servings of fruits and vegetables daily;Understanding recommendations for meals to include 15-35%  energy as protein, 25-35% energy from fat, 35-60% energy from carbohydrates, less than 200mg  of dietary cholesterol, 20-35 gm of total fiber daily;Understanding of distribution of calorie intake throughout the day with the consumption of 4-5 meals/snacks    Tobacco Cessation Yes    Number of packs per day 1ppd    Intervention Assist the participant in steps to quit. Provide individualized education and counseling about committing to Tobacco Cessation, relapse prevention, and pharmacological support that can be provided by physician.;Education officer, environmental, assist with locating and accessing local/national Quit Smoking programs, and support quit date choice.    Expected Outcomes Short Term: Will demonstrate readiness to quit, by selecting a quit date.;Short Term: Will quit all tobacco product use, adhering to prevention of relapse plan.;Long Term: Complete abstinence from all tobacco products for at least 12 months from quit date.          Education:Diabetes - Individual verbal and written instruction to review signs/symptoms of diabetes, desired ranges of glucose level fasting, after meals and with exercise. Acknowledge that pre and post exercise glucose checks will be done for 3 sessions at entry of program.   Know Your Numbers and Heart Failure: - Group verbal and visual instruction to  discuss disease risk factors for cardiac and pulmonary disease and treatment options.  Reviews associated critical values for Overweight/Obesity, Hypertension, Cholesterol, and Diabetes.  Discusses basics of heart failure: signs/symptoms and treatments.  Introduces Heart Failure Zone chart for action plan for heart failure. Written material provided at class time.   Core Components/Risk Factors/Patient Goals Review:    Core Components/Risk Factors/Patient Goals at Discharge (Final Review):    ITP Comments:  ITP Comments     Row Name 07/27/24 1046 08/15/24 1511 08/22/24 1046       ITP Comments  Initial phone call completed. Diagnosis can be found in [facility] 12/8 . EP Orientation scheduled for Monday 12/29 at 1:45.    Nameer is a current tobacco user. Intervention for tobacco cessation was provided at the initial medical review. He was asked about readiness to quit and reported that he is thinking about quitting and interested in the education provided during the program. Patient was advised and educated about tobacco cessation using combination therapy, tobacco cessation classes, quit line, and quit smoking apps. Patient demonstrated understanding of this material. Staff will continue to provide encouragement and follow up with the patient throughout the program. Completed and gym orientation for pulmonary rehab. Initial ITP created and sent for review to Dr. Fuad Aleskerov, Medical Director. 30 Day review completed. Medical Director ITP review done, changes made as directed, and signed approval by Medical Director. New to program        Comments: 30 Day Review      [1]  Current Outpatient Medications:    albuterol  (VENTOLIN  HFA) 108 (90 Base) MCG/ACT inhaler, Inhale 2 puffs into the lungs every 6 (six) hours as needed., Disp: 17 each, Rfl: 2   Bioflavonoid Products (ESTER C PO), Take by mouth daily., Disp: , Rfl:    Cholecalciferol (VITAMIN D3) 50 MCG (2000 UT) capsule, Take 1 capsule (2,000 Units total) by mouth daily., Disp: , Rfl:    cyanocobalamin 100 MCG tablet, Take 100 mcg by mouth daily. , Disp: , Rfl:    dexlansoprazole  (DEXILANT ) 60 MG capsule, Take 1 capsule (60 mg total) by mouth daily., Disp: 90 capsule, Rfl: 3   fish oil-omega-3 fatty acids 1000 MG capsule, Take 2 g by mouth daily., Disp: , Rfl:    fluticasone  (FLONASE ) 50 MCG/ACT nasal spray, SPRAY 2 SPRAYS INTO EACH NOSTRIL EVERY DAY, Disp: 48 mL, Rfl: 3   HORIZANT 300 MG TBCR, Take 1 tablet by mouth at bedtime., Disp: , Rfl:    Multiple Vitamin (MULTIVITAMIN) tablet, Take 1 tablet by mouth daily., Disp: , Rfl:     mupirocin  cream (BACTROBAN ) 2 %, Apply 1 Application topically 2 (two) times daily. Formulate cream/ointment based on cost/insurance coverage, Disp: 30 g, Rfl: 0   nortriptyline  (PAMELOR ) 50 MG capsule, Take 1 capsule (50 mg total) by mouth at bedtime., Disp: 90 capsule, Rfl: 3   oxyCODONE -acetaminophen (PERCOCET/ROXICET) 5-325 MG tablet, Take 1 tablet by mouth at bedtime., Disp: , Rfl:    Polyethylene Glycol 3350  (MIRALAX PO), Take 117 g by mouth once. (Patient not taking: Reported on 07/16/2024), Disp: , Rfl:    Probiotic Product (PROBIOTIC PO), Take by mouth., Disp: , Rfl:    tiZANidine (ZANAFLEX) 2 MG tablet, Take 2 tablets by mouth at bedtime. Takes as needed, Disp: , Rfl:    TRELEGY ELLIPTA  200-62.5-25 MCG/ACT AEPB, INHALE 1 PUFF INTO THE LUNGS DAILY IN THE AFTERNOON., Disp: 60 each, Rfl: 11   Zinc 30 MG TABS, Take 1 tablet by mouth  daily., Disp: , Rfl:  [2]  Social History Tobacco Use  Smoking Status Every Day   Current packs/day: 1.00   Average packs/day: 1 pack/day for 40.0 years (40.0 ttl pk-yrs)   Types: Cigarettes  Smokeless Tobacco Never  Tobacco Comments   1 PPD- khj 09/21/2023

## 2024-08-24 ENCOUNTER — Encounter

## 2024-08-24 ENCOUNTER — Encounter: Admitting: Gastroenterology

## 2024-08-24 DIAGNOSIS — J4489 Other specified chronic obstructive pulmonary disease: Secondary | ICD-10-CM | POA: Diagnosis not present

## 2024-08-24 NOTE — Progress Notes (Signed)
 Daily Session Note  Patient Details  Name: Michael [name] MRN: [id number] Date of Birth: Jan 04, 1961 Referring Provider:   Flowsheet Row Pulmonary Rehab from 08/15/2024 in [facility]  Referring Provider Dr. Dedra Sanders    Encounter Date: 08/24/2024  Check In:  Session Check In - 08/24/24 1051       Check-In   Supervising physician immediately available to respond to emergencies See telemetry face sheet for immediately available ER MD    Location [facility]iac & Pulmonary [facility]    Staff Present Burnard Davenport RN,BSN,MPA;Joseph Hood RCP,RRT,BSRT;Maxon Conetta BS, Exercise Physiologist;Noah Tickle, BS, Exercise Physiologist    Virtual Visit No    Medication changes reported     No    Fall or balance concerns reported    No    Tobacco Cessation No Change    Warm-up and Cool-down Performed on first and last piece of equipment    Resistance Training Performed Yes    VAD Patient? No    PAD/SET Patient? No      Pain Assessment   Currently in Pain? No/denies             Tobacco Use History[1]  Goals Met:  Proper associated with RPD/PD & O2 Sat Independence with exercise equipment Using PLB without cueing & demonstrates good technique Exercise tolerated well No report of concerns or symptoms today Strength training completed today  Goals Unmet:  Not Applicable  Comments: Pt able to follow exercise prescription today without complaint.  Will continue to monitor for progression.    Dr. Oneil Pinal is Medical Director for [facility].  Dr. Fuad Aleskerov is Medical Director for [facility].    [1]  Social History Tobacco Use  Smoking Status Every Day   Current packs/day: 1.00   Average packs/day: 1 pack/day for 40.0 years (40.0 ttl pk-yrs)   Types: Cigarettes  Smokeless Tobacco Never  Tobacco Comments   1 PPD- khj 09/21/2023

## 2024-08-27 ENCOUNTER — Encounter: Payer: Self-pay | Admitting: Gastroenterology

## 2024-08-27 ENCOUNTER — Ambulatory Visit: Admitting: Gastroenterology

## 2024-08-27 VITALS — BP 147/93 | HR 80 | Temp 98.4°F | Resp 11 | Ht 68.0 in | Wt 154.0 lb

## 2024-08-27 DIAGNOSIS — D127 Benign neoplasm of rectosigmoid junction: Secondary | ICD-10-CM | POA: Diagnosis not present

## 2024-08-27 DIAGNOSIS — Z860100 Personal history of colon polyps, unspecified: Secondary | ICD-10-CM

## 2024-08-27 DIAGNOSIS — D122 Benign neoplasm of ascending colon: Secondary | ICD-10-CM | POA: Diagnosis not present

## 2024-08-27 DIAGNOSIS — D124 Benign neoplasm of descending colon: Secondary | ICD-10-CM

## 2024-08-27 DIAGNOSIS — K648 Other hemorrhoids: Secondary | ICD-10-CM

## 2024-08-27 DIAGNOSIS — D123 Benign neoplasm of transverse colon: Secondary | ICD-10-CM | POA: Diagnosis not present

## 2024-08-27 DIAGNOSIS — Z1211 Encounter for screening for malignant neoplasm of colon: Secondary | ICD-10-CM | POA: Diagnosis not present

## 2024-08-27 DIAGNOSIS — K5730 Diverticulosis of large intestine without perforation or abscess without bleeding: Secondary | ICD-10-CM

## 2024-08-27 DIAGNOSIS — K635 Polyp of colon: Secondary | ICD-10-CM | POA: Diagnosis not present

## 2024-08-27 DIAGNOSIS — D128 Benign neoplasm of rectum: Secondary | ICD-10-CM | POA: Diagnosis not present

## 2024-08-27 MED ORDER — SODIUM CHLORIDE 0.9 % IV SOLN: 500.0000 mL | INTRAVENOUS | Status: AC

## 2024-08-27 NOTE — Progress Notes (Signed)
  Gastroenterology History and Physical   Primary Care Physician:  Rilla Baller, Michael [name]   Reason for Procedure:   History of colon polyps  Plan:    colonoscopy     HPI: Michael [name] is a 64 y.o. male  here for colonoscopy surveillance - numerous polyps in the past, last exam 03/2021 - 5 adenomas removed. Has had numerous polyps over the years. Patient denies any bowel symptoms at this time. No family history of colon cancer known. Otherwise feels well without any cardiopulmonary symptoms.   I have discussed risks / benefits of anesthesia and endoscopic procedure with Michael [name] and they wish to proceed with the exams as outlined today.   The patient was provided an opportunity to ask questions and all were answered. The patient agreed with the plan.    Past Medical History:  Diagnosis Date   Adenomatous colon polyp 02/2008   AK (actinic keratosis)    Allergy    Basal cell carcinoma of face    Mohs procedure   COPD (chronic obstructive pulmonary disease) (HCC)    COVID-19 virus infection 03/15/2022   COVID-19 virus infection 03/15/2022   Dyslipidemia 02/29/2016   Emphysema lung (HCC)    Esophagitis 02/13/2001   Gastritis 02/13/2001   GERD (gastroesophageal reflux disease)    Hemorrhoids    Hiatal hernia    HNP (herniated nucleus pulposus), cervical 09/30/2016   With radiculopathy, s/p anterior discectomy [facility]rey) 12/2016   Internal hemorrhoids    Reactive depression (situational)    Renal stones    Smoker     Past Surgical History:  Procedure Laterality Date   ANTERIOR CERVICAL DISCECTOMY  12/2016   C5/6 (Dr Unice)   COLONOSCOPY  02/14/2003   polyps, ext hemorrhoids   COLONOSCOPY  2009   polyps, rec rpt 5 yrs   COLONOSCOPY  2015   5 polyps, rec rpt 5 yrs Oma)   COLONOSCOPY  01/2019   multiple polyps including TAs, rpt 2-3 yrs Oma)   COLONOSCOPY  03/2021   TA/HP, rpt 3 yrs Oma)   ESOPHAGOGASTRODUODENOSCOPY  02/14/2003   esophagitis,  gastritis, duodenitis without hemorrhage   ESOPHAGOGASTRODUODENOSCOPY  01/2019   chronic gastritis, no H pylori Oma)   MOHS SURGERY     POLYPECTOMY     TONSILLECTOMY      Prior to Admission medications  Medication Sig Start Date End Date Taking? Authorizing Provider  TRELEGY ELLIPTA  200-62.5-25 MCG/ACT AEPB INHALE 1 PUFF INTO THE LUNGS DAILY IN THE AFTERNOON. 06/05/24  Yes Tamea Dedra LITTIE, Michael [name]  albuterol  (VENTOLIN  HFA) 108 [phone] Base) MCG/ACT inhaler Inhale 2 puffs into the lungs every 6 (six) hours as needed. 09/21/23   Tamea Dedra LITTIE, Michael [name]  Bioflavonoid Products (ESTER C PO) Take by mouth daily.    Provider, Historical, Michael [name]  Cholecalciferol (VITAMIN D3) 50 MCG (2000 UT) capsule Take 1 capsule (2,000 Units total) by mouth daily. 02/20/24   Rilla Baller, Michael [name]  cyanocobalamin 100 MCG tablet Take 100 mcg by mouth daily.     Provider, Historical, Michael [name]  dexlansoprazole  (DEXILANT ) 60 MG capsule Take 1 capsule (60 mg total) by mouth daily. 02/20/24   Rilla Baller, Michael [name]  fish oil-omega-3 fatty acids 1000 MG capsule Take 2 g by mouth daily.    Provider, Historical, Michael [name]  fluticasone  (FLONASE ) 50 MCG/ACT nasal spray SPRAY 2 SPRAYS INTO EACH NOSTRIL EVERY DAY 02/20/24   Rilla Baller, Michael [name]  HORIZANT 300 MG TBCR Take 1 tablet by mouth at bedtime. 09/17/19   Provider,  Historical, Michael [name]  Multiple Vitamin (MULTIVITAMIN) tablet Take 1 tablet by mouth daily.    Provider, Historical, Michael [name]  mupirocin  cream (BACTROBAN ) 2 % Apply 1 Application topically 2 (two) times daily. Formulate cream/ointment based on cost/insurance coverage 05/08/24   Rilla Baller, Michael [name]  nortriptyline  (PAMELOR ) 50 MG capsule Take 1 capsule (50 mg total) by mouth at bedtime. 02/20/24   Rilla Baller, Michael [name]  oxyCODONE -acetaminophen (PERCOCET/ROXICET) 5-325 MG tablet Take 1 tablet by mouth at bedtime. 06/24/18   Provider, Historical, Michael [name]  Polyethylene Glycol 3350  (MIRALAX PO) Take 117 g by mouth once. Patient not taking: No sig reported     Provider, Historical, Michael [name]  Probiotic Product (PROBIOTIC PO) Take by mouth.    Provider, Historical, Michael [name]  tiZANidine (ZANAFLEX) 2 MG tablet Take 2 tablets by mouth at bedtime. Takes as needed 08/04/17   Provider, Historical, Michael [name]  Zinc 30 MG TABS Take 1 tablet by mouth daily. Patient not taking: Reported on 08/27/2024    Provider, Historical, Michael [name]    Current Outpatient Medications  Medication Sig Dispense Refill   TRELEGY ELLIPTA  200-62.5-25 MCG/ACT AEPB INHALE 1 PUFF INTO THE LUNGS DAILY IN THE AFTERNOON. 60 each 11   albuterol  (VENTOLIN  HFA) 108 (90 Base) MCG/ACT inhaler Inhale 2 puffs into the lungs every 6 (six) hours as needed. 17 each 2   Bioflavonoid Products (ESTER C PO) Take by mouth daily.     Cholecalciferol (VITAMIN D3) 50 MCG (2000 UT) capsule Take 1 capsule (2,000 Units total) by mouth daily.     cyanocobalamin 100 MCG tablet Take 100 mcg by mouth daily.      dexlansoprazole  (DEXILANT ) 60 MG capsule Take 1 capsule (60 mg total) by mouth daily. 90 capsule 3   fish oil-omega-3 fatty acids 1000 MG capsule Take 2 g by mouth daily.     fluticasone  (FLONASE ) 50 MCG/ACT nasal spray SPRAY 2 SPRAYS INTO EACH NOSTRIL EVERY DAY 48 mL 3   HORIZANT 300 MG TBCR Take 1 tablet by mouth at bedtime.     Multiple Vitamin (MULTIVITAMIN) tablet Take 1 tablet by mouth daily.     mupirocin  cream (BACTROBAN ) 2 % Apply 1 Application topically 2 (two) times daily. Formulate cream/ointment based on cost/insurance coverage 30 g 0   nortriptyline  (PAMELOR ) 50 MG capsule Take 1 capsule (50 mg total) by mouth at bedtime. 90 capsule 3   oxyCODONE -acetaminophen (PERCOCET/ROXICET) 5-325 MG tablet Take 1 tablet by mouth at bedtime.     Polyethylene Glycol 3350  (MIRALAX PO) Take 117 g by mouth once. (Patient not taking: No sig reported)     Probiotic Product (PROBIOTIC PO) Take by mouth.     tiZANidine (ZANAFLEX) 2 MG tablet Take 2 tablets by mouth at bedtime. Takes as needed     Zinc 30 MG TABS Take 1 tablet by mouth  daily. (Patient not taking: Reported on 08/27/2024)     Current Facility-Administered Medications  Medication Dose Route Frequency Provider Last Rate Last Admin   0.9 %  sodium chloride  infusion  500 mL Intravenous Continuous Oleva Koo, Elspeth SQUIBB, Michael [name]        Allergies as of 08/27/2024   (No Known Allergies)    Family History  Problem Relation Age of Onset   Breast cancer Mother    Hypertension Father    Gout Father    GER disease Father    Colon polyps Father    Kidney failure Father        dialysis   Colon polyps Brother    Glaucoma  Brother        early onset   Throat cancer Brother    Transient ischemic attack Maternal Grandfather    Heart attack Paternal Grandfather    Heart disease Paternal Grandfather    Colon cancer Cousin    Diabetes Neg Hx    Esophageal cancer Neg Hx    Rectal cancer Neg Hx    Stomach cancer Neg Hx     Social History   Socioeconomic History   Marital status: Married    Spouse name: Not on file   Number of children: 0   Years of education: Not on file   Highest education level: Not on file  Occupational History   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  Tobacco Use   Smoking status: Every Day    Current packs/day: 1.00    Average packs/day: 1 pack/day for 40.0 years (40.0 ttl pk-yrs)    Types: Cigarettes   Smokeless tobacco: Never   Tobacco comments:    1 PPD- khj 09/21/2023  Vaping Use   Vaping status: Never Used  Substance and Sexual Activity   Alcohol use: No   Drug use: No   Sexual activity: Not on file  Other Topics Concern   Not on file  Social History Narrative   Caffeine: 1 cup coffee/yda, Dr Nunzio throughout the day   Lives with wife   Occupation: HVAC   Activity: work, armed forces training and education officer, mows yard   Diet: good water, fruits/vegetables daily   Social Drivers of Health   Tobacco Use: High Risk (08/27/2024)   Patient History    Smoking Tobacco Use: Every Day    Smokeless Tobacco Use: Never    Passive Exposure: Not on file   Financial Resource Strain: Low Risk (01/10/2024)   Overall Financial Resource Strain (CARDIA)    Difficulty of Paying Living Expenses: Not hard at all  Food Insecurity: No Food Insecurity (01/10/2024)   Hunger Vital Sign    Worried About Running Out of Food in the Last Year: Never true    Ran Out of Food in the Last Year: Never true  Transportation Needs: No Transportation Needs (01/10/2024)   PRAPARE - Administrator, Civil Service (Medical): No    Lack of Transportation (Non-Medical): No  Physical Activity: Insufficiently Active (01/10/2024)   Exercise Vital Sign    Days of Exercise per Week: 3 days    Minutes of Exercise per Session: 40 min  Stress: No Stress Concern Present (01/10/2024)   [employer] of Occupational Health - Occupational Stress Questionnaire    Feeling of Stress : Not at all  Social Connections: Socially Integrated (01/10/2024)   Social Connection and Isolation Panel    Frequency of Communication with Friends and Family: More than three times a week    Frequency of Social Gatherings with Friends and Family: More than three times a week    Attends Religious Services: More than 4 times per year    Active Member of Clubs or Organizations: Yes    Attends Banker Meetings: More than 4 times per year    Marital Status: Married  Catering Manager Violence: Not At Risk (01/10/2024)   Humiliation, Afraid, Rape, and Kick questionnaire    Fear of Current or Ex-Partner: No    Emotionally Abused: No    Physically Abused: No    Sexually Abused: No  Depression (PHQ2-9): Medium Risk (08/15/2024)   Depression (PHQ2-9)    PHQ-2 Score: 7  Alcohol Screen: Low Risk (01/10/2024)   Alcohol  Screen    Last Alcohol Screening Score (AUDIT): 0  Housing: Low Risk (01/10/2024)   Housing Stability Vital Sign    Unable to Pay for Housing in the Last Year: No    Number of Times Moved in the Last Year: 0    Homeless in the Last Year: No  Utilities: Not At Risk (01/10/2024)    AHC Utilities    Threatened with loss of utilities: No  Health Literacy: Adequate Health Literacy (01/10/2024)   B1300 Health Literacy    Frequency of need for help with medical instructions: Never    Review of Systems: All other review of systems negative except as mentioned in the HPI.  Physical Exam: Vital signs BP (!) 140/105   Pulse 100   Temp 98.4 F (36.9 C) (Temporal)   Ht 5' 8 (1.727 m)   Wt 154 lb (69.9 kg)   SpO2 96%   BMI 23.42 kg/m   General:   Alert,  Well-developed, pleasant and cooperative in NAD Lungs:  Clear throughout to auscultation.   Heart:  Regular rate and rhythm Abdomen:  Soft, nontender and nondistended.   Neuro/Psych:  Alert and cooperative. Normal mood and affect. A and O x 3  Michael Naval, Michael [name] [facility] Gastroenterology

## 2024-08-27 NOTE — Progress Notes (Signed)
 Sedate, gd SR, tolerated procedure well, VSS, report to RN

## 2024-08-27 NOTE — Op Note (Signed)
 [facility] Patient Name: Trasean Delima Procedure Date: 08/27/2024 11:07 AM MRN: [id number] Endoscopist: Elspeth P. Leigh , MD, [id number] Age: 64 Referring MD:  Date of Birth: Jan 05, 1961 Gender: Male Account #: [id number] Procedure:                Colonoscopy Indications:              High risk colon cancer surveillance: Personal                            history of colonic polyps - numerous polyps removed                            over the years, last exam 03/2021 - 5 polyps, exam                            prior to that - 10 polyps Medicines:                Monitored Anesthesia Care Procedure:                Pre-Anesthesia Assessment:                           - Prior to the procedure, a History and Physical                            was performed, and patient medications and                            allergies were reviewed. The patient's tolerance of                            previous anesthesia was also reviewed. The risks                            and benefits of the procedure and the sedation                            options and risks were discussed with the patient.                            All questions were answered, and informed consent                            was obtained. Prior Anticoagulants: The patient has                            taken no anticoagulant or antiplatelet agents. ASA                            Grade Assessment: II - A patient with mild systemic                            disease. After reviewing the risks and benefits,  the patient was deemed in satisfactory condition to                            undergo the procedure.                           After obtaining informed consent, the colonoscope                            was passed under direct vision. Throughout the                            procedure, the patient's blood pressure, pulse, and                            oxygen saturations were  monitored continuously. The                            Olympus Scope [id number] was introduced through the                            anus and advanced to the the cecum, identified by                            appendiceal orifice and ileocecal valve. The                            colonoscopy was performed without difficulty. The                            patient tolerated the procedure well. The quality                            of the bowel preparation was good. The ileocecal                            valve, appendiceal orifice, and rectum were                            photographed. Scope In: 11:19:34 AM Scope Out: 11:42:21 AM Scope Withdrawal Time: 0 hours 18 minutes 7 seconds  Total Procedure Duration: 0 hours 22 minutes 47 seconds  Findings:                 The perianal and digital rectal examinations were                            normal.                           A 2 to 3 mm polyp was found in the ascending colon.                            The polyp was sessile. The polyp was removed with a  cold snare. Resection and retrieval were complete.                           Three sessile polyps were found in the hepatic                            flexure. The polyps were 2 to 4 mm in size. These                            polyps were removed with a cold snare. Resection                            and retrieval were complete.                           Six sessile polyps were found in the transverse                            colon. The polyps were 2 to 4 mm in size. These                            polyps were removed with a cold snare. Resection                            and retrieval were complete.                           A 3 mm polyp was found in the splenic flexure. The                            polyp was sessile. The polyp was removed with a                            cold snare. Resection and retrieval were complete.                           A  5 to 6 mm polyp was found in the descending                            colon. The polyp was sessile. The polyp was removed                            with a cold snare. Resection and retrieval were                            complete.                           A 4 to 5 mm polyp was found in the recto-sigmoid                            colon. The polyp was sessile. The polyp was removed  with a cold snare. Resection and retrieval were                            complete.                           A 3 mm polyp was found in the rectum. The polyp was                            sessile. The polyp was removed with a cold snare.                            Resection and retrieval were complete.                           A few small-mouthed diverticula were found in the                            sigmoid colon.                           Internal hemorrhoids were found during retroflexion.                           The exam was otherwise without abnormality. Complications:            No immediate complications. Estimated blood loss:                            Minimal. Estimated Blood Loss:     Estimated blood loss was minimal. Impression:               - One 2 to 3 mm polyp in the ascending colon,                            removed with a cold snare. Resected and retrieved.                           - Three 2 to 4 mm polyps at the hepatic flexure,                            removed with a cold snare. Resected and retrieved.                           - Six 2 to 4 mm polyps in the transverse colon,                            removed with a cold snare. Resected and retrieved.                           - One 3 mm polyp at the splenic flexure, removed                            with a cold snare. Resected  and retrieved.                           - One 5 to 6 mm polyp in the descending colon,                            removed with a cold snare. Resected and retrieved.                            - One 4 to 5 mm polyp at the recto-sigmoid colon,                            removed with a cold snare. Resected and retrieved.                           - One 3 mm polyp in the rectum, removed with a cold                            snare. Resected and retrieved.                           - Diverticulosis in the sigmoid colon.                           - Internal hemorrhoids.                           - The examination was otherwise normal. Recommendation:           - Patient has a contact number available for                            emergencies. The signs and symptoms of potential                            delayed complications were discussed with the                            patient. Return to normal activities tomorrow.                            Written discharge instructions were provided to the                            patient.                           - Resume previous diet.                           - Continue present medications.                           - Await pathology results. Anticipate repeat  colonoscopy in 1 year                           - Recommend referral to genetic counseling given                            burden of polyps removed over the years if not yet                            done.                           - Tobacco cessation if patient continues to smoke [employer]. Jemiah Cuadra, MD 08/27/2024 11:50:49 AM This report has been signed electronically.

## 2024-08-27 NOTE — Progress Notes (Signed)
 Called to room to assist during endoscopic procedure.  Patient ID and intended procedure confirmed with present staff. Received instructions for my participation in the procedure from the performing physician.

## 2024-08-27 NOTE — Progress Notes (Signed)
 Pt's states no medical or surgical changes since previsit or office visit.

## 2024-08-27 NOTE — Patient Instructions (Signed)
-  Handout on polyps, hemorrhoids, diverticulosis, tobacco cessation provided. -await pathology results. -repeat colonoscopy  in 1 year for surveillance recommended. -Continue present medications. - recommend referral to genetic counseling   YOU HAD AN ENDOSCOPIC PROCEDURE TODAY AT THE [facility]:   Refer to the procedure report that was given to you for any specific questions about what was found during the examination.  If the procedure report does not answer your questions, please call your gastroenterologist to clarify.  If you requested that your care partner not be given the details of your procedure findings, then the procedure report has been included in a sealed envelope for you to review at your convenience later.  YOU SHOULD EXPECT: Some feelings of bloating in the abdomen. Passage of more gas than usual.  Walking can help get rid of the air that was put into your GI tract during the procedure and reduce the bloating. If you had a lower endoscopy (such as a colonoscopy or flexible sigmoidoscopy) you may notice spotting of blood in your stool or on the toilet paper. If you underwent a bowel prep for your procedure, you may not have a normal bowel movement for a few days.  Please Note:  You might notice some irritation and congestion in your nose or some drainage.  This is from the oxygen used during your procedure.  There is no need for concern and it should clear up in a day or so.  SYMPTOMS TO REPORT IMMEDIATELY:  Following lower endoscopy (colonoscopy or flexible sigmoidoscopy):  Excessive amounts of blood in the stool  Significant tenderness or worsening of abdominal pains  Swelling of the abdomen that is new, acute  Fever of 100F or higher   For urgent or emergent issues, a gastroenterologist can be reached at any hour by calling [phone]. Do not use MyChart messaging for urgent concerns.    DIET:  We do recommend a small meal at first, but then you may  proceed to your regular diet.  Drink plenty of fluids but you should avoid alcoholic beverages for 24 hours.  ACTIVITY:  You should plan to take it easy for the rest of today and you should NOT DRIVE or use heavy machinery until tomorrow (because of the sedation medicines used during the test).    FOLLOW UP: Our staff will call the number listed on your records the next business day following your procedure.  We will call around 7:15- 8:00 am to check on you and address any questions or concerns that you may have regarding the information given to you following your procedure. If we do not reach you, we will leave a message.     If any biopsies were taken you will be contacted by phone or by letter within the next 1-3 weeks.  Please call us  at [phone] if you have not heard about the biopsies in 3 weeks.    SIGNATURES/CONFIDENTIALITY: You and/or your care partner have signed paperwork which will be entered into your electronic medical record.  These signatures attest to the fact that that the information above on your After Visit Summary has been reviewed and is understood.  Full responsibility of the confidentiality of this discharge information lies with you and/or your care-partner.

## 2024-08-28 ENCOUNTER — Telehealth: Payer: Self-pay | Admitting: *Deleted

## 2024-08-28 NOTE — Telephone Encounter (Signed)
" °  Follow up Call-     08/27/2024   10:13 AM  Call back number  Post procedure Call Back phone  # [phone]  Permission to leave phone message Yes     Patient questions:  Do you have a fever, pain , or abdominal swelling? No. Pain Score  0 *  Have you tolerated food without any problems? Yes.    Have you been able to return to your normal activities? Yes.    Do you have any questions about your discharge instructions: Diet   No. Medications  No. Follow up visit  No.  Do you have questions or concerns about your Care? No.  Actions: * If pain score is 4 or above: No action needed, pain <4.   "

## 2024-08-29 ENCOUNTER — Encounter

## 2024-08-30 ENCOUNTER — Encounter: Payer: Self-pay | Admitting: Acute Care

## 2024-08-30 LAB — SURGICAL PATHOLOGY

## 2024-08-31 ENCOUNTER — Ambulatory Visit: Payer: Self-pay | Admitting: Gastroenterology

## 2024-08-31 ENCOUNTER — Encounter

## 2024-08-31 DIAGNOSIS — Z860100 Personal history of colon polyps, unspecified: Secondary | ICD-10-CM

## 2024-08-31 DIAGNOSIS — J4489 Other specified chronic obstructive pulmonary disease: Secondary | ICD-10-CM | POA: Diagnosis not present

## 2024-08-31 NOTE — Progress Notes (Signed)
 Daily Session Note  Patient Details  Name: Michael [name] MRN: [id number] Date of Birth: [birth year] Referring Provider:   Flowsheet Row Pulmonary Rehab from 08/15/2024 in [facility] Cardiac and Pulmonary Rehab  Referring Provider Dr. Dedra Sanders    Encounter Date: 08/31/2024  Check In:  Session Check In - 08/31/24 1118       Check-In   Supervising physician immediately available to respond to emergencies See telemetry face sheet for immediately available ER MD    Location [facility]-Cardiac & Pulmonary Rehab    Staff Present Burnard Davenport RN,BSN,MPA;Maxon Conetta BS, Exercise Physiologist;Joseph Hood RCP,RRT,BSRT;Noah Tickle, BS, Exercise Physiologist    Virtual Visit No    Medication changes reported     No    Fall or balance concerns reported    No    Tobacco Cessation No Change    Warm-up and Cool-down Performed on first and last piece of equipment    Resistance Training Performed Yes    VAD Patient? No    PAD/SET Patient? No      Pain Assessment   Currently in Pain? No/denies             Tobacco Use History[1]  Goals Met:  Proper associated with RPD/PD & O2 Sat Independence with exercise equipment Using PLB without cueing & demonstrates good technique Exercise tolerated well No report of concerns or symptoms today Strength training completed today  Goals Unmet:  Not Applicable  Comments: Pt able to follow exercise prescription today without complaint.  Will continue to monitor for progression.    Dr. Oneil Pinal is Medical Director for [facility] Cardiac Rehabilitation.  Dr. Fuad Aleskerov is Medical Director for [facility] Pulmonary Rehabilitation.    [1]  Social History Tobacco Use  Smoking Status Every Day   Current packs/day: 1.00   Average packs/day: 1 pack/day for 40.0 years (40.0 ttl pk-yrs)   Types: Cigarettes  Smokeless Tobacco Never  Tobacco Comments   1 PPD- khj 09/21/2023

## 2024-09-05 ENCOUNTER — Encounter

## 2024-09-07 ENCOUNTER — Encounter

## 2024-09-12 ENCOUNTER — Encounter

## 2024-09-14 ENCOUNTER — Encounter: Attending: Pulmonary Disease

## 2024-09-14 DIAGNOSIS — J4489 Other specified chronic obstructive pulmonary disease: Secondary | ICD-10-CM

## 2024-09-14 NOTE — Progress Notes (Signed)
 Daily Session Note  Patient Details  Name: Michael [name] MRN: [id number] Date of Birth: 1960/11/29 Referring Provider:   Flowsheet Row Pulmonary Rehab from 08/15/2024 in [facility] Cardiac and Pulmonary Rehab  Referring Provider Dr. Dedra Sanders    Encounter Date: 09/14/2024  Check In:  Session Check In - 09/14/24 1108       Check-In   Supervising physician immediately available to respond to emergencies See telemetry face sheet for immediately available ER MD    Location [facility]-Cardiac & Pulmonary Rehab    Staff Present Burnard Davenport RN,BSN,MPA;Maxon Conetta BS, Exercise Physiologist;Joseph Hood RCP,RRT,BSRT;Noah Tickle, [place], Exercise Physiologist    Virtual Visit No    Medication changes reported     No    Fall or balance concerns reported    No    Tobacco Cessation No Change    Warm-up and Cool-down Performed on first and last piece of equipment    Resistance Training Performed Yes    VAD Patient? No    PAD/SET Patient? No      Pain Assessment   Currently in Pain? No/denies             Tobacco Use History[1]  Goals Met:  Proper associated with RPD/PD & O2 Sat Independence with exercise equipment Using PLB without cueing & demonstrates good technique Exercise tolerated well No report of concerns or symptoms today Strength training completed today  Goals Unmet:  Not Applicable  Comments: Pt able to follow exercise prescription today without complaint.  Will continue to monitor for progression.    Dr. Oneil Pinal is Medical Director for [facility] Cardiac Rehabilitation.  Dr. Fuad Aleskerov is Medical Director for [facility] Pulmonary Rehabilitation.    [1]  Social History Tobacco Use  Smoking Status Every Day   Current packs/day: 1.00   Average packs/day: 1 pack/day for 40.0 years (40.0 ttl pk-yrs)   Types: Cigarettes  Smokeless Tobacco Never  Tobacco Comments   1 PPD- khj 09/21/2023

## 2024-09-19 ENCOUNTER — Encounter

## 2024-09-21 ENCOUNTER — Encounter

## 2024-09-26 ENCOUNTER — Encounter

## 2024-09-28 ENCOUNTER — Encounter

## 2024-10-03 ENCOUNTER — Encounter

## 2024-10-05 ENCOUNTER — Encounter

## 2024-10-10 ENCOUNTER — Encounter: Attending: Pulmonary Disease

## 2024-10-12 ENCOUNTER — Encounter

## 2024-10-17 ENCOUNTER — Encounter

## 2024-10-18 ENCOUNTER — Ambulatory Visit: Admitting: Pulmonary Disease

## 2024-10-18 ENCOUNTER — Encounter

## 2024-10-19 ENCOUNTER — Encounter

## 2024-10-24 ENCOUNTER — Encounter

## 2024-10-25 ENCOUNTER — Inpatient Hospital Stay: Admitting: Genetic Counselor

## 2024-10-25 ENCOUNTER — Inpatient Hospital Stay

## 2024-10-26 ENCOUNTER — Encounter

## 2024-10-31 ENCOUNTER — Encounter

## 2024-11-02 ENCOUNTER — Encounter

## 2024-11-07 ENCOUNTER — Encounter: Attending: Pulmonary Disease

## 2024-11-09 ENCOUNTER — Encounter

## 2024-11-14 ENCOUNTER — Encounter

## 2024-11-16 ENCOUNTER — Encounter

## 2024-11-21 ENCOUNTER — Encounter

## 2024-11-23 ENCOUNTER — Encounter

## 2024-11-28 ENCOUNTER — Encounter

## 2024-11-30 ENCOUNTER — Encounter

## 2024-12-05 ENCOUNTER — Encounter

## 2024-12-07 ENCOUNTER — Encounter: Attending: Pulmonary Disease

## 2024-12-12 ENCOUNTER — Encounter

## 2025-01-07 ENCOUNTER — Ambulatory Visit

## 2025-01-10 ENCOUNTER — Ambulatory Visit

## 2025-02-20 ENCOUNTER — Other Ambulatory Visit

## 2025-02-25 ENCOUNTER — Encounter: Admitting: Family Medicine
